# Patient Record
Sex: Female | Born: 1945
Health system: Southern US, Community
[De-identification: ages and names within clinical notes are randomized; demographics above are authoritative.]

## PROBLEM LIST (undated history)

## (undated) DIAGNOSIS — Z860100 Personal history of colon polyps, unspecified: Secondary | ICD-10-CM

## (undated) DIAGNOSIS — Z87448 Personal history of other diseases of urinary system: Secondary | ICD-10-CM

## (undated) DIAGNOSIS — F419 Anxiety disorder, unspecified: Secondary | ICD-10-CM

## (undated) DIAGNOSIS — I10 Essential (primary) hypertension: Secondary | ICD-10-CM

## (undated) DIAGNOSIS — E785 Hyperlipidemia, unspecified: Secondary | ICD-10-CM

## (undated) HISTORY — PX: DILATION AND CURETTAGE OF UTERUS: SHX78

## (undated) HISTORY — PX: EYE SURGERY: SHX253

## (undated) HISTORY — DX: Personal history of colon polyps, unspecified: Z86.0100

## (undated) HISTORY — DX: Anxiety disorder, unspecified: F41.9

## (undated) HISTORY — PX: TONSILLECTOMY: SHX5217

## (undated) HISTORY — DX: Personal history of colonic polyps: Z86.010

## (undated) HISTORY — DX: Personal history of other diseases of urinary system: Z87.448

## (undated) HISTORY — DX: Hyperlipidemia, unspecified: E78.5

---

## 1999-12-13 ENCOUNTER — Encounter: Payer: Self-pay | Admitting: Family Medicine

## 1999-12-13 ENCOUNTER — Encounter: Admission: RE | Admit: 1999-12-13 | Discharge: 1999-12-13 | Payer: Self-pay | Admitting: Family Medicine

## 2000-12-12 ENCOUNTER — Other Ambulatory Visit: Admission: RE | Admit: 2000-12-12 | Discharge: 2000-12-12 | Payer: Self-pay | Admitting: Family Medicine

## 2002-01-13 ENCOUNTER — Encounter: Admission: RE | Admit: 2002-01-13 | Discharge: 2002-01-13 | Payer: Self-pay | Admitting: Family Medicine

## 2002-01-13 ENCOUNTER — Encounter: Payer: Self-pay | Admitting: Family Medicine

## 2003-03-25 ENCOUNTER — Encounter: Payer: Self-pay | Admitting: Family Medicine

## 2003-03-25 ENCOUNTER — Encounter: Admission: RE | Admit: 2003-03-25 | Discharge: 2003-03-25 | Payer: Self-pay | Admitting: Family Medicine

## 2003-05-03 ENCOUNTER — Other Ambulatory Visit: Admission: RE | Admit: 2003-05-03 | Discharge: 2003-05-03 | Payer: Self-pay | Admitting: Family Medicine

## 2004-07-09 ENCOUNTER — Encounter: Admission: RE | Admit: 2004-07-09 | Discharge: 2004-07-09 | Payer: Self-pay | Admitting: Family Medicine

## 2004-07-18 LAB — HM PAP SMEAR: HM Pap smear: NORMAL

## 2004-07-28 ENCOUNTER — Encounter: Admission: RE | Admit: 2004-07-28 | Discharge: 2004-07-28 | Payer: Self-pay | Admitting: Family Medicine

## 2005-06-22 ENCOUNTER — Emergency Department (HOSPITAL_COMMUNITY): Admission: EM | Admit: 2005-06-22 | Discharge: 2005-06-22 | Payer: Self-pay | Admitting: Emergency Medicine

## 2006-04-02 HISTORY — PX: APPENDECTOMY: SHX54

## 2006-04-08 ENCOUNTER — Inpatient Hospital Stay (HOSPITAL_COMMUNITY): Admission: EM | Admit: 2006-04-08 | Discharge: 2006-04-09 | Payer: Self-pay | Admitting: Emergency Medicine

## 2011-12-11 ENCOUNTER — Encounter (HOSPITAL_COMMUNITY): Payer: Self-pay | Admitting: *Deleted

## 2011-12-11 ENCOUNTER — Emergency Department (HOSPITAL_COMMUNITY)
Admission: EM | Admit: 2011-12-11 | Discharge: 2011-12-11 | Disposition: A | Discharge status: Home / Self Care | Payer: Medicare Other | Attending: Emergency Medicine | Admitting: Emergency Medicine

## 2011-12-11 ENCOUNTER — Other Ambulatory Visit: Payer: Self-pay

## 2011-12-11 ENCOUNTER — Emergency Department (HOSPITAL_COMMUNITY): Payer: Medicare Other

## 2011-12-11 DIAGNOSIS — Z79899 Other long term (current) drug therapy: Secondary | ICD-10-CM | POA: Insufficient documentation

## 2011-12-11 DIAGNOSIS — R209 Unspecified disturbances of skin sensation: Secondary | ICD-10-CM | POA: Insufficient documentation

## 2011-12-11 DIAGNOSIS — I10 Essential (primary) hypertension: Secondary | ICD-10-CM | POA: Insufficient documentation

## 2011-12-11 DIAGNOSIS — J329 Chronic sinusitis, unspecified: Secondary | ICD-10-CM | POA: Insufficient documentation

## 2011-12-11 HISTORY — DX: Essential (primary) hypertension: I10

## 2011-12-11 LAB — URINALYSIS, ROUTINE W REFLEX MICROSCOPIC
Bilirubin Urine: NEGATIVE
Ketones, ur: NEGATIVE mg/dL
Nitrite: NEGATIVE
Specific Gravity, Urine: 1.006 (ref 1.005–1.030)
Urobilinogen, UA: 0.2 mg/dL (ref 0.0–1.0)

## 2011-12-11 LAB — DIFFERENTIAL
Basophils Relative: 1 % (ref 0–1)
Eosinophils Absolute: 0.1 10*3/uL (ref 0.0–0.7)
Eosinophils Relative: 1 % (ref 0–5)
Lymphs Abs: 1.8 10*3/uL (ref 0.7–4.0)
Monocytes Absolute: 0.4 10*3/uL (ref 0.1–1.0)
Monocytes Relative: 5 % (ref 3–12)
Neutrophils Relative %: 71 % (ref 43–77)

## 2011-12-11 LAB — CBC
HCT: 40.4 % (ref 36.0–46.0)
Hemoglobin: 13.2 g/dL (ref 12.0–15.0)
MCH: 28.5 pg (ref 26.0–34.0)
MCHC: 32.7 g/dL (ref 30.0–36.0)
MCV: 87.3 fL (ref 78.0–100.0)
RBC: 4.63 MIL/uL (ref 3.87–5.11)

## 2011-12-11 LAB — BASIC METABOLIC PANEL
BUN: 13 mg/dL (ref 6–23)
Calcium: 9.5 mg/dL (ref 8.4–10.5)
Creatinine, Ser: 0.85 mg/dL (ref 0.50–1.10)
GFR calc Af Amer: 81 mL/min — ABNORMAL LOW (ref >90)
GFR calc non Af Amer: 70 mL/min — ABNORMAL LOW (ref >90)
Glucose, Bld: 119 mg/dL — ABNORMAL HIGH (ref 70–99)

## 2011-12-11 LAB — CARDIAC PANEL(CRET KIN+CKTOT+MB+TROPI): CK, MB: 1.6 ng/mL (ref 0.3–4.0)

## 2011-12-11 LAB — PROTIME-INR
INR: 0.95 (ref 0.00–1.49)
Prothrombin Time: 12.9 seconds (ref 11.6–15.2)

## 2011-12-11 LAB — APTT: aPTT: 41 seconds — ABNORMAL HIGH (ref 24–37)

## 2011-12-11 LAB — URINE MICROSCOPIC-ADD ON

## 2011-12-11 MED ORDER — LABETALOL HCL 5 MG/ML IV SOLN
10.0000 mg | Freq: Once | INTRAVENOUS | Status: DC
  Filled 2011-12-11: qty 4

## 2011-12-11 MED ORDER — LABETALOL HCL 5 MG/ML IV SOLN
20.0000 mg | Freq: Once | INTRAVENOUS | Status: AC
  Administered 2011-12-11: 20 mg via INTRAVENOUS
  Filled 2011-12-11: qty 4

## 2011-12-11 MED ORDER — FLUTICASONE PROPIONATE 50 MCG/ACT NA SUSP: 2.0000 | Freq: Every day | NASAL | Status: DC

## 2011-12-11 MED ORDER — AZITHROMYCIN 250 MG PO TABS: 250.0000 mg | ORAL_TABLET | Freq: Every day | ORAL | Status: AC

## 2011-12-11 MED ORDER — SODIUM CHLORIDE 0.9 % IV SOLN
Freq: Once | INTRAVENOUS | Status: AC
  Administered 2011-12-11: 11:00:00 via INTRAVENOUS

## 2011-12-11 MED ORDER — GADOBENATE DIMEGLUMINE 529 MG/ML IV SOLN
15.0000 mL | Freq: Once | INTRAVENOUS | Status: AC | PRN
  Administered 2011-12-11: 15 mL via INTRAVENOUS

## 2011-12-11 MED ORDER — MOMETASONE FUROATE 50 MCG/ACT NA SUSP: 2.0000 | Freq: Every day | NASAL | Status: DC

## 2011-12-11 MED ORDER — LISINOPRIL 20 MG PO TABS: 10.0000 mg | ORAL_TABLET | Freq: Every day | ORAL | Status: DC

## 2011-12-11 NOTE — ED Notes (Signed)
Pt states woke up this am with "fogginess", states "I feel funny"; pt states went to work and they checked her b/p and it was 212/122, no facial drooping noted, pt ambulatory, no other complaints noted. Pt states has been off her b/p meds for 18yrs.

## 2011-12-11 NOTE — ED Notes (Signed)
Waiting 10 minutes before sending pt home to record response to medication. Pt agreeable to plan.

## 2011-12-11 NOTE — ED Provider Notes (Signed)
History     CSN: [id number]  Arrival date & time 12/11/11  8295   First MD Initiated Contact with Patient 12/11/11 [phone]      Chief Complaint  Patient presents with  . Hypertension    212/122    (Consider location/radiation/quality/duration/timing/severity/associated sxs/prior treatment) HPI Pt states that on her way to work she began to feel unwell. States she felt "foggy" or "spacey". She noticed that at 7 am she had paraesthesias in L hand and then in left foot. No focal weakness, dizziness, N/V, HA, fever, neck pain, CP, SOB. Paraesthesias have resolved in foot and only very mild sensory difference noted in hand. Pt states she stopped taking her BP meds for previously diagnosed HTN 6 years aog and does not remember the name of the medicine.  Past Medical History  Diagnosis Date  . Hypertension     Past Surgical History  Procedure Date  . Appendectomy     No family history on file.  History  Substance Use Topics  . Smoking status: Former Games developer  . Smokeless tobacco: Not on file  . Alcohol Use: Yes     socially    OB History    Grav Para Term Preterm Abortions TAB SAB Ect Mult Living                  Review of Systems  Constitutional: Negative for fever and chills.  HENT: Negative for neck pain.   Eyes: Negative for visual disturbance.  Respiratory: Negative for chest tightness and shortness of breath.   Cardiovascular: Negative for chest pain and palpitations.  Gastrointestinal: Negative for nausea, vomiting, abdominal pain and diarrhea.  Skin: Negative for rash and wound.  Neurological: Positive for numbness. Negative for dizziness, tremors, seizures, syncope, facial asymmetry, speech difficulty, weakness, light-headedness and headaches.    Allergies  Review of patient's allergies indicates no known allergies.  Home Medications   Current Outpatient Rx  Name Route Sig Dispense Refill  . AZITHROMYCIN 250 MG PO TABS Oral Take 1 tablet (250 mg total) by  mouth daily. Take first 2 tablets together, then 1 every day until finished. 6 tablet 0  . FLUTICASONE PROPIONATE 50 MCG/ACT NA SUSP Nasal Place 2 sprays into the nose daily. 16 g 2  . LISINOPRIL 20 MG PO TABS Oral Take 0.5 tablets (10 mg total) by mouth daily. 30 tablet 0  . MOMETASONE FUROATE 50 MCG/ACT NA SUSP Nasal Place 2 sprays into the nose daily. 17 g 12    BP 210/103  Pulse 65  Temp(Src) 98.2 F (36.8 C) (Oral)  Resp 19  SpO2 97%  Physical Exam  Nursing note and vitals reviewed. Constitutional: She is oriented to person, place, and time. She appears well-developed and well-nourished. No distress.  HENT:  Head: Normocephalic and atraumatic.  Mouth/Throat: Oropharynx is clear and moist.  Eyes: EOM are normal. Pupils are equal, round, and reactive to light.  Neck: Normal range of motion. Neck supple.  Cardiovascular: Normal rate and regular rhythm.   Pulmonary/Chest: Effort normal and breath sounds normal. No respiratory distress. She has no wheezes. She has no rales.  Abdominal: Soft. Bowel sounds are normal. There is no tenderness. There is no rebound and no guarding.  Musculoskeletal: Normal range of motion. She exhibits no edema and no tenderness.  Neurological: She is alert and oriented to person, place, and time.       5/5 motor in all ext. Mildly decreased sensation in L hand compared to R hand. No  other sensory deficits. Finger to nose intact. CN II-XII intact.   Skin: Skin is warm and dry. No rash noted. No erythema.  Psychiatric: She has a normal mood and affect. Her behavior is normal.    ED Course  Procedures (including critical care time)  Labs Reviewed  BASIC METABOLIC PANEL - Abnormal; Notable for the following:    Glucose, Bld 119 (*)    GFR calc non Af Amer 70 (*)    GFR calc Af Amer 81 (*)    All other components within normal limits  URINALYSIS, ROUTINE W REFLEX MICROSCOPIC - Abnormal; Notable for the following:    Hgb urine dipstick MODERATE (*)     Protein, ur 100 (*)    All other components within normal limits  APTT - Abnormal; Notable for the following:    aPTT 41 (*)    All other components within normal limits  URINE MICROSCOPIC-ADD ON - Abnormal; Notable for the following:    Squamous Epithelial / LPF FEW (*)    All other components within normal limits  CBC  DIFFERENTIAL  CARDIAC PANEL(CRET KIN+CKTOT+MB+TROPI)  PROTIME-INR   Ct Head Wo Contrast  12/11/2011  *RADIOLOGY REPORT*  Clinical Data: Hand numbness.  History of hypertension.  CT HEAD WITHOUT CONTRAST  Technique:  Contiguous axial images were obtained from the base of the skull through the vertex without contrast.  Comparison: No priors.  Findings: No acute intracranial abnormalities.  Specifically, no focal mass, mass effect, hydrocephalus or abnormal intra or extra- axial fluid collections.  No displaced skull fractures are identified.  Visualized paranasal sinuses and mastoids are well pneumatized, with the exception of a small amount of mucosal thickening in the ethmoid sinuses bilaterally, and tiny air fluid levels in the dependent portion of the right maxillary and left sphenoid sinuses.  IMPRESSION: 1.  No acute intracranial abnormalities. 2.  Small amount of mucosal thickening in the ethmoid sinuses and tiny air fluid levels in the right maxillary and left sphenoid sinus.  Clinical correlation for signs of symptoms of acute sinusitis may be warranted.  Original Report Authenticated By: Florencia Reasons, M.D.   Mr Laqueta Jean Wo Contrast  12/11/2011  *RADIOLOGY REPORT*  Clinical Data: 66 year old female with hypertension, left hand numbness.  MRI HEAD WITHOUT AND WITH CONTRAST  Technique:  Multiplanar, multiecho pulse sequences of the brain and surrounding structures were obtained according to standard protocol without and with intravenous contrast  Contrast: 15mL MULTIHANCE GADOBENATE DIMEGLUMINE 529 MG/ML IV SOLN  Comparison: Head CT without contrast 12/11/2011.  Findings:  No restricted diffusion to suggest acute infarction.  No midline shift, mass effect, evidence of mass lesion, ventriculomegaly, extra-axial collection or acute intracranial hemorrhage.  Cervicomedullary junction and pituitary are within normal limits.  Major intracranial vascular flow voids are preserved.  Numerous small scattered foci of T2 and FLAIR hyperintensity in the cerebral white matter, mostly subcortical. No abnormal enhancement identified.  Negative visualized cervical spine.  The deep gray matter nuclei, brainstem, and cerebellum are within normal limits.  Postoperative changes to the globes, otherwise negative orbit soft tissues.  The mastoids are clear.  Scattered paranasal sinus fluid levels and mucosal thickening.  Normal bone marrow signal. Negative scalp soft tissues.  IMPRESSION: 1. No acute intracranial abnormality. 2.  Age advanced but nonspecific cerebral white matter signal changes without enhancement.  Favor chronic small vessel disease. 3.  Paranasal sinus inflammatory changes with fluid levels suggesting acute sinusitis.  Original Report Authenticated By: Harley Hallmark, M.D.  1. Uncontrolled hypertension   2. Sinusitis      Date: 12/11/2011  Rate: 79  Rhythm: normal sinus rhythm  QRS Axis: normal  Intervals: normal  ST/T Wave abnormalities: normal  Conduction Disutrbances:none  Narrative Interpretation:   Old EKG Reviewed: unchanged    MDM   Discussed with Dr Lyman Speller. If MRI normal OK to D/C home and f/u with PMD     MRI brain with no acute findings. Sinusitis present. Pt feeling well. Laughing in room. Will start low dose lisinopril and have f/u with PMD to have blood pressure checked and adjust medications. Pt instructed to return immediately for worsening symptoms or any concerns   Loren Racer, MD 12/11/11 1542

## 2011-12-11 NOTE — ED Notes (Signed)
Patient transported to MRI 

## 2011-12-11 NOTE — ED Notes (Signed)
Unable to obtain labs with IV start. Phlebotomy at bedside.

## 2011-12-11 NOTE — Discharge Instructions (Signed)
Call and make an appointment with your Primary MD to follow up on your blood pressure and adjust medications as needed. Return immediately for headaches, weakness, uncontrolled vomiting or any concerns.   Hypertension Information As your heart beats, it forces blood through your arteries. This force is your blood pressure. If the pressure is too high, it is called hypertension (HTN) or high blood pressure. HTN is dangerous because you may have it and not know it. High blood pressure may mean that your heart has to work harder to pump blood. Your arteries may be narrow or stiff. The extra work puts you at risk for heart disease, stroke, and other problems.  Blood pressure consists of two numbers, a higher number over a lower, 110/72, for example. It is stated as "110 over 72." The ideal is below 120 for the top number (systolic) and under 80 for the bottom (diastolic).  You should pay close attention to your blood pressure if you have certain conditions such as:  Heart failure.   Prior heart attack.   Diabetes   Chronic kidney disease.   Prior stroke.   Multiple risk factors for heart disease.  To see if you have HTN, your blood pressure should be measured while you are seated with your arm held at the level of the heart. It should be measured at least twice. A one-time elevated blood pressure reading (especially in the Emergency Department) does not mean that you need treatment. There may be conditions in which the blood pressure is different between your right and left arms. It is important to see your caregiver soon for a recheck. Most people have essential hypertension which means that there is not a specific cause. This type of high blood pressure may be lowered by changing lifestyle factors such as:  Stress.   Smoking.   Lack of exercise.   Excessive weight.   Drug/tobacco/alcohol use.   Eating less salt.  Most people do not have symptoms from high blood pressure until it has  caused damage to the body. Effective treatment can often prevent, delay or reduce that damage. TREATMENT  Treatment for high blood pressure, when a cause has been identified, is directed at the cause. There are a large number of medications to treat HTN. These fall into several categories, and your caregiver will help you select the medicines that are best for you. Medications may have side effects. You should review side effects with your caregiver. If your blood pressure stays high after you have made lifestyle changes or started on medicines,   Your medication(s) may need to be changed.   Other problems may need to be addressed.   Be certain you understand your prescriptions, and know how and when to take your medicine.   Be sure to follow up with your caregiver within the time frame advised (usually within two weeks) to have your blood pressure rechecked and to review your medications.   If you are taking more than one medicine to lower your blood pressure, make sure you know how and at what times they should be taken. Taking two medicines at the same time can result in blood pressure that is too low.  Document Released: 10/22/2005 Document Revised: 05/01/2011 Document Reviewed: 10/29/2007 [facility] Patient Information 2012 [place], [place].  Sinusitis Sinuses are air pockets within the bones of your face. The growth of bacteria within a sinus leads to infection. The infection prevents the sinuses from draining. This infection is called sinusitis. SYMPTOMS  There will  be different areas of pain depending on which sinuses have become infected.  The maxillary sinuses often produce pain beneath the eyes.   Frontal sinusitis may cause pain in the middle of the forehead and above the eyes.  Other problems (symptoms) include:  Toothaches.   Colored, pus-like (purulent) drainage from the nose.   Swelling, warmth, and tenderness over the sinus areas may be signs of infection.  TREATMENT    Sinusitis is most often determined by an exam.X-rays may be taken. If x-rays have been taken, make sure you obtain your results or find out how you are to obtain them. Your caregiver may give you medications (antibiotics). These are medications that will help kill the bacteria causing the infection. You may also be given a medication (decongestant) that helps to reduce sinus swelling.  HOME CARE INSTRUCTIONS   Only take over-the-counter or prescription medicines for pain, discomfort, or fever as directed by your caregiver.   Drink extra fluids. Fluids help thin the mucus so your sinuses can drain more easily.   Applying either moist heat or ice packs to the sinus areas may help relieve discomfort.   Use saline nasal sprays to help moisten your sinuses. The sprays can be found at your local drugstore.  SEEK IMMEDIATE MEDICAL CARE IF:  You have a fever.   You have increasing pain, severe headaches, or toothache.   You have nausea, vomiting, or drowsiness.   You develop unusual swelling around the face or trouble seeing.  MAKE SURE YOU:   Understand these instructions.   Will watch your condition.   Will get help right away if you are not doing well or get worse.  Document Released: 08/19/2005 Document Revised: 08/08/2011 Document Reviewed: 03/18/2007 [facility] Patient Information 2012 [place], [place].

## 2012-01-02 ENCOUNTER — Other Ambulatory Visit: Payer: Self-pay | Admitting: *Deleted

## 2012-01-02 DIAGNOSIS — G459 Transient cerebral ischemic attack, unspecified: Secondary | ICD-10-CM

## 2012-01-03 ENCOUNTER — Encounter (INDEPENDENT_AMBULATORY_CARE_PROVIDER_SITE_OTHER): Payer: BC Managed Care – PPO

## 2012-01-03 DIAGNOSIS — R209 Unspecified disturbances of skin sensation: Secondary | ICD-10-CM

## 2012-01-03 DIAGNOSIS — G459 Transient cerebral ischemic attack, unspecified: Secondary | ICD-10-CM

## 2012-03-24 ENCOUNTER — Other Ambulatory Visit: Payer: Self-pay | Admitting: Gastroenterology

## 2012-11-19 ENCOUNTER — Other Ambulatory Visit (INDEPENDENT_AMBULATORY_CARE_PROVIDER_SITE_OTHER): Payer: Medicare Other

## 2012-11-19 ENCOUNTER — Ambulatory Visit: Payer: Self-pay | Admitting: Physician Assistant

## 2012-11-19 DIAGNOSIS — Z79899 Other long term (current) drug therapy: Secondary | ICD-10-CM

## 2012-11-19 DIAGNOSIS — I10 Essential (primary) hypertension: Secondary | ICD-10-CM

## 2012-11-19 DIAGNOSIS — E559 Vitamin D deficiency, unspecified: Secondary | ICD-10-CM

## 2012-11-19 DIAGNOSIS — E782 Mixed hyperlipidemia: Secondary | ICD-10-CM

## 2012-11-19 LAB — CBC WITH DIFFERENTIAL/PLATELET
Basophils Absolute: 0.1 10*3/uL (ref 0.0–0.1)
Basophils Relative: 1 % (ref 0–1)
Eosinophils Absolute: 0.3 10*3/uL (ref 0.0–0.7)
Eosinophils Relative: 4 % (ref 0–5)
MCH: 27.3 pg (ref 26.0–34.0)
MCHC: 33.5 g/dL (ref 30.0–36.0)
MCV: 81.5 fL (ref 78.0–100.0)
Neutrophils Relative %: 56 % (ref 43–77)
Platelets: 402 10*3/uL — ABNORMAL HIGH (ref 150–400)
RDW: 15.4 % (ref 11.5–15.5)

## 2012-11-19 LAB — COMPREHENSIVE METABOLIC PANEL
AST: 13 U/L (ref 0–37)
BUN: 23 mg/dL (ref 6–23)
CO2: 26 mEq/L (ref 19–32)
Calcium: 10.2 mg/dL (ref 8.4–10.5)
Chloride: 104 mEq/L (ref 96–112)
Creat: 1.14 mg/dL — ABNORMAL HIGH (ref 0.50–1.10)
Total Bilirubin: 0.4 mg/dL (ref 0.3–1.2)

## 2012-11-19 LAB — LIPID PANEL
Cholesterol: 172 mg/dL (ref 0–200)
HDL: 45 mg/dL (ref >39)
Total CHOL/HDL Ratio: 3.8 Ratio
VLDL: 58 mg/dL — ABNORMAL HIGH (ref 0–40)

## 2012-11-24 ENCOUNTER — Encounter: Payer: Self-pay | Admitting: Family Medicine

## 2012-11-24 DIAGNOSIS — G459 Transient cerebral ischemic attack, unspecified: Secondary | ICD-10-CM | POA: Insufficient documentation

## 2012-11-24 DIAGNOSIS — E559 Vitamin D deficiency, unspecified: Secondary | ICD-10-CM | POA: Insufficient documentation

## 2012-11-24 DIAGNOSIS — Z86010 Personal history of colonic polyps: Secondary | ICD-10-CM | POA: Insufficient documentation

## 2012-11-24 DIAGNOSIS — E785 Hyperlipidemia, unspecified: Secondary | ICD-10-CM | POA: Insufficient documentation

## 2012-11-24 DIAGNOSIS — Z87448 Personal history of other diseases of urinary system: Secondary | ICD-10-CM | POA: Insufficient documentation

## 2012-11-24 DIAGNOSIS — I10 Essential (primary) hypertension: Secondary | ICD-10-CM | POA: Insufficient documentation

## 2012-11-24 DIAGNOSIS — F410 Panic disorder [episodic paroxysmal anxiety] without agoraphobia: Secondary | ICD-10-CM | POA: Insufficient documentation

## 2012-11-25 ENCOUNTER — Ambulatory Visit (INDEPENDENT_AMBULATORY_CARE_PROVIDER_SITE_OTHER): Payer: 59 | Admitting: Physician Assistant

## 2012-11-25 ENCOUNTER — Encounter: Payer: Self-pay | Admitting: Physician Assistant

## 2012-11-25 VITALS — BP 142/76 | HR 80 | Temp 97.9°F | Resp 18 | Ht 63.5 in | Wt 172.0 lb

## 2012-11-25 DIAGNOSIS — A499 Bacterial infection, unspecified: Secondary | ICD-10-CM

## 2012-11-25 DIAGNOSIS — E785 Hyperlipidemia, unspecified: Secondary | ICD-10-CM

## 2012-11-25 DIAGNOSIS — R739 Hyperglycemia, unspecified: Secondary | ICD-10-CM

## 2012-11-25 DIAGNOSIS — E559 Vitamin D deficiency, unspecified: Secondary | ICD-10-CM

## 2012-11-25 DIAGNOSIS — J069 Acute upper respiratory infection, unspecified: Secondary | ICD-10-CM

## 2012-11-25 DIAGNOSIS — I10 Essential (primary) hypertension: Secondary | ICD-10-CM

## 2012-11-25 DIAGNOSIS — R7309 Other abnormal glucose: Secondary | ICD-10-CM

## 2012-11-25 DIAGNOSIS — B9689 Other specified bacterial agents as the cause of diseases classified elsewhere: Secondary | ICD-10-CM

## 2012-11-25 DIAGNOSIS — G459 Transient cerebral ischemic attack, unspecified: Secondary | ICD-10-CM

## 2012-11-25 MED ORDER — AMOXICILLIN-POT CLAVULANATE 875-125 MG PO TABS: 1.0000 | ORAL_TABLET | Freq: Two times a day (BID) | ORAL | Status: DC

## 2012-11-25 NOTE — Progress Notes (Signed)
Patient ID: Sherry Kent MRN: [id number], DOB: [birth year], 67 y.o. Date of Encounter: @DATE @  Chief Complaint:  Chief Complaint  Patient presents with  . f/u for med refills  had labs last week    HPI: 68 y.o. year old female  presents for routine F/U. Also, reports nasal congestion.  1. URI: For 2-3 weeks, when wakes in morning has nasal congeston. Showers... This loosens mucus. Blows out thick dark yellow/green mucus.  O/w during day does not get out much mucus. No chest cong, ST, F/C.  2. HTN: Taking all meds as directed. No LE Edema or adverse effects.  3. HLD: Taking Crestor as directed. No myalgias.  4. Vit. D Def: Is taking 2,000 IU QD.  Results for orders placed in visit on 11/24/12  HM MAMMOGRAPHY      Result Value Range   HM Mammogram normal    HM DEXA SCAN      Result Value Range   HM Dexa Scan osteopenia    HM PAP SMEAR      Result Value Range   HM Pap smear normal    HM COLONOSCOPY      Result Value Range   HM Colonoscopy Dr Sherin Quarry         Past Medical History  Diagnosis Date  . Hypertension   . Hyperlipidemia   . Anxiety   . History of colon polyps   . Vitamin D deficiency   . H/O hematuria   . Hx-TIA (transient ischemic attack)      Home Meds: Current Outpatient Prescriptions on File Prior to Visit  Medication Sig Dispense Refill  . amLODipine (NORVASC) 5 MG tablet Take 5 mg by mouth daily.      . Cholecalciferol (VITAMIN D3) 2000 UNITS TABS Take 2,000 Int'l Units by mouth daily.      . hydrochlorothiazide (HYDRODIURIL) 25 MG tablet Take 25 mg by mouth daily.      Marland Kitchen lisinopril (PRINIVIL,ZESTRIL) 20 MG tablet Take 0.5 tablets (10 mg total) by mouth daily.  30 tablet  0  . rosuvastatin (CRESTOR) 20 MG tablet Take 20 mg by mouth daily.      . fluticasone (FLONASE) 50 MCG/ACT nasal spray Place 2 sprays into the nose daily.  16 g  2  . mometasone (NASONEX) 50 MCG/ACT nasal spray Place 2 sprays into the nose daily.  17 g  12   No current  facility-administered medications on file prior to visit.    Allergies: No Known Allergies  History   Social History  . Marital Status: Married    Spouse Name: N/A    Number of Children: N/A  . Years of Education: N/A   Occupational History  . Not on file.   Social History Main Topics  . Smoking status: Former Games developer  . Smokeless tobacco: Never Used  . Alcohol Use: Yes     Comment: socially  . Drug Use: No  . Sexually Active: Not on file   Other Topics Concern  . Not on file   Social History Narrative  . No narrative on file    Family History  Problem Relation Age of Onset  . Heart disease Father   . Cancer Brother     colon     Review of Systems: Constitutional: negative for chills, fever, night sweats, weight changes, or fatigue  HEENT: negative for vision changes, hearing loss, rhinorrhea, ST, epistaxis, or sinus pressure Cardiovascular: negative for chest pain or palpitations. No new/increased shortness  of breath or dyspnea on exertion Respiratory: negative for hemoptysis, wheezing, shortness of breath, or cough Abdominal: negative for abdominal pain, nausea, vomiting, diarrhea, or constipation Dermatological: negative for rash or concerning skin lesions Neurologic: negative for headache, dizziness, or syncope All other systems reviewed and are otherwise negative with the exception to those above and in the HPI.   Physical Exam: Blood pressure 142/76, pulse 80, temperature 97.9 F (36.6 C), temperature source Oral, resp. rate 18, height 5' 3.5" (1.613 m), weight 172 lb (78.019 kg)., Body mass index is 29.99 kg/(m^2). General: Moderate obese WF. in no acute distress. Head: Normocephalic, atraumatic, eyes without discharge, sclera non-icteric, nares are without discharge. Bilateral auditory canals clear, TM's are without perforation, pearly grey and translucent with reflective cone of light bilaterally. Oral cavity moist, posterior pharynx without exudate,  erythema, peritonsillar abscess, or post nasal drip.Sinuses with no tenderness with percussion.  Neck: Supple. No thyromegaly. Full ROM. No lymphadenopathy. Lungs: Clear bilaterally to auscultation without wheezes, rales, or rhonchi. Breathing is unlabored. Heart: RRR with S1 S2. No murmurs, rubs, or gallops. Abdomen: Soft, non-tender, non-distended with normoactive bowel sounds. No hepatomegaly. No rebound/guarding. No obvious abdominal masses. Musculoskeletal:  Strength and tone normal for age. Extremities/Skin: Warm and dry. No clubbing or cyanosis. No edema. No rashes or suspicious lesions. Neuro: Alert and oriented X 3. Moves all extremities spontaneously. Gait is normal. CNII-XII grossly in tact. Psych:  Responds to questions appropriately with a normal affect.   Labs:   ASSESSMENT AND PLAN:  67 y.o. year old female with  1. Bacterial upper respiratory infection  - amoxicillin-clavulanate (AUGMENTIN) 875-125 MG per tablet; Take 1 tablet by mouth 2 (two) times daily.  Dispense: 20 tablet; Refill: 0 Cont Nasonex prn.  2. Hypertension BP slightly high today but has been at goal last OV. Cont current meds now and monitor. CMET last week nml.  3. Hyperlipidemia LDL and HDL at goal. Cont Crestor at current dose. Discussed high trigs.Marland Kitchendecrease carbs as below  4. Hyperglycemia Gave and reviewed Carb Counting Handout at length today. She says she eats a lot of the items on that list and can definitely make a lot of changes. At next lab will check A1C.  5. TIA (transient ischemic attack)   6. Vitamin D deficiency Level WNL on current dose at lab last week. Cont current dose of 200 IU QD.  7. DEXA/BMD:Last was 2005. Discussed f/u @ LOV. But today pt says she never had f/u. Encouraged her to f/u today.  8. Mammogram: Last was >10 years ago. Discussed need to f/u with pt. Defers referral at this time.  9. Colonoscopy: 03/24/12..Polyp..repeat 5 years  10. Zostavax: Told pt to check  cost with insurance a LOV. She says she called but insurance co never returned her call.   ROV 6 months.  Signed, [address] [place], [place], [facility] 11/25/2012 2:50 PM

## 2013-01-20 ENCOUNTER — Other Ambulatory Visit: Payer: Self-pay | Admitting: Physician Assistant

## 2013-01-20 NOTE — Telephone Encounter (Signed)
Medication refilled per protocol. 

## 2013-01-26 ENCOUNTER — Encounter: Payer: Self-pay | Admitting: Physician Assistant

## 2013-05-27 ENCOUNTER — Encounter: Payer: Self-pay | Admitting: Physician Assistant

## 2013-05-27 ENCOUNTER — Ambulatory Visit (INDEPENDENT_AMBULATORY_CARE_PROVIDER_SITE_OTHER): Payer: 59 | Admitting: Physician Assistant

## 2013-05-27 VITALS — BP 140/80 | HR 72 | Temp 98.3°F | Resp 18 | Ht 63.0 in | Wt 173.0 lb

## 2013-05-27 DIAGNOSIS — R7309 Other abnormal glucose: Secondary | ICD-10-CM

## 2013-05-27 DIAGNOSIS — F329 Major depressive disorder, single episode, unspecified: Secondary | ICD-10-CM

## 2013-05-27 DIAGNOSIS — R7302 Impaired glucose tolerance (oral): Secondary | ICD-10-CM | POA: Insufficient documentation

## 2013-05-27 DIAGNOSIS — Z86010 Personal history of colonic polyps: Secondary | ICD-10-CM

## 2013-05-27 DIAGNOSIS — E785 Hyperlipidemia, unspecified: Secondary | ICD-10-CM

## 2013-05-27 DIAGNOSIS — I10 Essential (primary) hypertension: Secondary | ICD-10-CM

## 2013-05-27 DIAGNOSIS — E559 Vitamin D deficiency, unspecified: Secondary | ICD-10-CM

## 2013-05-27 DIAGNOSIS — F32A Depression, unspecified: Secondary | ICD-10-CM | POA: Insufficient documentation

## 2013-05-27 DIAGNOSIS — G459 Transient cerebral ischemic attack, unspecified: Secondary | ICD-10-CM

## 2013-05-27 DIAGNOSIS — R739 Hyperglycemia, unspecified: Secondary | ICD-10-CM

## 2013-05-27 DIAGNOSIS — R7303 Prediabetes: Secondary | ICD-10-CM | POA: Insufficient documentation

## 2013-05-27 LAB — LIPID PANEL
Cholesterol: 182 mg/dL (ref 0–200)
LDL Cholesterol: 92 mg/dL (ref 0–99)
VLDL: 42 mg/dL — ABNORMAL HIGH (ref 0–40)

## 2013-05-27 LAB — COMPLETE METABOLIC PANEL WITH GFR
AST: 12 U/L (ref 0–37)
Albumin: 4.6 g/dL (ref 3.5–5.2)
BUN: 27 mg/dL — ABNORMAL HIGH (ref 6–23)
CO2: 29 mEq/L (ref 19–32)
Calcium: 10.3 mg/dL (ref 8.4–10.5)
Chloride: 105 mEq/L (ref 96–112)
Glucose, Bld: 113 mg/dL — ABNORMAL HIGH (ref 70–99)
Potassium: 4.8 mEq/L (ref 3.5–5.3)

## 2013-05-27 LAB — HEMOGLOBIN A1C: Mean Plasma Glucose: 126 mg/dL — ABNORMAL HIGH (ref <117)

## 2013-05-27 MED ORDER — SERTRALINE HCL 50 MG PO TABS: 50.0000 mg | ORAL_TABLET | Freq: Every day | ORAL | Status: DC

## 2013-05-27 NOTE — Progress Notes (Signed)
Patient ID: Sherry Kent MRN: [id number], DOB: 1945/10/29, 67 y.o. Date of Encounter: @DATE @  Chief Complaint:  Chief Complaint  Patient presents with  . 6 mth check up    is fasting, no meds yet today    HPI: 67 y.o. year old white female  presents for routine followup office visit. Her last office visit with me was 6 months ago on 11/25/12.  Today we actually did a depression screen which revealed that she has been having problems with depression but she just has not dealt with until today. When she was directly asked these questions she became tearful.  With further discussion she says that for several years she actually has been battling with this. She has no children. Her husband passed away 8 years ago. She retired 2 years ago. However she then returned back to her job part time. 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  She says that she just finds no motivation and no interest in things that she used to be involved with. Even regarding medical issues she has had the same problem. For example I discussed that at her last visit we had reviewed her carbohydrate handout regarding hyperglycemia. At that visit she told me she was eating a lot there is carbohydrate foods into different make a lot of change. However today she tells me that she has made no changes. Says that she comes home from work and feels hungry and eats whatever she wants. As well I reviewed her last office notes regarding scheduling bone density and mammogram test. These are both way overdue. She says that she did not schedule these. Says that this is just like everything else and she just did not have the motivation or interest to get this done. Says that this is not the way she used to be. She used to be very active and involved in things. Just over the last few years has not been interested and motivated. Says that she was raised to just deal with these things. Says that she probably has had some issues ever since her husband  passed away but just never dealt with them. Then when she tried to retire she really had difficulty. No suicidal or homicidal ideation.  She is taking her blood pressure medicines as directed. No adverse effects. No lightheadedness and no lower extremity edema.  She is taking her cholesterol medicine as directed. No myalgias and no other adverse effects.  Past Medical History  Diagnosis Date  . Hypertension   . Hyperlipidemia   . Anxiety   . History of colon polyps   . Vitamin D deficiency   . H/O hematuria   . Hx-TIA (transient ischemic attack)      Home Meds: See attached medication section for current medication list. Any medications entered into computer today will not appear on this note's list. The medications listed below were entered prior to today. Current Outpatient Prescriptions on File Prior to Visit  Medication Sig Dispense Refill  . amLODipine (NORVASC) 5 MG tablet TAKE ONE TABLET BY MOUTH DAILY  30 tablet  4  . aspirin EC 81 MG tablet Take 81 mg by mouth daily.      . Cholecalciferol (VITAMIN D3) 2000 UNITS TABS Take 2,000 Int'l Units by mouth daily.      . Coenzyme Q10 10 MG capsule Take 10 mg by mouth daily.      . CRESTOR 20 MG tablet TAKE 1 TABLET BY MOUTH EVERY DAY  30 tablet  4  . cyanocobalamin 1000 MCG tablet Take 100 mcg by mouth daily.      . hydrochlorothiazide (HYDRODIURIL) 25 MG tablet TAKE 1 TABLET BY MOUTH EVERY DAY  30 tablet  4  . lisinopril (PRINIVIL,ZESTRIL) 20 MG tablet TAKE 1 TABLET BY MOUTH EVERY DAY  30 tablet  4  . fluticasone (FLONASE) 50 MCG/ACT nasal spray Place 2 sprays into the nose daily.  16 g  2  . mometasone (NASONEX) 50 MCG/ACT nasal spray Place 2 sprays into the nose daily.  17 g  12   No current facility-administered medications on file prior to visit.    Allergies: No Known Allergies  History   Social History  . Marital Status: Married    Spouse Name: N/A    Number of Children: N/A  . Years of Education: N/A    Occupational History  . Not on file.   Social History Main Topics  . Smoking status: Former Games developer  . Smokeless tobacco: Never Used  . Alcohol Use: Yes     Comment: socially  . Drug Use: No  . Sexual Activity: Not on file   Other Topics Concern  . Not on file   Social History Narrative   Widowed. No children.   Retired.    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   This is where she worked prior to "retirement"     Family History  Problem Relation Age of Onset  . Heart disease Father   . Cancer Brother     colon     Review of Systems:  See HPI for pertinent ROS. All other ROS negative.    Physical Exam: Blood pressure 140/80, pulse 72, temperature 98.3 F (36.8 C), temperature source Oral, resp. rate 18, height 5\' 3"  (1.6 m), weight 173 lb (78.472 kg)., Body mass index is 30.65 kg/(m^2). General: Well-nourished well-developed white female .Appears in no acute distress. Neck: Supple. No thyromegaly. No lymphadenopathy. No carotid bruits. Lungs: Clear bilaterally to auscultation without wheezes, rales, or rhonchi. Breathing is unlabored. Heart: RRR with S1 S2. No murmurs, rubs, or gallops. Abdomen: Soft, non-tender, non-distended with normoactive bowel sounds. No hepatomegaly. No rebound/guarding. No obvious abdominal masses. Musculoskeletal:  Strength and tone normal for age. Extremities/Skin: Warm and dry. No clubbing or cyanosis. No edema. No rashes or suspicious lesions. Neuro: Alert and oriented X 3. Moves all extremities spontaneously. Gait is normal. CNII-XII grossly in tact. Psych:  Responds to questions appropriately with a normal affect.she is very pleasant. She became somewhat moderate tearful discussing her depression. She is apologetic.      ASSESSMENT AND PLAN:  67 y.o. year old female with  1. Depression--New Diagnosis Today-Detected on Depression Screen Discussed this with the patient at length. Discussed that sometimes this is caused  from a chemical imbalance in her brain. This is the case this is not something that she could control herself. Discussed that she does not need to fill ashamed of this.  Also discussed how these medications work. Discussed that she will not feel the effect of this for at least several weeks. However if she thinks she is experiencing adverse effects then called immediately. Otherwise just keep taking the pill every day until her followup office visit with me in 6 weeks. Also discussed with her that sometimes these medications can be frustrating to use. There is no way to know which of these medications work for her and watch what will cause adverse effects to her. Unfortunately it is just  a trial and error. - sertraline (ZOLOFT) 50 MG tablet; Take 1 tablet (50 mg total) by mouth daily.  Dispense: 30 tablet; Refill: 1  2. Hyperglycemia - COMPLETE METABOLIC PANEL WITH GFR - Hemoglobin A1c  3. Hypertension Control. Continue current medication. - COMPLETE METABOLIC PANEL WITH GFR  4. Hyperlipidemia - COMPLETE METABOLIC PANEL WITH GFR - Lipid panel  5. H/O TIA (transient ischemic attack)   6. Vitamin D deficiency Level was within normal limits on it on her current dose at her lab work in March 2014. Continue current dose of 200 IUs daily.  7. DEXA/BMD: Last was 2005. We discussed this at the last couple of office visits but she never followed up with this. She says that this is secondary to her depression and lack of motivation and interest. She was to get her depression controlled then we will discuss scheduling these.  8. Mammogram: Last was over 10 years ago. We have discussed her from having followup multiple times. See above regarding the DEXA scan. With this also she wants to get her depression controlled first then scheduled these tests.  9. History of colon polyps Colonoscopy 03/24/12 revealed polyp. Repeat 5 years.   10.  Zostavax: At the last office visit I told her to check her  insurance regarding cost. She did not followup with this. We will get her depression treated and then discuss these preventive care measures.  Followup office visit in 6 weeks. Follow up sooner she is having adverse effects to Zoloft.   Signed, [address] [place], [place], [facility] 05/27/2013 10:01 AM

## 2013-05-28 ENCOUNTER — Telehealth: Payer: Self-pay | Admitting: Family Medicine

## 2013-05-28 ENCOUNTER — Ambulatory Visit: Payer: 59 | Admitting: Physician Assistant

## 2013-05-28 MED ORDER — HYDROCHLOROTHIAZIDE 12.5 MG PO CAPS: 12.5000 mg | ORAL_CAPSULE | Freq: Every day | ORAL | Status: DC

## 2013-05-28 NOTE — Telephone Encounter (Signed)
Pt called with lab results and made aware of change to HCTZ.  Just had 25mg  dose refilled. Will cut in half.  No refill needed now.

## 2013-05-28 NOTE — Telephone Encounter (Signed)
Message copied by Donne Anon on Fri May 28, 2013  5:11 PM ------      Message from: Allayne Butcher      Created: Fri May 28, 2013  9:20 AM       A1C 6.0--cont diet changes( which we will re-address furhter once her depression is treated)      Cholesterol is at goal-cont current cholesterol med.       Some kidney numberes are up just a little bit--cut HCTZ in half--take 1/2 of the 25 mg pill. Send in new dose of 12.5mg  QD      Cont all other meds the same. ------

## 2013-07-08 ENCOUNTER — Telehealth: Payer: Self-pay | Admitting: *Deleted

## 2013-07-08 ENCOUNTER — Other Ambulatory Visit: Payer: Self-pay | Admitting: Physician Assistant

## 2013-07-08 ENCOUNTER — Ambulatory Visit (INDEPENDENT_AMBULATORY_CARE_PROVIDER_SITE_OTHER): Payer: 59 | Admitting: Physician Assistant

## 2013-07-08 ENCOUNTER — Encounter: Payer: Self-pay | Admitting: Physician Assistant

## 2013-07-08 VITALS — BP 134/80 | HR 68 | Temp 98.1°F | Resp 18 | Wt 174.0 lb

## 2013-07-08 DIAGNOSIS — R739 Hyperglycemia, unspecified: Secondary | ICD-10-CM

## 2013-07-08 DIAGNOSIS — F329 Major depressive disorder, single episode, unspecified: Secondary | ICD-10-CM

## 2013-07-08 DIAGNOSIS — R7309 Other abnormal glucose: Secondary | ICD-10-CM

## 2013-07-08 DIAGNOSIS — E559 Vitamin D deficiency, unspecified: Secondary | ICD-10-CM

## 2013-07-08 DIAGNOSIS — Z1239 Encounter for other screening for malignant neoplasm of breast: Secondary | ICD-10-CM

## 2013-07-08 DIAGNOSIS — Z86010 Personal history of colonic polyps: Secondary | ICD-10-CM

## 2013-07-08 DIAGNOSIS — G459 Transient cerebral ischemic attack, unspecified: Secondary | ICD-10-CM

## 2013-07-08 DIAGNOSIS — Z780 Asymptomatic menopausal state: Secondary | ICD-10-CM

## 2013-07-08 DIAGNOSIS — E785 Hyperlipidemia, unspecified: Secondary | ICD-10-CM

## 2013-07-08 DIAGNOSIS — E2839 Other primary ovarian failure: Secondary | ICD-10-CM

## 2013-07-08 DIAGNOSIS — I10 Essential (primary) hypertension: Secondary | ICD-10-CM

## 2013-07-08 MED ORDER — SERTRALINE HCL 50 MG PO TABS: 50.0000 mg | ORAL_TABLET | Freq: Every day | ORAL | Status: DC

## 2013-07-08 NOTE — Telephone Encounter (Signed)
[facility] on [address].  Rx written for provider to sign and faxed to pharmacy

## 2013-07-08 NOTE — Progress Notes (Signed)
Patient ID: KINLEY DOZIER MRN: [id number], DOB: 1945/09/11, 67 y.o. Date of Encounter: @DATE @  Chief Complaint:  Chief Complaint  Patient presents with  . 6 week follow up    after starting new meds    HPI: 67 y.o. year old white female  presents for followup office visit.  She was seen here on 05/27/13 for a routine office visit. At, we performed a depression screen. This revealed that she was having problems with depression but she just had not dealt with at this she was directly asked these questions she became tearful. She stated that for several years she has been battling with this. She has no children. Her husband passed away 8 years ago. She retired 2 years ago. However she then returned back to her job part time. 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 She stated that she just found no motivation no interest in things that she used to be involved with. He regarding medical issues she had the same problem. For example at prior office visit I discussed carbohydrate handouts with her. However she ended up going home and making no changes. We also discussed scheduling mammogram and bone density test because they were overdue. However she's reported that she had not gone and scheduled these. She said that this was just like everything else and she just did not have the motivation or interest to get this done. Says that this is not the way she used to be. Reportedly she used to be very active and involved in things. Just over the last 2 years she has not been interested or motivated. Says that she was raised to just deal with these things. Says that she probably been having some of these issues with depression ever since her husband passed away but has never dealt with them. Then when she tried to retire she really had difficulty.  At that visit I started Zoloft 50 mg. Today she says that she is feeling wonderful. Says that she feels just like she's back to her old self. Says that she has been  cleaning and scrubbing out of the house and has lots of energy . Says her coworkers have even commented that she seems much happier. She says that she is so thankful for this medication and that she has gotten is treated.!!   Past Medical History  Diagnosis Date  . Hypertension   . Hyperlipidemia   . Anxiety   . History of colon polyps   . Vitamin D deficiency   . H/O hematuria   . Hx-TIA (transient ischemic attack)      Home Meds: See attached medication section for current medication list. Any medications entered into computer today will not appear on this note's list. The medications listed below were entered prior to today. Current Outpatient Prescriptions on File Prior to Visit  Medication Sig Dispense Refill  . amLODipine (NORVASC) 5 MG tablet TAKE ONE TABLET BY MOUTH DAILY  30 tablet  4  . aspirin EC 81 MG tablet Take 81 mg by mouth daily.      . Cholecalciferol (VITAMIN D3) 2000 UNITS TABS Take 2,000 Int'l Units by mouth daily.      . Coenzyme Q10 10 MG capsule Take 10 mg by mouth daily.      . CRESTOR 20 MG tablet TAKE 1 TABLET BY MOUTH EVERY DAY  30 tablet  4  . cyanocobalamin 1000 MCG tablet Take 100 mcg by mouth daily.      . hydrochlorothiazide (MICROZIDE)  12.5 MG capsule Take 1 capsule (12.5 mg total) by mouth daily.      Marland Kitchen lisinopril (PRINIVIL,ZESTRIL) 20 MG tablet TAKE 1 TABLET BY MOUTH EVERY DAY  30 tablet  4   No current facility-administered medications on file prior to visit.    Allergies: No Known Allergies  History   Social History  . Marital Status: Married    Spouse Name: N/A    Number of Children: N/A  . Years of Education: N/A   Occupational History  . Not on file.   Social History Main Topics  . Smoking status: Former Games developer  . Smokeless tobacco: Never Used  . Alcohol Use: Yes     Comment: socially  . Drug Use: No  . Sexual Activity: Not on file   Other Topics Concern  . Not on file   Social History Narrative   Widowed. No children.    Retired.    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   This is where she worked prior to "retirement"     Family History  Problem Relation Age of Onset  . Heart disease Father   . Cancer Brother     colon     Review of Systems:  See HPI for pertinent ROS. All other ROS negative.    Physical Exam: Blood pressure 134/80, pulse 68, temperature 98.1 F (36.7 C), temperature source Oral, resp. rate 18, weight 174 lb (78.926 kg)., Body mass index is 30.83 kg/(m^2). General: WNWD WF. Appears in no acute distress. Neck: Supple. No thyromegaly. No lymphadenopathy.  Lungs: Clear bilaterally to auscultation without wheezes, rales, or rhonchi. Breathing is unlabored. Heart: RRR with S1 S2. No murmurs, rubs, or gallops. Musculoskeletal:  Strength and tone normal for age. Extremities/Skin: Warm and dry. No clubbing or cyanosis. No edema. No rashes or suspicious lesions. Neuro: Alert and oriented X 3. Moves all extremities spontaneously. Gait is normal. CNII-XII grossly in tact. Psych:  Responds to questions appropriately with a normal affect.     ASSESSMENT AND PLAN:  67 y.o. year old female with  1. Depression I discussed with her that she is on a relatively low dose of Zoloft. However she says that she feels great at this dose and does not need a higher dose. Says that she feels completely back to her normal self. - sertraline (ZOLOFT) 50 MG tablet; Take 1 tablet (50 mg total) by mouth daily.  Dispense: 30 tablet; Refill: 5  2. Hypertension: Controlled.       At lab 05/27/13 BUN/creatinine were slightly up. Noted to cut the HCTZ in half. She was to take half of the 25 mg pill. She says that she did decrease his dose and is currently taking a 12.5 mg dose.  3. Hyperlipidemia: Controlled. See last office visit note for details of this.  4. Hyperglycemia: See last office note regarding details of this. On 05/27/13 A1c was 6.0.  5. TIA (transient ischemic attack)  6. Vitamin  D deficiency  7. History of colon polyps: Colonoscopy 03/24/12 revealed polyp. Repeat 5 years.  8. Breast cancer screening: See office note 05/27/13. Now that she is not feeling depressed and is feeling motivated to take care of her health we will go ahead and schedule mammogram. - MM Digital Screening; Future  9. Postmenopausal estrogen deficiency Now that she is not feeling depressed and is feeling motivated to take care of her health we will schedule bone density test. - DG Bone Density; Future  10. Immunizations: Now  that she is not feeling depressed and is feeling motivated to take care of things have reminded her to call her insurance to round out coverage of Zostavax that she can decide whether to follow up with getting this.  [address] [place], [place], [facility] 07/08/2013 5:16 PM

## 2013-08-11 ENCOUNTER — Ambulatory Visit
Admission: RE | Admit: 2013-08-11 | Discharge: 2013-08-11 | Disposition: A | Discharge status: Home / Self Care | Payer: Medicare PPO | Source: Ambulatory Visit | Attending: Physician Assistant | Admitting: Physician Assistant

## 2013-08-11 DIAGNOSIS — Z1239 Encounter for other screening for malignant neoplasm of breast: Secondary | ICD-10-CM

## 2013-08-11 DIAGNOSIS — E2839 Other primary ovarian failure: Secondary | ICD-10-CM

## 2013-08-11 LAB — HM DEXA SCAN

## 2013-08-12 NOTE — Progress Notes (Signed)
It looks like this patient was scheduled for mammogram and bone density test yesterday but did not go for the test. Call patient to see if she wants [place] to reschedule these.

## 2013-08-12 NOTE — Progress Notes (Signed)
It looks like patient was scheduled for mammogram and bone density test for yesterday but did not go. Follow up with the patient and see if she needs these to be rescheduled or not.

## 2013-08-19 ENCOUNTER — Other Ambulatory Visit: Payer: Self-pay | Admitting: Physician Assistant

## 2013-08-19 NOTE — Telephone Encounter (Signed)
Medication refilled per protocol. 

## 2013-08-20 ENCOUNTER — Other Ambulatory Visit: Payer: Self-pay | Admitting: Physician Assistant

## 2013-08-20 NOTE — Telephone Encounter (Signed)
Medication refilled per protocol. 

## 2013-08-23 ENCOUNTER — Telehealth: Payer: Self-pay | Admitting: Family Medicine

## 2013-08-23 ENCOUNTER — Encounter: Payer: Self-pay | Admitting: Family Medicine

## 2013-08-23 NOTE — Telephone Encounter (Signed)
Message copied by Donne Anon on Mon Aug 23, 2013  3:55 PM ------      Message from: Allayne Butcher      Created: Mon Aug 16, 2013 10:06 AM       She has significant osteopenia (low bone mass) in hip, but not quite to point of osteoporosis.      Only risk factor is mother with h/o hip fracture.       Pt has no h/o fracture, chronic steroid use, she does not smoke.       Will not start Rx med now but will need to moniotr closely-needs f/u DEXA 2 years. She is on Vit D. Needs to also take Calcium--1200mg  daily.      Weight bearing exercise.      Please go in and add Osteopenia to her HISTORY AND Problem List . ------

## 2013-08-23 NOTE — Telephone Encounter (Signed)
Pt aware of Dexa scan report.  Osteopenia in hip.  Continue Vitamin D  And add Calcium at least 1200 mg daily in divided doses.

## 2013-09-24 ENCOUNTER — Encounter: Payer: Self-pay | Admitting: Family Medicine

## 2013-11-16 ENCOUNTER — Encounter: Payer: Self-pay | Admitting: Family Medicine

## 2013-11-16 ENCOUNTER — Ambulatory Visit (INDEPENDENT_AMBULATORY_CARE_PROVIDER_SITE_OTHER): Payer: 59 | Admitting: Family Medicine

## 2013-11-16 VITALS — BP 156/74 | HR 80 | Temp 97.3°F | Resp 18 | Ht 64.0 in | Wt 174.0 lb

## 2013-11-16 DIAGNOSIS — J111 Influenza due to unidentified influenza virus with other respiratory manifestations: Secondary | ICD-10-CM

## 2013-11-16 MED ORDER — PROMETHAZINE HCL 12.5 MG PO TABS: 12.5000 mg | ORAL_TABLET | Freq: Four times a day (QID) | ORAL | Status: DC | PRN

## 2013-11-16 MED ORDER — OSELTAMIVIR PHOSPHATE 75 MG PO CAPS: 75.0000 mg | ORAL_CAPSULE | Freq: Two times a day (BID) | ORAL | Status: DC

## 2013-11-16 NOTE — Progress Notes (Signed)
Subjective:    Patient ID: Sherry Kent, female    DOB: Jul 22, 1946, 68 y.o.   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  Beginning approximately 2 weeks ago she had a viral illness. She began to recover however 3 days ago she developed diffuse myalgias, severe fatigue, subjective fevers, mild cough.  She denies any chest pain. She denies any pleurisy or shortness of breath. She denies any hemoptysis. Cough is nonproductive. She is having nausea and occasional vomiting. She denies any diarrhea. She is still passing stool and flatus. She denies any dysuria. She denies any rashes. She denies any sinus pain or sore throat. She denies any otalgia. Past Medical History  Diagnosis Date  . Hypertension   . Hyperlipidemia   . Anxiety   . History of colon polyps   . Vitamin D deficiency   . H/O hematuria   . Hx-TIA (transient ischemic attack)    Current Outpatient Prescriptions on File Prior to Visit  Medication Sig Dispense Refill  . amLODipine (NORVASC) 5 MG tablet TAKE 1 TABLET BY MOUTH DAILY  30 tablet  4  . aspirin EC 81 MG tablet Take 81 mg by mouth daily.      . Calcium Carbonate-Vit D-Min (CALCIUM 1200 PO) Take by mouth. In divided doses      . Cholecalciferol (VITAMIN D3) 2000 UNITS TABS Take 2,000 Int'l Units by mouth daily.      . Coenzyme Q10 10 MG capsule Take 10 mg by mouth daily.      . CRESTOR 20 MG tablet TAKE 1 TABLET BY MOUTH DAILY  30 tablet  4  . cyanocobalamin 1000 MCG tablet Take 100 mcg by mouth daily.      . hydrochlorothiazide (HYDRODIURIL) 25 MG tablet TAKE 1 TABLET BY MOUTH EVERY DAY  30 tablet  4  . lisinopril (PRINIVIL,ZESTRIL) 20 MG tablet TAKE 1 TABLET BY MOUTH EVERY DAY  30 tablet  4  . sertraline (ZOLOFT) 50 MG tablet Take 1 tablet (50 mg total) by mouth daily.  30 tablet  5   No current facility-administered medications on file prior to  visit.   No Known Allergies History   Social History  . Marital Status: Married    Spouse Name: N/A    Number of Children: N/A  . Years of Education: N/A   Occupational History  . Not on file.   Social History Main Topics  . Smoking status: Former Research scientist (life sciences)  . Smokeless tobacco: Never Used  . Alcohol Use: Yes     Comment: socially  . Drug Use: No  . Sexual Activity: Not on file   Other Topics Concern  . Not on file   Social History Narrative   Widowed. No children.   Retired.    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   This is where she worked prior to "retirement"       Review of Systems  All other systems reviewed and are negative.       Objective:   Physical Exam  Vitals reviewed. Constitutional: She appears well-developed and well-nourished. No distress.  HENT:  Right Ear: Tympanic membrane, external ear and ear canal normal.  Left Ear: Tympanic membrane, external ear and ear canal normal.  Nose: Nose normal. No mucosal edema or rhinorrhea. Right sinus exhibits no maxillary sinus tenderness and no frontal sinus tenderness.  Left sinus exhibits no maxillary sinus tenderness and no frontal sinus tenderness.  Mouth/Throat: Uvula is midline, oropharynx is clear and moist and mucous membranes are normal. No oropharyngeal exudate, posterior oropharyngeal edema, posterior oropharyngeal erythema or tonsillar abscesses.  Neck: Neck supple. No thyromegaly present.  Cardiovascular: Normal rate, regular rhythm and normal heart sounds.  Exam reveals no gallop and no friction rub.   No murmur heard. Pulmonary/Chest: Effort normal and breath sounds normal. No respiratory distress. She has no wheezes. She has no rales. She exhibits no tenderness.  Abdominal: Soft. Bowel sounds are normal. She exhibits no distension and no mass. There is no tenderness. There is no rebound and no guarding.  Musculoskeletal: She exhibits no edema.  Lymphadenopathy:    She has no  cervical adenopathy.  Skin: No rash noted. She is not diaphoretic. No erythema. No pallor.          Assessment & Plan:  1. Influenza with other respiratory manifestations The patient has a flulike illness. I believe she had an initial viral infection and then acquired a  secondary infection. I will treat empirically with Tamiflu 75 mg by mouth twice a day for 5 days. Also check a CBC and a CMP. If the patient's symptoms change or if she worsens she is to return immediately for further evaluation. If the symptoms are not completely better in 7-10 days she is also to return. - oseltamivir (TAMIFLU) 75 MG capsule; Take 1 capsule (75 mg total) by mouth 2 (two) times daily.  Dispense: 10 capsule; Refill: 0 - CBC with Differential - COMPLETE METABOLIC PANEL WITH GFR

## 2013-11-17 LAB — COMPLETE METABOLIC PANEL WITH GFR
ALK PHOS: 86 U/L (ref 39–117)
ALT: 12 U/L (ref 0–35)
AST: 15 U/L (ref 0–37)
Albumin: 4.2 g/dL (ref 3.5–5.2)
BUN: 15 mg/dL (ref 6–23)
CALCIUM: 9.2 mg/dL (ref 8.4–10.5)
CO2: 29 mEq/L (ref 19–32)
Chloride: 103 mEq/L (ref 96–112)
Creat: 1 mg/dL (ref 0.50–1.10)
GFR, EST AFRICAN AMERICAN: 67 mL/min
GFR, Est Non African American: 58 mL/min — ABNORMAL LOW
Glucose, Bld: 105 mg/dL — ABNORMAL HIGH (ref 70–99)
Potassium: 4.2 mEq/L (ref 3.5–5.3)
Sodium: 137 mEq/L (ref 135–145)
Total Bilirubin: 0.3 mg/dL (ref 0.2–1.2)
Total Protein: 6.9 g/dL (ref 6.0–8.3)

## 2013-11-17 LAB — CBC WITH DIFFERENTIAL/PLATELET
BASOS PCT: 0 % (ref 0–1)
Basophils Absolute: 0 10*3/uL (ref 0.0–0.1)
Eosinophils Absolute: 0.4 10*3/uL (ref 0.0–0.7)
Eosinophils Relative: 5 % (ref 0–5)
HEMATOCRIT: 38 % (ref 36.0–46.0)
Hemoglobin: 12.6 g/dL (ref 12.0–15.0)
Lymphocytes Relative: 38 % (ref 12–46)
Lymphs Abs: 3 10*3/uL (ref 0.7–4.0)
MCH: 27.5 pg (ref 26.0–34.0)
MCHC: 33.2 g/dL (ref 30.0–36.0)
MCV: 83 fL (ref 78.0–100.0)
Monocytes Absolute: 0.7 10*3/uL (ref 0.1–1.0)
Monocytes Relative: 9 % (ref 3–12)
NEUTROS ABS: 3.8 10*3/uL (ref 1.7–7.7)
Neutrophils Relative %: 48 % (ref 43–77)
Platelets: 328 10*3/uL (ref 150–400)
RBC: 4.58 MIL/uL (ref 3.87–5.11)
RDW: 16.7 % — ABNORMAL HIGH (ref 11.5–15.5)
WBC: 8 10*3/uL (ref 4.0–10.5)

## 2013-12-27 ENCOUNTER — Ambulatory Visit (INDEPENDENT_AMBULATORY_CARE_PROVIDER_SITE_OTHER): Payer: 59 | Admitting: Physician Assistant

## 2013-12-27 ENCOUNTER — Encounter: Payer: Self-pay | Admitting: Physician Assistant

## 2013-12-27 VITALS — BP 164/86 | HR 80 | Temp 98.7°F | Resp 18 | Wt 167.0 lb

## 2013-12-27 DIAGNOSIS — K2990 Gastroduodenitis, unspecified, without bleeding: Secondary | ICD-10-CM

## 2013-12-27 DIAGNOSIS — K2970 Gastritis, unspecified, without bleeding: Secondary | ICD-10-CM

## 2013-12-27 MED ORDER — PROMETHAZINE HCL 25 MG PO TABS: 25.0000 mg | ORAL_TABLET | Freq: Three times a day (TID) | ORAL | Status: DC | PRN

## 2013-12-27 MED ORDER — PROMETHAZINE HCL 25 MG/ML IJ SOLN
25.0000 mg | Freq: Once | INTRAMUSCULAR | Status: AC
  Administered 2013-12-27: 25 mg via INTRAMUSCULAR

## 2013-12-27 NOTE — Progress Notes (Signed)
Patient ID: Sherry Kent MRN: [id number], DOB: 02/10/1946, 68 y.o. Date of Encounter: 12/27/2013, 4:01 PM    Chief Complaint:  Chief Complaint  Patient presents with  . vomiting since yesterday    can't keep anything down     HPI: 68 y.o. year old white female [place] that this started yesterday morning. She is continued to have episodes of vomiting since then. Has had no significant abdominal pain. Has had no diarrhea. Has had no fevers and no chills.     Home Meds: See attached medication section for any medications that were entered at today's visit. The computer does not put those onto this list.The following list is a list of meds entered prior to today's visit.   Current Outpatient Prescriptions on File Prior to Visit  Medication Sig Dispense Refill  . amLODipine (NORVASC) 5 MG tablet TAKE 1 TABLET BY MOUTH DAILY  30 tablet  4  . aspirin EC 81 MG tablet Take 81 mg by mouth daily.      . Calcium Carbonate-Vit D-Min (CALCIUM 1200 PO) Take by mouth. In divided doses      . Cholecalciferol (VITAMIN D3) 2000 UNITS TABS Take 2,000 Int'l Units by mouth daily.      . Coenzyme Q10 10 MG capsule Take 10 mg by mouth daily.      . CRESTOR 20 MG tablet TAKE 1 TABLET BY MOUTH DAILY  30 tablet  4  . cyanocobalamin 1000 MCG tablet Take 100 mcg by mouth daily.      . hydrochlorothiazide (HYDRODIURIL) 25 MG tablet TAKE 1 TABLET BY MOUTH EVERY DAY  30 tablet  4  . lisinopril (PRINIVIL,ZESTRIL) 20 MG tablet TAKE 1 TABLET BY MOUTH EVERY DAY  30 tablet  4  . oseltamivir (TAMIFLU) 75 MG capsule Take 1 capsule (75 mg total) by mouth 2 (two) times daily.  10 capsule  0  . sertraline (ZOLOFT) 50 MG tablet Take 1 tablet (50 mg total) by mouth daily.  30 tablet  5  . promethazine (PHENERGAN) 12.5 MG tablet Take 1 tablet (12.5 mg total) by mouth every 6 (six) hours as needed for nausea or vomiting.  30 tablet  0   No current facility-administered medications on file prior to visit.    Allergies: No  Known Allergies    Review of Systems: See HPI for pertinent ROS. All other ROS negative.    Physical Exam: Blood pressure 164/86, pulse 80, temperature 98.7 F (37.1 C), temperature source Oral, resp. rate 18, weight 167 lb (75.751 kg)., Body mass index is 28.65 kg/(m^2). General:  WNWD WF. Lying on exam table, holding emesis basin.  Lungs: Clear bilaterally to auscultation without wheezes, rales, or rhonchi. Breathing is unlabored. Heart: Regular rhythm. No murmurs, rubs, or gallops. Abdomen: Soft, non-tender, non-distended with normoactive bowel sounds. No hepatomegaly. No rebound/guarding. No obvious abdominal masses.No area of tenderness with palpation. Msk:  Strength and tone normal for age. Extremities/Skin: Warm and dry. Neuro: Alert and oriented X 3. Moves all extremities spontaneously. Gait is normal. CNII-XII grossly in tact. Psych:  Responds to questions appropriately with a normal affect.     ASSESSMENT AND PLAN:  68 y.o. year old female with  1. Viral gastritis Will give Phenergan 25 mg IM now to give her some relief. Also will send her in a prescription. Discussed with her whether to use oral versus suppository medicine. She will try the oral and hopefully this will be able to stay down. Once the vomiting  comes to halt, then she needs to try to drink small frequent sips of fluid to prevent dehydration. Stay with clear liquid diet. Advance to bland diet as tolerated. If she develops abdominal pain, or if vomiting does not stop in 24 hours, then  follow up. - promethazine (PHENERGAN) 25 MG tablet; Take 1 tablet (25 mg total) by mouth every 8 (eight) hours as needed for nausea or vomiting.  Dispense: 20 tablet; Refill: 0   Signed, [address] [place], [place], [facility] 12/27/2013 4:01 PM

## 2014-02-22 ENCOUNTER — Other Ambulatory Visit: Payer: Self-pay | Admitting: Physician Assistant

## 2014-02-22 NOTE — Telephone Encounter (Signed)
Refill appropriate and filled per protocol. 

## 2014-03-11 ENCOUNTER — Other Ambulatory Visit: Payer: Self-pay | Admitting: Physician Assistant

## 2014-03-12 NOTE — Telephone Encounter (Signed)
Refill appropriate and filled per protocol. 

## 2014-04-25 ENCOUNTER — Other Ambulatory Visit: Payer: Self-pay | Admitting: Physician Assistant

## 2014-05-13 ENCOUNTER — Other Ambulatory Visit: Payer: Self-pay | Admitting: Physician Assistant

## 2014-08-22 ENCOUNTER — Other Ambulatory Visit: Payer: Self-pay | Admitting: Family Medicine

## 2014-08-29 ENCOUNTER — Other Ambulatory Visit: Payer: Self-pay | Admitting: Family Medicine

## 2014-08-29 NOTE — Telephone Encounter (Signed)
Refill appropriate and filled per protocol. 

## 2014-10-03 ENCOUNTER — Other Ambulatory Visit: Payer: Self-pay | Admitting: Family Medicine

## 2014-10-04 ENCOUNTER — Encounter: Payer: Self-pay | Admitting: Family Medicine

## 2014-10-04 NOTE — Telephone Encounter (Signed)
Medication refill for one time only. (90 day)  Patient needs to be seen.  Letter sent for patient to call and schedule 

## 2014-10-24 ENCOUNTER — Encounter: Payer: Self-pay | Admitting: Physician Assistant

## 2014-10-24 ENCOUNTER — Ambulatory Visit (INDEPENDENT_AMBULATORY_CARE_PROVIDER_SITE_OTHER): Payer: 59 | Admitting: Physician Assistant

## 2014-10-24 ENCOUNTER — Telehealth: Payer: Self-pay | Admitting: *Deleted

## 2014-10-24 VITALS — BP 144/80 | HR 68 | Temp 98.1°F | Resp 18 | Wt 177.0 lb

## 2014-10-24 DIAGNOSIS — E785 Hyperlipidemia, unspecified: Secondary | ICD-10-CM

## 2014-10-24 DIAGNOSIS — I10 Essential (primary) hypertension: Secondary | ICD-10-CM

## 2014-10-24 DIAGNOSIS — R55 Syncope and collapse: Secondary | ICD-10-CM

## 2014-10-24 DIAGNOSIS — R739 Hyperglycemia, unspecified: Secondary | ICD-10-CM

## 2014-10-24 LAB — CBC WITH DIFFERENTIAL/PLATELET
Basophils Absolute: 0 10*3/uL (ref 0.0–0.1)
Basophils Relative: 0 % (ref 0–1)
EOS ABS: 0.2 10*3/uL (ref 0.0–0.7)
EOS PCT: 2 % (ref 0–5)
HCT: 36.2 % (ref 36.0–46.0)
Hemoglobin: 12.2 g/dL (ref 12.0–15.0)
LYMPHS PCT: 24 % (ref 12–46)
Lymphs Abs: 2.7 10*3/uL (ref 0.7–4.0)
MCH: 28.4 pg (ref 26.0–34.0)
MCHC: 33.7 g/dL (ref 30.0–36.0)
MCV: 84.2 fL (ref 78.0–100.0)
MPV: 9.3 fL (ref 8.6–12.4)
Monocytes Absolute: 0.8 10*3/uL (ref 0.1–1.0)
Monocytes Relative: 7 % (ref 3–12)
NEUTROS ABS: 7.6 10*3/uL (ref 1.7–7.7)
NEUTROS PCT: 67 % (ref 43–77)
PLATELETS: 324 10*3/uL (ref 150–400)
RBC: 4.3 MIL/uL (ref 3.87–5.11)
RDW: 15.5 % (ref 11.5–15.5)
WBC: 11.4 10*3/uL — ABNORMAL HIGH (ref 4.0–10.5)

## 2014-10-24 LAB — COMPLETE METABOLIC PANEL WITH GFR
ALBUMIN: 4.2 g/dL (ref 3.5–5.2)
ALT: 12 U/L (ref 0–35)
AST: 11 U/L (ref 0–37)
Alkaline Phosphatase: 100 U/L (ref 39–117)
BILIRUBIN TOTAL: 0.3 mg/dL (ref 0.2–1.2)
BUN: 36 mg/dL — ABNORMAL HIGH (ref 6–23)
CO2: 26 meq/L (ref 19–32)
Calcium: 9.9 mg/dL (ref 8.4–10.5)
Chloride: 104 mEq/L (ref 96–112)
Creat: 1.57 mg/dL — ABNORMAL HIGH (ref 0.50–1.10)
GFR, EST AFRICAN AMERICAN: 39 mL/min — AB
GFR, Est Non African American: 33 mL/min — ABNORMAL LOW
Glucose, Bld: 91 mg/dL (ref 70–99)
Potassium: 4.4 mEq/L (ref 3.5–5.3)
SODIUM: 142 meq/L (ref 135–145)
Total Protein: 7 g/dL (ref 6.0–8.3)

## 2014-10-24 LAB — TSH: TSH: 0.92 u[IU]/mL (ref 0.350–4.500)

## 2014-10-24 LAB — HEMOGLOBIN A1C
Hgb A1c MFr Bld: 6 % — ABNORMAL HIGH (ref <5.7)
Mean Plasma Glucose: 126 mg/dL — ABNORMAL HIGH (ref <117)

## 2014-10-24 NOTE — Progress Notes (Signed)
Patient ID: Sherry Kent MRN: [id number], DOB: [birth year], 69 y.o. Date of Encounter: @DATE @  Chief Complaint:  Chief Complaint  Patient presents with  . fainted in kitchen 3 days ago    felt herself "getting hot"  next thing waking up on floor.    HPI: 69 y.o. year old white female  presents with above symptoms.  She states that this episodes occurred on Friday 10/21/14 at about 8:00 PM. She says that during that day she had been completely normal. Says that about 8:00 PM she was just sitting watching TV.  She suddenly developed what felt like a warm flushed feeling-- this started at her feet and moved up her body. She says that she just felt weird and the first thing that came to her mind was to worry about a heart attack so she got up to go to get an aspirin from the kitchen. Says the next thing she knew she was on the kitchen floor. She lives alone. Therefore episode was not witnessed and she does not know how long she was passed out. Says that when she came to-- she vomited once. Says then she was feeling okay other than just feeling a little groggy. Says that she stayed awake long enough to make sure she seemed like she was okay -- then she went to bed.  She has felt normal since then other than just her head " feeling a little bit cloudy headed."  States that prior to the episode, and after the episode--- at both times--- She had no pressure heaviness tightness or squeezing in her chest. No shortness of breath No diaphoresis No discomfort in her neck or either arm. She felt no palpitations and did not feel her heart racing or skipping.  Also I was wondering if the fact that she vomited--that maybe she was having some GI upset that caused vasovagal syncope. However she says that she had eaten dinner around 5:30 PM. She says that when she was sitting watching TV she was not feeling any abdominal discomfort. Was not feeling nauseous. Was having no belching or acid reflux  symptoms. Was having no flatulence.  However after the episode she just vomited that one time but then had no further vomiting during the night or since and never had any episodes of diarrhea. Never had any abdominal pain.  I asked her what was the most exertion she had done recently prior to the episode. She states that on Thursday 10/20/14 she walked for 10-15 minutes with her friends outside. She worked for 2-3 hours at [employer] work She went to [employer] and went to [employer]. She says that even with walking across these parking lots and walking up and down the aisles of the stores,  she felt perfectly fine. Same was true when she went for her 10-15 minute walk with her friends. She had no discomfort in her chest and no increased amount of dyspnea. Felt no palpitations. Felt no presyncope.   Past Medical History  Diagnosis Date  . Hypertension   . Hyperlipidemia   . Anxiety   . History of colon polyps   . Vitamin D deficiency   . H/O hematuria   . Hx-TIA (transient ischemic attack)      Home Meds: Outpatient Prescriptions Prior to Visit  Medication Sig Dispense Refill  . amLODipine (NORVASC) 5 MG tablet TAKE 1 TABLET BY MOUTH EVERY DAY 30 tablet 3  . aspirin EC 81 MG tablet Take  81 mg by mouth daily.    . Calcium Carbonate-Vit D-Min (CALCIUM 1200 PO) Take by mouth. In divided doses    . Cholecalciferol (VITAMIN D3) 2000 UNITS TABS Take 2,000 Int'l Units by mouth daily.    . Coenzyme Q10 10 MG capsule Take 10 mg by mouth daily.    . CRESTOR 20 MG tablet TAKE 1 TABLET BY MOUTH EVERY DAY 30 tablet 5  . cyanocobalamin 1000 MCG tablet Take 100 mcg by mouth daily.    . hydrochlorothiazide (HYDRODIURIL) 25 MG tablet TAKE 1 TABLET BY MOUTH EVERY DAY 90 tablet 0  . lisinopril (PRINIVIL,ZESTRIL) 20 MG tablet TAKE 1 TABLET BY MOUTH EVERY DAY 90 tablet 0  . sertraline (ZOLOFT) 50 MG tablet TAKE 1 TABLET BY MOUTH EVERY DAY 30 tablet 3  . oseltamivir (TAMIFLU) 75 MG  capsule Take 1 capsule (75 mg total) by mouth 2 (two) times daily. 10 capsule 0  . promethazine (PHENERGAN) 12.5 MG tablet Take 1 tablet (12.5 mg total) by mouth every 6 (six) hours as needed for nausea or vomiting. 30 tablet 0  . promethazine (PHENERGAN) 25 MG tablet Take 1 tablet (25 mg total) by mouth every 8 (eight) hours as needed for nausea or vomiting. 20 tablet 0   No facility-administered medications prior to visit.    Allergies: No Known Allergies  History   Social History  . Marital Status: Married    Spouse Name: N/A  . Number of Children: N/A  . Years of Education: N/A   Occupational History  . Not on file.   Social History Main Topics  . Smoking status: Former Research scientist (life sciences)  . Smokeless tobacco: Never Used  . Alcohol Use: Yes     Comment: socially  . Drug Use: No  . Sexual Activity: Not on file   Other Topics Concern  . Not on file   Social History Narrative   Widowed. No children.   Retired.    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   This is where she worked prior to "retirement"     Family History  Problem Relation Age of Onset  . Heart disease Father   . Cancer Brother     colon     Review of Systems:  See HPI for pertinent ROS. All other ROS negative.    Physical Exam: Blood pressure 144/80, pulse 68, temperature 98.1 F (36.7 C), temperature source Oral, resp. rate 18, weight 177 lb (80.287 kg)., Body mass index is 30.37 kg/(m^2). General: WNWD WF. Appears in no acute distress. Neck: Supple. No thyromegaly. No lymphadenopathy. No carotid bruits. Lungs: Clear bilaterally to auscultation without wheezes, rales, or rhonchi. Breathing is unlabored. Heart: RRR with S1 S2. No murmurs, rubs, or gallops. Abdomen: Soft, non-tender, non-distended with normoactive bowel sounds. No hepatomegaly. No rebound/guarding. No obvious abdominal masses. Musculoskeletal:  Strength and tone normal for age. Extremities/Skin: Warm and dry.  Neuro: Alert  and oriented X 3. Moves all extremities spontaneously. Gait is normal. CNII-XII grossly in tact. Strength 5/5 in all 4 extremities. Romberg normal  Psych:  Responds to questions appropriately with a normal affect.   12 Lead EKG----NSR--Nonspecific ST-T changes.   ASSESSMENT AND PLAN:  69 y.o. year old female with  1. Syncope, unspecified syncope type  - EKG 12-Lead - COMPLETE METABOLIC PANEL WITH GFR - CBC with Differential/Platelet - Hemoglobin A1c - TSH - Ambulatory referral to Cardiology  2. Hyperlipidemia - COMPLETE METABOLIC PANEL WITH GFR - Ambulatory referral to Cardiology  3.  Essential hypertension - COMPLETE METABOLIC PANEL WITH GFR - Ambulatory referral to Cardiology  4. Hyperglycemia - COMPLETE METABOLIC PANEL WITH GFR - Hemoglobin A1c - Ambulatory referral to Cardiology  EKG obtained. Shows normal sinus rhythm with nonspecific ST-T changes. Will obtain labs. Will obtain referral to cardiology. She states that she has never seen a cardiologist and has never had any cardiology evaluation. She is not fasting so cannot check lipid panel today. Her last lipid panel here was 05/2013 so this is overdue. Told her to schedule a regular follow-up visit with [place] in one month and come fasting so we can recheck FLP.   [address] [place], [place], [facility] 10/24/2014 10:37 AM

## 2014-10-24 NOTE — Telephone Encounter (Signed)
Pt has appointment with [facility] cardiology on Feb 24 at 3:15pm with Dr. Angelena Form, left message with pt to return my call for appt date time and location

## 2014-10-25 NOTE — Telephone Encounter (Signed)
Contacted pt and she is aware of appt to Cardiologist

## 2014-10-26 ENCOUNTER — Ambulatory Visit (INDEPENDENT_AMBULATORY_CARE_PROVIDER_SITE_OTHER): Payer: Medicare PPO | Admitting: Cardiovascular Disease

## 2014-10-26 ENCOUNTER — Encounter: Payer: Self-pay | Admitting: Cardiovascular Disease

## 2014-10-26 VITALS — BP 158/82 | HR 79 | Ht 64.0 in | Wt 174.8 lb

## 2014-10-26 DIAGNOSIS — R55 Syncope and collapse: Secondary | ICD-10-CM

## 2014-10-26 DIAGNOSIS — R011 Cardiac murmur, unspecified: Secondary | ICD-10-CM

## 2014-10-26 DIAGNOSIS — I10 Essential (primary) hypertension: Secondary | ICD-10-CM

## 2014-10-26 NOTE — Patient Instructions (Signed)
Your physician recommends that you schedule a follow-up appointment in:  6-8 weeks.   Your physician has requested that you have an echocardiogram. Echocardiography is a painless test that uses sound waves to create images of your heart. It provides your doctor with information about the size and shape of your heart and how well your heart's chambers and valves are working. This procedure takes approximately one hour. There are no restrictions for this procedure.   Your physician has requested that you have a carotid duplex. This test is an ultrasound of the carotid arteries in your neck. It looks at blood flow through these arteries that supply the brain with blood. Allow one hour for this exam. There are no restrictions or special instructions.

## 2014-10-26 NOTE — Progress Notes (Signed)
History of Present Illness: 69 yo female with history of HTN, TIA, anxiety referred today for evaluation of syncope. She notes episode of feeling warm from her feet up to her head on 10/21/14. She got up and stood to walk to the kitchen and felt dizzy the passed out. She remembers vomiting after she woke up and then felt normal. She had been feeling well that day. She is very active. She is now a widow and lives alones. She smoked 1ppd for 30 years but stopped in 2005. No chest pain or SOB. No prior cardiac issues.   Primary Care Provider: Dena Billet  Past Medical History  Diagnosis Date  . Hypertension   . Anxiety   . History of colon polyps   . H/O hematuria   . Hyperlipidemia     Past Surgical History  Procedure Laterality Date  . Appendectomy  8/07  . Dilation and curettage of uterus    . Tonsillectomy    . Eye surgery      bilat cataracts    Current Outpatient Prescriptions  Medication Sig Dispense Refill  . amLODipine (NORVASC) 5 MG tablet TAKE 1 TABLET BY MOUTH EVERY DAY 30 tablet 3  . aspirin EC 81 MG tablet Take 81 mg by mouth daily.    . Calcium Carbonate-Vit D-Min (CALCIUM 1200 PO) Take by mouth. In divided doses    . Cholecalciferol (VITAMIN D3) 2000 UNITS TABS Take 2,000 Int'l Units by mouth daily.    . Coenzyme Q10 10 MG capsule Take 10 mg by mouth daily.    . CRESTOR 20 MG tablet TAKE 1 TABLET BY MOUTH EVERY DAY 30 tablet 5  . cyanocobalamin 1000 MCG tablet Take 100 mcg by mouth daily.    . hydrochlorothiazide (HYDRODIURIL) 25 MG tablet TAKE 1 TABLET BY MOUTH EVERY DAY 90 tablet 0  . lisinopril (PRINIVIL,ZESTRIL) 20 MG tablet TAKE 1 TABLET BY MOUTH EVERY DAY 90 tablet 0  . sertraline (ZOLOFT) 50 MG tablet TAKE 1 TABLET BY MOUTH EVERY DAY 30 tablet 3   No current facility-administered medications for this visit.    No Known Allergies  History   Social History  . Marital Status: Married    Spouse Name: N/A  . Number of Children: 0  . Years of  Education: N/A   Occupational History  . 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 Smoking status: Former Smoker -- 1.00 packs/day for 30 years    Types: Cigarettes    Quit date: 10/27/2003  . Smokeless tobacco: Never Used  . Alcohol Use: 0.0 oz/week    0 Standard drinks or equivalent per week     Comment: socially  . Drug Use: No  . Sexual Activity: Not on file   Other Topics Concern  . Not on file   Social History Narrative   Widowed. No children.   Retired.    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   This is where she worked prior to "retirement"     Family History  Problem Relation Age of Onset  . Colon cancer Sister    Review of Systems:  As stated in the HPI and otherwise negative.   BP 158/82 mmHg  Pulse 79  Ht 5\' 4"  (1.626 m)  Wt 174 lb 12.8 oz (79.289 kg)  BMI 29.99 kg/m2  SpO2 97%  Physical Examination: General: Well developed, well nourished, NAD HEENT: OP clear, mucus membranes moist SKIN: warm, dry. No  rashes. Neuro: No focal deficits Musculoskeletal: Muscle strength 5/5 all ext Psychiatric: Mood and affect normal Neck: No JVD, no carotid bruits, no thyromegaly, no lymphadenopathy. Lungs:Clear bilaterally, no wheezes, rhonci, crackles Cardiovascular: Regular rate and rhythm. No murmurs, gallops or rubs. Abdomen:Soft. Bowel sounds present. Non-tender.  Extremities: No lower extremity edema. Pulses are 2 + in the bilateral DP/PT.  EKG: 10/24/14: sinus, rate 66 bpm  Assessment and Plan:   1. Syncope: She had an isolated syncopal event with no palpitations, chest pain or SOB. This seems to be vasovagal. She has no other symptoms concerning for cardiac disease. Will arrange echo to assess LV systolic function. Will arrange carotid artery dopplers to exclude carotid artery disease. I do not think she needs an ischemic evaluation at this time.   2. Cardiac murmur: Echo to assess this systolic murmur  3. HTN: BP has been  well controlled at home.

## 2014-10-27 ENCOUNTER — Telehealth: Payer: Self-pay | Admitting: Family Medicine

## 2014-10-27 NOTE — Telephone Encounter (Signed)
Pt aware of lab results and provider recommendations.  HCTZ removed from med list.

## 2014-10-27 NOTE — Telephone Encounter (Signed)
-----   Message from Orlena Sheldon, PA-C sent at 10/25/2014  2:51 PM EST ----- Labs indicate that she is a little dehydrated and that kidney function a little decreased. Stop the HCTZ.  Remainder of labs ok.

## 2014-11-03 ENCOUNTER — Ambulatory Visit (HOSPITAL_COMMUNITY): Payer: Medicare PPO | Attending: Physician Assistant | Admitting: Cardiology

## 2014-11-03 ENCOUNTER — Ambulatory Visit (HOSPITAL_BASED_OUTPATIENT_CLINIC_OR_DEPARTMENT_OTHER): Payer: Medicare PPO | Admitting: Radiology

## 2014-11-03 DIAGNOSIS — R55 Syncope and collapse: Secondary | ICD-10-CM

## 2014-11-03 DIAGNOSIS — I6523 Occlusion and stenosis of bilateral carotid arteries: Secondary | ICD-10-CM | POA: Insufficient documentation

## 2014-11-03 DIAGNOSIS — R011 Cardiac murmur, unspecified: Secondary | ICD-10-CM | POA: Insufficient documentation

## 2014-11-03 NOTE — Progress Notes (Signed)
Echocardiogram performed.  

## 2014-11-03 NOTE — Progress Notes (Signed)
Carotid duplex performed 

## 2014-11-23 ENCOUNTER — Ambulatory Visit: Payer: 59 | Admitting: Physician Assistant

## 2014-12-02 ENCOUNTER — Other Ambulatory Visit: Payer: Self-pay | Admitting: Family Medicine

## 2014-12-21 ENCOUNTER — Encounter: Payer: Medicare PPO | Admitting: Cardiovascular Disease

## 2014-12-21 ENCOUNTER — Encounter: Payer: Self-pay | Admitting: *Deleted

## 2014-12-21 NOTE — Progress Notes (Signed)
Cancel.

## 2015-01-06 ENCOUNTER — Other Ambulatory Visit: Payer: Self-pay | Admitting: Family Medicine

## 2015-01-06 ENCOUNTER — Other Ambulatory Visit: Payer: Self-pay | Admitting: Physician Assistant

## 2015-01-09 ENCOUNTER — Encounter: Payer: Self-pay | Admitting: Family Medicine

## 2015-01-09 NOTE — Telephone Encounter (Signed)
Medication refill for one time only.  Patient needs to be seen.  Letter sent for patient to call and schedule 

## 2015-02-12 ENCOUNTER — Other Ambulatory Visit: Payer: Self-pay | Admitting: Physician Assistant

## 2015-02-14 ENCOUNTER — Other Ambulatory Visit: Payer: Self-pay | Admitting: Physician Assistant

## 2015-02-15 NOTE — Telephone Encounter (Signed)
Med refilled.

## 2015-03-05 ENCOUNTER — Other Ambulatory Visit: Payer: Self-pay | Admitting: Physician Assistant

## 2015-03-07 NOTE — Telephone Encounter (Signed)
Medication refilled per protocol. 

## 2015-03-08 ENCOUNTER — Encounter: Payer: Self-pay | Admitting: Physician Assistant

## 2015-03-08 ENCOUNTER — Encounter: Payer: Self-pay | Admitting: Family Medicine

## 2015-03-08 ENCOUNTER — Other Ambulatory Visit: Payer: Self-pay | Admitting: Physician Assistant

## 2015-03-08 ENCOUNTER — Ambulatory Visit (INDEPENDENT_AMBULATORY_CARE_PROVIDER_SITE_OTHER): Payer: Medicare PPO | Admitting: Physician Assistant

## 2015-03-08 VITALS — BP 150/82 | HR 84 | Temp 98.4°F | Resp 18 | Wt 176.0 lb

## 2015-03-08 DIAGNOSIS — B9689 Other specified bacterial agents as the cause of diseases classified elsewhere: Principal | ICD-10-CM

## 2015-03-08 DIAGNOSIS — J988 Other specified respiratory disorders: Secondary | ICD-10-CM

## 2015-03-08 MED ORDER — AZITHROMYCIN 250 MG PO TABS: ORAL_TABLET | ORAL | Status: DC

## 2015-03-08 NOTE — Telephone Encounter (Signed)
Medication refill for one time only.  Patient needs to be seen.  Letter sent for patient to call and schedule 

## 2015-03-08 NOTE — Progress Notes (Signed)
    Patient ID: FATIMA FEDIE MRN: [id number], DOB: 1946/03/13, 69 y.o. Date of Encounter: 03/08/2015, 12:48 PM    Chief Complaint:  Chief Complaint  Patient presents with  . Head and chest congestion and cough    Has had for 3 weeks - as soon as she stops the OTC meds it comes back - Alka seltzer sinus & cold med      HPI: 69 y.o. year old white female presents with above symptoms. Says that this is been going on for 3 weeks. Kept thinking it was going to clear up but then it didn't. At this point wishes she had come in 3 weeks ago! Says that she is having a lot of congestion in her head and nose as well as her chest. No fevers or chills. No significant sore throat.     Home Meds:   Outpatient Prescriptions Prior to Visit  Medication Sig Dispense Refill  . amLODipine (NORVASC) 5 MG tablet TAKE 1 TABLET BY MOUTH EVERY DAY. 30 tablet 0  . aspirin EC 81 MG tablet Take 81 mg by mouth daily.    . Calcium Carbonate-Vit D-Min (CALCIUM 1200 PO) Take by mouth. In divided doses    . Cholecalciferol (VITAMIN D3) 2000 UNITS TABS Take 2,000 Int'l Units by mouth daily.    . Coenzyme Q10 10 MG capsule Take 10 mg by mouth daily.    . cyanocobalamin 1000 MCG tablet Take 100 mcg by mouth daily.    Marland Kitchen lisinopril (PRINIVIL,ZESTRIL) 20 MG tablet TAKE 1 TABLET BY MOUTH EVERY DAY 30 tablet 0  . rosuvastatin (CRESTOR) 20 MG tablet TAKE 1 TABLET BY MOUTH EVERY DAY 30 tablet 1  . sertraline (ZOLOFT) 50 MG tablet TAKE 1 TABLET BY MOUTH EVERY DAY. 30 tablet 0   No facility-administered medications prior to visit.    Allergies: No Known Allergies    Review of Systems: See HPI for pertinent ROS. All other ROS negative.    Physical Exam: Blood pressure 150/82, pulse 84, temperature 98.4 F (36.9 C), temperature source Oral, resp. rate 18, weight 176 lb (79.833 kg)., Body mass index is 30.2 kg/(m^2). General:  Very pleasant well-nourished well-developed white female . Appears in no acute distress. HEENT:  Normocephalic, atraumatic, eyes without discharge, sclera non-icteric, nares are without discharge. Bilateral auditory canals clear, TM's are without perforation, pearly grey and translucent with reflective cone of light bilaterally. Oral cavity moist, posterior pharynx without exudate, erythema, peritonsillar abscess. No tenderness with percussion of frontal or maxillary sinuses bilaterally.  Neck: Supple. No thyromegaly. No lymphadenopathy. Lungs: Clear bilaterally to auscultation without wheezes, rales, or rhonchi. Breathing is unlabored. Heart: Regular rhythm. No murmurs, rubs, or gallops. Msk:  Strength and tone normal for age. Extremities/Skin: Warm and dry. Neuro: Alert and oriented X 3. Moves all extremities spontaneously. Gait is normal. CNII-XII grossly in tact. Psych:  Responds to questions appropriately with a normal affect.     ASSESSMENT AND PLAN:  69 y.o. year old female with  1. Bacterial respiratory infection - azithromycin (ZITHROMAX) 250 MG tablet; Day 1: take 2 daily. Days 2-5: Take 1 daily.  Dispense: 6 tablet; Refill: 0 Take antibiotic as directed and complete all of it. Also can continue over-the-counter medication for symptom relief if needed. Follow-up if symptoms do not resolve within 1 week after completion of antibiotic.  [address] [place], [place], [facility] 03/08/2015 12:48 PM

## 2015-03-16 ENCOUNTER — Telehealth: Payer: Self-pay | Admitting: Physician Assistant

## 2015-03-16 MED ORDER — LEVOFLOXACIN 750 MG PO TABS: 750.0000 mg | ORAL_TABLET | Freq: Every day | ORAL | Status: DC

## 2015-03-16 NOTE — Telephone Encounter (Signed)
Levaquin 750mg 1 po QD x 7 days. # 7 + 0. 

## 2015-03-16 NOTE — Telephone Encounter (Signed)
Still with severe congestion in head and chest.  Worse at night.  Has to sit up to sleep.  Secretions from head are greenish/yellow  Very barky cough.

## 2015-03-16 NOTE — Telephone Encounter (Signed)
Pt contacted and made aware of RX.  Rx to pharmacy.  If still not better after this will need to be seen again

## 2015-03-16 NOTE — Telephone Encounter (Signed)
Patient saw Sherry Kent  On the 6th and is still sick would like a call back from you regarding this  [phone]

## 2015-03-22 ENCOUNTER — Encounter: Payer: Self-pay | Admitting: Physician Assistant

## 2015-03-22 ENCOUNTER — Ambulatory Visit (INDEPENDENT_AMBULATORY_CARE_PROVIDER_SITE_OTHER): Payer: Medicare PPO | Admitting: Physician Assistant

## 2015-03-22 VITALS — BP 130/90 | HR 95 | Temp 97.6°F | Resp 18 | Wt 167.0 lb

## 2015-03-22 DIAGNOSIS — J988 Other specified respiratory disorders: Secondary | ICD-10-CM

## 2015-03-22 MED ORDER — PREDNISONE 20 MG PO TABS: ORAL_TABLET | ORAL | Status: DC

## 2015-03-22 MED ORDER — METHYLPREDNISOLONE ACETATE 40 MG/ML IJ SUSP
60.0000 mg | Freq: Once | INTRAMUSCULAR | Status: AC
  Administered 2015-03-22: 60 mg via INTRAMUSCULAR

## 2015-03-22 NOTE — Progress Notes (Signed)
Patient ID: Sherry Kent MRN: [id number], DOB: September 12, 1945, 69 y.o. Date of Encounter: 03/22/2015, 2:21 PM    Chief Complaint:  Chief Complaint  Patient presents with  . OTHER    still sick, has same symptoms as when she was here on 07/06, SOB since last night and chest congestion     HPI: 69 y.o. year old white female presents with above symptoms.  I reviewed her office visit note with me 03/08/15. At that time she reported that she had had head and nasal congestion as well as chest congestion for 3 weeks. She was prescribed azithromycin Z-Pak. She called back 03/16/15 reporting that symptoms had not resolved. Prescribed Levaquin 750 mg daily 7 days. Patient states that she is due to take the last dose of Levaquin today. States that she is having an increased amount of congestion in her head as well as her chest-- both head and chest. Says that at night the cough gets really bad. Says doesn't have much cough during the day. Has had no fevers or chills. No sore throat.     Home Meds:   Outpatient Prescriptions Prior to Visit  Medication Sig Dispense Refill  . amLODipine (NORVASC) 5 MG tablet TAKE 1 TABLET BY MOUTH EVERY DAY 30 tablet 0  . aspirin EC 81 MG tablet Take 81 mg by mouth daily.    . Calcium Carbonate-Vit D-Min (CALCIUM 1200 PO) Take by mouth. In divided doses    . Cholecalciferol (VITAMIN D3) 2000 UNITS TABS Take 2,000 Int'l Units by mouth daily.    . Coenzyme Q10 10 MG capsule Take 10 mg by mouth daily.    . cyanocobalamin 1000 MCG tablet Take 100 mcg by mouth daily.    Marland Kitchen levofloxacin (LEVAQUIN) 750 MG tablet Take 1 tablet (750 mg total) by mouth daily. 7 tablet 0  . lisinopril (PRINIVIL,ZESTRIL) 20 MG tablet TAKE 1 TABLET BY MOUTH EVERY DAY 30 tablet 0  . rosuvastatin (CRESTOR) 20 MG tablet TAKE 1 TABLET BY MOUTH EVERY DAY 30 tablet 1  . sertraline (ZOLOFT) 50 MG tablet TAKE 1 TABLET BY MOUTH EVERY DAY 30 tablet 0   No facility-administered medications prior to  visit.    Allergies: No Known Allergies    Review of Systems: See HPI for pertinent ROS. All other ROS negative.    Physical Exam: Blood pressure 130/90, pulse 95, temperature 97.6 F (36.4 C), temperature source Oral, resp. rate 18, weight 167 lb (75.751 kg), SpO2 94 %., Body mass index is 28.65 kg/(m^2). General: WNWD WF.  Appears in no acute distress. HEENT: Normocephalic, atraumatic, eyes without discharge, sclera non-icteric, nares are without discharge. Bilateral auditory canals clear, TM's are without perforation, pearly grey and translucent with reflective cone of light bilaterally. Oral cavity moist, posterior pharynx without exudate, erythema, peritonsillar abscess. No tenderness with percussion of frontal or maxillary sinuses. However, when she talks, can hear a lot of congestion in her head and nose.  Neck: Supple. No thyromegaly. No lymphadenopathy. Lungs: I hear no wheezes. No rhonchi. Lungs are clear. Oxygen saturation 94% on room air by nurse. Heart: Regular rhythm. No murmurs, rubs, or gallops. Abdomen: Soft, non-tender, non-distended with normoactive bowel sounds. No hepatomegaly. No rebound/guarding. No obvious abdominal masses. Msk:  Strength and tone normal for age. Extremities/Skin: Warm and dry. No clubbing or cyanosis. No edema. No rashes or suspicious lesions. Neuro: Alert and oriented X 3. Moves all extremities spontaneously. Gait is normal. CNII-XII grossly in tact. Psych:  Responds to  questions appropriately with a normal affect.     ASSESSMENT AND PLAN:  69 y.o. year old female with  1. Respiratory tract infection She is to complete the Levaquin. Given that she has been treated with Levaquin and is still having the symptoms and findings that she does, feel that there is an inflammatory component to this. When she talks, she sounds very congested in her head and nose. Will give her Depo-Medrol 60 mg IM now. She will start the oral prednisone taper  tomorrow. Take this as directed. If breathing worsens at all, or is not improved in 24 hours, then follow-up. If breathing does not return to normal baseline in one week, then follow-up as well. - predniSONE (DELTASONE) 20 MG tablet; Take 3 daily for 2 days, then 2 daily for 2 days, then 1 daily for 2 days.  Dispense: 12 tablet; Refill: 0 - methylPREDNISolone acetate (DEPO-MEDROL) injection 60 mg; Inject 1.5 mLs (60 mg total) into the muscle once.   [address] [place], [place], [facility] 03/22/2015 2:21 PM

## 2015-04-09 ENCOUNTER — Other Ambulatory Visit: Payer: Self-pay | Admitting: Physician Assistant

## 2015-04-10 ENCOUNTER — Encounter: Payer: Self-pay | Admitting: Family Medicine

## 2015-04-10 NOTE — Telephone Encounter (Signed)
Medication refill for one time only.  Patient needs to be seen.  Letter sent for patient to call and schedule 

## 2015-05-15 ENCOUNTER — Ambulatory Visit: Payer: Medicare PPO | Admitting: Physician Assistant

## 2015-05-15 ENCOUNTER — Other Ambulatory Visit: Payer: Self-pay | Admitting: Physician Assistant

## 2015-05-16 ENCOUNTER — Encounter: Payer: Self-pay | Admitting: Family Medicine

## 2015-05-16 NOTE — Telephone Encounter (Signed)
Medication refill for one time only.  Patient needs to be seen.  Letter sent for patient to call and schedule 

## 2015-06-05 ENCOUNTER — Ambulatory Visit (INDEPENDENT_AMBULATORY_CARE_PROVIDER_SITE_OTHER): Payer: Medicare PPO | Admitting: Physician Assistant

## 2015-06-05 ENCOUNTER — Encounter: Payer: Self-pay | Admitting: Physician Assistant

## 2015-06-05 VITALS — BP 196/100 | HR 64 | Temp 98.7°F | Resp 18 | Wt 176.0 lb

## 2015-06-05 DIAGNOSIS — F329 Major depressive disorder, single episode, unspecified: Secondary | ICD-10-CM

## 2015-06-05 DIAGNOSIS — Z1239 Encounter for other screening for malignant neoplasm of breast: Secondary | ICD-10-CM | POA: Insufficient documentation

## 2015-06-05 DIAGNOSIS — Z86010 Personal history of colonic polyps: Secondary | ICD-10-CM

## 2015-06-05 DIAGNOSIS — E2839 Other primary ovarian failure: Secondary | ICD-10-CM | POA: Insufficient documentation

## 2015-06-05 DIAGNOSIS — R55 Syncope and collapse: Secondary | ICD-10-CM

## 2015-06-05 DIAGNOSIS — G459 Transient cerebral ischemic attack, unspecified: Secondary | ICD-10-CM | POA: Diagnosis not present

## 2015-06-05 DIAGNOSIS — F410 Panic disorder [episodic paroxysmal anxiety] without agoraphobia: Secondary | ICD-10-CM | POA: Diagnosis not present

## 2015-06-05 DIAGNOSIS — Z780 Asymptomatic menopausal state: Secondary | ICD-10-CM | POA: Insufficient documentation

## 2015-06-05 DIAGNOSIS — E559 Vitamin D deficiency, unspecified: Secondary | ICD-10-CM

## 2015-06-05 DIAGNOSIS — I10 Essential (primary) hypertension: Secondary | ICD-10-CM | POA: Diagnosis not present

## 2015-06-05 DIAGNOSIS — Z23 Encounter for immunization: Secondary | ICD-10-CM

## 2015-06-05 DIAGNOSIS — R739 Hyperglycemia, unspecified: Secondary | ICD-10-CM | POA: Diagnosis not present

## 2015-06-05 DIAGNOSIS — M8580 Other specified disorders of bone density and structure, unspecified site: Secondary | ICD-10-CM | POA: Insufficient documentation

## 2015-06-05 DIAGNOSIS — E785 Hyperlipidemia, unspecified: Secondary | ICD-10-CM

## 2015-06-05 DIAGNOSIS — R7309 Other abnormal glucose: Secondary | ICD-10-CM | POA: Diagnosis not present

## 2015-06-05 DIAGNOSIS — F32A Depression, unspecified: Secondary | ICD-10-CM

## 2015-06-05 HISTORY — DX: Encounter for other screening for malignant neoplasm of breast: Z12.39

## 2015-06-05 LAB — COMPLETE METABOLIC PANEL WITH GFR
ALK PHOS: 111 U/L (ref 33–130)
ALT: 16 U/L (ref 6–29)
AST: 16 U/L (ref 10–35)
Albumin: 4.5 g/dL (ref 3.6–5.1)
BILIRUBIN TOTAL: 0.3 mg/dL (ref 0.2–1.2)
BUN: 20 mg/dL (ref 7–25)
CO2: 27 mmol/L (ref 20–31)
Calcium: 9.7 mg/dL (ref 8.6–10.4)
Chloride: 104 mmol/L (ref 98–110)
Creat: 1.04 mg/dL — ABNORMAL HIGH (ref 0.60–0.93)
GFR, Est African American: 63 mL/min (ref >=60)
GFR, Est Non African American: 55 mL/min — ABNORMAL LOW (ref >=60)
GLUCOSE: 96 mg/dL (ref 70–99)
Potassium: 4.2 mmol/L (ref 3.5–5.3)
SODIUM: 142 mmol/L (ref 135–146)
TOTAL PROTEIN: 7.3 g/dL (ref 6.1–8.1)

## 2015-06-05 LAB — LIPID PANEL
CHOL/HDL RATIO: 4.2 ratio (ref <=5.0)
Cholesterol: 199 mg/dL (ref 125–200)
HDL: 47 mg/dL (ref >=46)
LDL CALC: 105 mg/dL (ref <130)
Triglycerides: 235 mg/dL — ABNORMAL HIGH (ref <150)
VLDL: 47 mg/dL — AB (ref <30)

## 2015-06-05 LAB — HEMOGLOBIN A1C
HEMOGLOBIN A1C: 6.2 % — AB (ref <5.7)
MEAN PLASMA GLUCOSE: 131 mg/dL — AB (ref <117)

## 2015-06-05 MED ORDER — AMLODIPINE BESYLATE 10 MG PO TABS: 10.0000 mg | ORAL_TABLET | Freq: Every day | ORAL | Status: DC

## 2015-06-05 MED ORDER — LISINOPRIL 40 MG PO TABS: 40.0000 mg | ORAL_TABLET | Freq: Every day | ORAL | Status: DC

## 2015-06-05 NOTE — Progress Notes (Signed)
Patient ID: Sherry Kent MRN: [id number], DOB: [birth year], 69 y.o. Date of Encounter: '@DATE' @  Chief Complaint:  Chief Complaint  Patient presents with  . routine check up    is fasting  . Medication Refill    HPI: 69 y.o. year old white female  presents for followup office visit.   Depression: She was seen here on 05/27/13 for a routine office visit. At that [place], we performed a depression screen. This revealed that she was having problems with depression but she just had not dealt with at this she was directly asked these questions she became tearful. She stated that for several years she has been battling with this. She has no children. Her husband passed away 8 years ago. She retired 2 years ago. However she then returned back to her job part time. 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 She stated that she just found no motivation no interest in things that she used to be involved with. He regarding medical issues she had the same problem. For example at prior office visit I discussed carbohydrate handouts with her. However she ended up going home and making no changes. We also discussed scheduling mammogram and bone density test because they were overdue. However she's reported that she had not gone and scheduled these. She said that this was just like everything else and she just did not have the motivation or interest to get this done. Says that this is not the way she used to be. Reportedly she used to be very active and involved in things. Just over the last 2 years she has not been interested or motivated. Says that she was raised to just deal with these things. Says that she probably been having some of these issues with depression ever since her husband passed away but has never dealt with them. Then when she tried to retire she really had difficulty.  At that visit I started Zoloft 50 mg. At f/u OV, she said that she was feeling wonderful. Said that she felt just like she was back to  her old self. Said that she had been cleaning and scrubbing out of the house and has lots of energy . Said her coworkers have even commented that she seems much happier. She said that she is so thankful for this medication and that she has gotten this treated.!!  Today 06/05/15 she states that she is still doing very well with the Zoloft. Again says "I'm a different person with that medicine". Current dose is working well and causing no adverse effects.  H/O Vasovagal Syncope; I reviewed her chart and see that she had office visit with me 10/24/14 regarding syncope. At that that visit I did an EKG as well as lab work. Labs showed that BUN/creatinine were slightly elevated and she was told to stop HCTZ. CBC and TSH were normal and A1c stable at 6.0. She subsequently saw cardiology for evaluation and I have reviewed their note from 10/26/14. At that time they felt that her syncope was most likely vasovagal.  They did obtain 2-D echocardiogram and carotid Dopplers. The echo and carotid Dopplers were both performed 11/03/14 Carotid Dopplers showed mild stenosis repeat one year. Echocardiogram was normal except slight abnormality with relaxation.  Hypertension: She states that she is taking both of her blood pressure medications as directed with no skips doses.  Hyperlipidemia: She is taking the Crestor as directed. No myalgias or out their adverse effects.  She has no complaints or concerns today.  Past Medical History  Diagnosis Date  . Hypertension   . Anxiety   . History of colon polyps   . H/O hematuria   . Hyperlipidemia      Home Meds:  Outpatient Prescriptions Prior to Visit  Medication Sig Dispense Refill  . aspirin EC 81 MG tablet Take 81 mg by mouth daily.    . Calcium Carbonate-Vit D-Min (CALCIUM 1200 PO) Take by mouth. In divided doses    . Cholecalciferol (VITAMIN D3) 2000 UNITS TABS Take 2,000 Int'l Units by mouth daily.    . Coenzyme Q10 10 MG capsule Take 10 mg by mouth  daily.    . cyanocobalamin 1000 MCG tablet Take 100 mcg by mouth daily.    . rosuvastatin (CRESTOR) 20 MG tablet TAKE 1 TABLET BY MOUTH EVERY DAY 30 tablet 1  . sertraline (ZOLOFT) 50 MG tablet TAKE 1 TABLET BY MOUTH DAILY 30 tablet 0  . amLODipine (NORVASC) 5 MG tablet TAKE 1 TABLET BY MOUTH DAILY 30 tablet 0  . lisinopril (PRINIVIL,ZESTRIL) 20 MG tablet TAKE 1 TABLET BY MOUTH EVERY DAY 30 tablet 0  . azithromycin (ZITHROMAX) 250 MG tablet   0  . levofloxacin (LEVAQUIN) 750 MG tablet Take 1 tablet (750 mg total) by mouth daily. 7 tablet 0  . predniSONE (DELTASONE) 20 MG tablet Take 3 daily for 2 days, then 2 daily for 2 days, then 1 daily for 2 days. 12 tablet 0   No facility-administered medications prior to visit.     Allergies: No Known Allergies  Social History   Social History  . Marital Status: Married    Spouse Name: N/A  . Number of Children: 0  . Years of Education: N/A   Occupational History  . 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 Smoking status: Former Smoker -- 1.00 packs/day for 30 years    Types: Cigarettes    Quit date: 10/27/2003  . Smokeless tobacco: Never Used  . Alcohol Use: 0.0 oz/week    0 Standard drinks or equivalent per week     Comment: socially  . Drug Use: No  . Sexual Activity: Not on file   Other Topics Concern  . Not on file   Social History Narrative   Widowed. No children.   Retired.    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   This is where she worked prior to "retirement"     Family History  Problem Relation Age of Onset  . Colon cancer Sister      Review of Systems:  See HPI for pertinent ROS. All other ROS negative.    Physical Exam: Blood pressure 196/100, pulse 64, temperature 98.7 F (37.1 C), temperature source Oral, resp. rate 18, weight 176 lb (79.833 kg)., Body mass index is 30.2 kg/(m^2). General: WNWD WF. Appears in no acute distress. Neck: Supple. No thyromegaly. No  lymphadenopathy.  Lungs: Clear bilaterally to auscultation without wheezes, rales, or rhonchi. Breathing is unlabored. Heart: RRR with S1 S2. No murmurs, rubs, or gallops. Musculoskeletal:  Strength and tone normal for age. Extremities/Skin: Warm and dry.  No edema.  Neuro: Alert and oriented X 3. Moves all extremities spontaneously. Gait is normal. CNII-XII grossly in tact. Psych:  Responds to questions appropriately with a normal affect.     ASSESSMENT AND PLAN:  69 y.o. year old female with   1. Essential hypertension I repeated blood pressure myself and also got high readings.   Left 170/80. Right  180/90. Increase amlodipine from 5 mg to 10 mg. Increase lisinopril from 20 mg to 40 mg. She will have follow-up office visit in 2 weeks to recheck blood pressure and be met on increase meds.  10/24/14--labs showed elevated BUN/creatinine and HCTZ was stopped. Avoid HCTZ in future.  - COMPLETE METABOLIC PANEL WITH GFR - amLODipine (NORVASC) 10 MG tablet; Take 1 tablet (10 mg total) by mouth daily.  Dispense: 30 tablet; Refill: 0 - lisinopril (PRINIVIL,ZESTRIL) 40 MG tablet; Take 1 tablet (40 mg total) by mouth daily.  Dispense: 30 tablet; Refill: 0  2. Hyperlipidemia She is fasting. We'll recheck labs on Crestor. - COMPLETE METABOLIC PANEL WITH GFR - Lipid panel  Hyperglycemia This has been controlled with diet changes and low carbohydrate diet. We'll recheck labs to monitor now. - COMPLETE METABOLIC PANEL WITH GFR - Hemoglobin A1c  H/O Transient cerebral ischemia, unspecified transient cerebral ischemia type This is stable. She has had no recurrent TIA symptoms.  H/O Vasovagal syncope 10/2014 --Status post cardiology evaluation/office visit 10/26/14. --Status post carotid artery Dopplers 11/03/14. Mild disease. Repeat one year. --Status post echocardiogram 11/03/14. Normal systolic function. Normal valves. Minimal relaxation abnormality.   Panic attacks This is controlled with the  Zoloft. Continue Zoloft at current dose.  Depression This is well controlled with Zoloft 50 mg. Continue current treatment.  Vitamin D deficiency She is on vitamin D supplement. We'll recheck vitamin D level today to monitor. - Vit D  25 hydroxy (rtn osteoporosis monitoring)  Osteopenia / Postmenopausal / Estrogen deficiency - DG Bone Density; Future Last bone density scan was performed 08/11/13. Worst T score was -2.1. I went ahead and ordered bone density scan today but made a note for to be after December 10.  Breast cancer screening - MM Digital Screening; Future I went ahead and ordered this today but made a note for to be performed after December 10 at the same date as the bone density scan.  History of colon polyps:  Colonoscopy 03/24/12 revealed polyp. Repeat 5 years.  Immunizations: Influenza vaccine------------------- given here 06/05/2015 T dap-------------------------------------- given here 12/27/11 Pneumonia vaccine--------------- she received Pneumovax 23 12/27/11 ------------------------------------------------- give Prevnar 13 today----06/05/15 Zostavax----------------------------- she states that she did recently find out that her insurance does cover this and she does want to receive this.      Today (06/05/2015) we have informed her of amount of time she needs to wait between each vaccine. Then she will go to the pharmacy and get the Zostavax.  Follow-up office visit 2 weeks to recheck blood pressure and bmet.   Signed, [address] [place], [place], [facility] 06/05/2015 10:20 AM

## 2015-06-06 LAB — VITAMIN D 25 HYDROXY (VIT D DEFICIENCY, FRACTURES): Vit D, 25-Hydroxy: 32 ng/mL (ref 30–100)

## 2015-06-12 ENCOUNTER — Other Ambulatory Visit: Payer: Self-pay | Admitting: Physician Assistant

## 2015-06-13 NOTE — Telephone Encounter (Signed)
Medication refilled per protocol. 

## 2015-06-17 ENCOUNTER — Other Ambulatory Visit: Payer: Self-pay | Admitting: Physician Assistant

## 2015-06-19 ENCOUNTER — Ambulatory Visit: Payer: Medicare PPO | Admitting: Physician Assistant

## 2015-06-19 NOTE — Telephone Encounter (Signed)
Medication refilled per protocol. 

## 2015-06-29 ENCOUNTER — Encounter: Payer: Self-pay | Admitting: Physician Assistant

## 2015-06-29 ENCOUNTER — Ambulatory Visit (INDEPENDENT_AMBULATORY_CARE_PROVIDER_SITE_OTHER): Payer: Medicare PPO | Admitting: Physician Assistant

## 2015-06-29 VITALS — BP 160/80 | HR 66 | Temp 98.8°F | Resp 18 | Wt 178.0 lb

## 2015-06-29 DIAGNOSIS — I10 Essential (primary) hypertension: Secondary | ICD-10-CM

## 2015-06-29 LAB — BASIC METABOLIC PANEL WITH GFR
BUN: 17 mg/dL (ref 7–25)
CHLORIDE: 105 mmol/L (ref 98–110)
CO2: 26 mmol/L (ref 20–31)
CREATININE: 1.11 mg/dL — AB (ref 0.60–0.93)
Calcium: 9.4 mg/dL (ref 8.6–10.4)
GFR, Est African American: 58 mL/min — ABNORMAL LOW (ref >=60)
GFR, Est Non African American: 50 mL/min — ABNORMAL LOW (ref >=60)
GLUCOSE: 113 mg/dL — AB (ref 70–99)
Potassium: 4.1 mmol/L (ref 3.5–5.3)
Sodium: 142 mmol/L (ref 135–146)

## 2015-06-29 MED ORDER — LISINOPRIL 40 MG PO TABS: 40.0000 mg | ORAL_TABLET | Freq: Every day | ORAL | Status: DC

## 2015-06-29 MED ORDER — AMLODIPINE BESYLATE 10 MG PO TABS: 10.0000 mg | ORAL_TABLET | Freq: Every day | ORAL | Status: DC

## 2015-06-29 MED ORDER — CHOLECALCIFEROL 100 MCG (4000 UT) PO CAPS
1.0000 | ORAL_CAPSULE | Freq: Every day | ORAL | Status: DC
Start: 2015-06-29 — End: 2019-09-29

## 2015-06-29 NOTE — Progress Notes (Signed)
Patient ID: PETE MERTEN MRN: [id number], DOB: 1946-01-04, 69 y.o. Date of Encounter: @DATE @  Chief Complaint:  Chief Complaint  Patient presents with  . Hypertension    HPI: 69 y.o. year old white female  presents for followup office visit. She had her last 3 months office visit 06/05/15. Today she is here for a two-week follow-up visit to follow-up hypertension which was uncontrolled at that last visit.   Depression: She was seen here on 05/27/13 for a routine office visit. At that [place], we performed a depression screen. This revealed that she was having problems with depression but she just had not dealt with at this she was directly asked these questions she became tearful. She stated that for several years she has been battling with this. She has no children. Her husband passed away 8 years ago. She retired 2 years ago. However she then returned back to her job part time. 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 She stated that she just found no motivation no interest in things that she used to be involved with. He regarding medical issues she had the same problem. For example at prior office visit I discussed carbohydrate handouts with her. However she ended up going home and making no changes. We also discussed scheduling mammogram and bone density test because they were overdue. However she's reported that she had not gone and scheduled these. She said that this was just like everything else and she just did not have the motivation or interest to get this done. Says that this is not the way she used to be. Reportedly she used to be very active and involved in things. Just over the last 2 years she has not been interested or motivated. Says that she was raised to just deal with these things. Says that she probably been having some of these issues with depression ever since her husband passed away but has never dealt with them. Then when she tried to retire she really had difficulty.  At that  visit I started Zoloft 50 mg. At f/u OV, she said that she was feeling wonderful. Said that she felt just like she was back to her old self. Said that she had been cleaning and scrubbing out of the house and has lots of energy . Said her coworkers have even commented that she seems much happier. She said that she is so thankful for this medication and that she has gotten this treated.!!  Today 06/05/15 she states that she is still doing very well with the Zoloft. Again says "I'm a different person with that medicine". Current dose is working well and causing no adverse effects.  H/O Vasovagal Syncope; I reviewed her chart and see that she had office visit with me 10/24/14 regarding syncope. At that that visit I did an EKG as well as lab work. Labs showed that BUN/creatinine were slightly elevated and she was told to stop HCTZ. CBC and TSH were normal and A1c stable at 6.0. She subsequently saw cardiology for evaluation and I have reviewed their note from 10/26/14. At that time they felt that her syncope was most likely vasovagal.  They did obtain 2-D echocardiogram and carotid Dopplers. The echo and carotid Dopplers were both performed 11/03/14 Carotid Dopplers showed mild stenosis repeat one year. Echocardiogram was normal except slight abnormality with relaxation.  Hyperlipidemia: She is taking the Crestor as directed. No myalgias or out their adverse effects.  Hypertension: At her office visit with me 06/05/15 she reported  that she was taking both of her blood pressure medications daily---  Norvasc 5 mg and Lisinopril 20mg .  However at that visit we got her blood pressure elevated.  I rechecked blood pressure myself at that visit I got 170/80 on the left and 180/90 on the right. At that visit we increased Norvasc to 10 mg and increase lisinopril to 40 mg daily. Today she reports that she is taking both of these medications as directed and is having no adverse effects. No lower extremity  edema.    Past Medical History  Diagnosis Date  . Hypertension   . Anxiety   . History of colon polyps   . H/O hematuria   . Hyperlipidemia      Home Meds:  Outpatient Prescriptions Prior to Visit  Medication Sig Dispense Refill  . amLODipine (NORVASC) 10 MG tablet Take 1 tablet (10 mg total) by mouth daily. 30 tablet 0  . aspirin EC 81 MG tablet Take 81 mg by mouth daily.    . Calcium Carbonate-Vit D-Min (CALCIUM 1200 PO) Take by mouth. In divided doses    . Cholecalciferol (VITAMIN D3) 2000 UNITS TABS Take 2,000 Int'l Units by mouth daily.    . Coenzyme Q10 10 MG capsule Take 10 mg by mouth daily.    . cyanocobalamin 1000 MCG tablet Take 100 mcg by mouth daily.    Marland Kitchen lisinopril (PRINIVIL,ZESTRIL) 40 MG tablet Take 1 tablet (40 mg total) by mouth daily. 30 tablet 0  . rosuvastatin (CRESTOR) 20 MG tablet TAKE 1 TABLET BY MOUTH EVERY DAY 30 tablet 0  . sertraline (ZOLOFT) 50 MG tablet TAKE 1 TABLET BY MOUTH EVERY DAY 90 tablet 1   No facility-administered medications prior to visit.     Allergies: No Known Allergies  Social History   Social History  . Marital Status: Married    Spouse Name: N/A  . Number of Children: 0  . Years of Education: N/A   Occupational History  . 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 Smoking status: Former Smoker -- 1.00 packs/day for 30 years    Types: Cigarettes    Quit date: 10/27/2003  . Smokeless tobacco: Never Used  . Alcohol Use: 0.0 oz/week    0 Standard drinks or equivalent per week     Comment: socially  . Drug Use: No  . Sexual Activity: Not on file   Other Topics Concern  . Not on file   Social History Narrative   Widowed. No children.   Retired.    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   This is where she worked prior to "retirement"     Family History  Problem Relation Age of Onset  . Colon cancer Sister      Review of Systems:  See HPI for pertinent ROS. All other ROS  negative.    Physical Exam: Blood pressure 160/80, pulse 66, temperature 98.8 F (37.1 C), resp. rate 18, weight 178 lb (80.74 kg)., Body mass index is 30.54 kg/(m^2). General: WNWD WF. Appears in no acute distress. Neck: Supple. No thyromegaly. No lymphadenopathy.  Lungs: Clear bilaterally to auscultation without wheezes, rales, or rhonchi. Breathing is unlabored. Heart: RRR with S1 S2. No murmurs, rubs, or gallops. Musculoskeletal:  Strength and tone normal for age. Extremities/Skin: Warm and dry.  No edema.  Neuro: Alert and oriented X 3. Moves all extremities spontaneously. Gait is normal. CNII-XII grossly in tact. Psych:  Responds to questions appropriately with  a normal affect.     ASSESSMENT AND PLAN:  69 y.o. year old female with   1. Essential hypertension  At this time will continue current medications of Norvasc 10 mg and lisinopril 40 mg. She is having absolutely no lower extremity edema even on 10 mg Norvasc. Will check lab to monitor.  10/24/14--labs showed elevated BUN/creatinine and HCTZ was stopped. Avoid HCTZ in future.   2. Hyperlipidemia On Crestor. Last FLP LFT 06/05/15  Hyperglycemia This has been controlled with diet changes and low carbohydrate diet. Last A1C 06/05/2015  H/O Transient cerebral ischemia, unspecified transient cerebral ischemia type This is stable. She has had no recurrent TIA symptoms.  H/O Vasovagal syncope 10/2014 --Status post cardiology evaluation/office visit 10/26/14. --Status post carotid artery Dopplers 11/03/14. Mild disease. Repeat one year. --Status post echocardiogram 11/03/14. Normal systolic function. Normal valves. Minimal relaxation abnormality.   Panic attacks This is controlled with the Zoloft. Continue Zoloft at current dose.  Depression This is well controlled with Zoloft 50 mg. Continue current treatment.  Vitamin D deficiency She is on vitamin D supplement. Recheck vitamin D level 06/05/15. At that time told her  to increase supplement from 2000 units daily to 4000 units daily. At follow-up visit 06/29/15 she reports that she has increased this and is taking the 4,000 units daily.  Osteopenia / Postmenopausal / Estrogen deficiency - DG Bone Density; Future Last bone density scan was performed 08/11/13. Worst T score was -2.1. I went ahead and ordered bone density scan today but made a note for to be after December 10.  Breast cancer screening - MM Digital Screening; Future I went ahead and ordered this today but made a note for to be performed after December 10 at the same date as the bone density scan.  History of colon polyps:  Colonoscopy 03/24/12 revealed polyp. Repeat 5 years.  Immunizations: Influenza vaccine------------------- given here 06/05/2015 T dap-------------------------------------- given here 12/27/11 Pneumonia vaccine--------------- she received Pneumovax 23 12/27/11 ------------------------------------------------- give Prevnar 13 today----06/05/15 Zostavax----------------------------- she states that she did recently find out that her insurance does cover this and she does want to receive this.      Today (06/05/2015) we have informed her of amount of time she needs to wait between each vaccine. Then she will go to the pharmacy and get the Zostavax.  Follow-up office visit 3 months.   Signed, [address] [place], [place], [facility] 06/29/2015 9:32 AM

## 2015-07-06 ENCOUNTER — Encounter: Payer: Self-pay | Admitting: Physician Assistant

## 2015-07-06 ENCOUNTER — Ambulatory Visit (INDEPENDENT_AMBULATORY_CARE_PROVIDER_SITE_OTHER): Payer: Medicare PPO | Admitting: Physician Assistant

## 2015-07-06 VITALS — BP 126/70 | HR 76 | Temp 98.7°F | Resp 18 | Wt 173.0 lb

## 2015-07-06 DIAGNOSIS — R112 Nausea with vomiting, unspecified: Secondary | ICD-10-CM

## 2015-07-06 DIAGNOSIS — R059 Cough, unspecified: Secondary | ICD-10-CM

## 2015-07-06 DIAGNOSIS — R05 Cough: Secondary | ICD-10-CM | POA: Diagnosis not present

## 2015-07-06 MED ORDER — OMEPRAZOLE 20 MG PO CPDR
20.0000 mg | DELAYED_RELEASE_CAPSULE | Freq: Every day | ORAL | Status: DC
Start: 2015-07-06 — End: 2015-11-13

## 2015-07-06 MED ORDER — CETIRIZINE HCL 10 MG PO TABS: 10.0000 mg | ORAL_TABLET | Freq: Every day | ORAL | Status: DC

## 2015-07-07 LAB — CBC WITH DIFFERENTIAL/PLATELET
BASOS PCT: 1 % (ref 0–1)
Basophils Absolute: 0.1 10*3/uL (ref 0.0–0.1)
EOS PCT: 4 % (ref 0–5)
Eosinophils Absolute: 0.4 10*3/uL (ref 0.0–0.7)
HEMATOCRIT: 37 % (ref 36.0–46.0)
HEMOGLOBIN: 12 g/dL (ref 12.0–15.0)
Lymphocytes Relative: 31 % (ref 12–46)
Lymphs Abs: 3.2 10*3/uL (ref 0.7–4.0)
MCH: 27.8 pg (ref 26.0–34.0)
MCHC: 32.4 g/dL (ref 30.0–36.0)
MCV: 85.6 fL (ref 78.0–100.0)
MONO ABS: 0.7 10*3/uL (ref 0.1–1.0)
MONOS PCT: 7 % (ref 3–12)
MPV: 9 fL (ref 8.6–12.4)
NEUTROS ABS: 5.9 10*3/uL (ref 1.7–7.7)
Neutrophils Relative %: 57 % (ref 43–77)
Platelets: 314 10*3/uL (ref 150–400)
RBC: 4.32 MIL/uL (ref 3.87–5.11)
RDW: 15.6 % — AB (ref 11.5–15.5)
WBC: 10.4 10*3/uL (ref 4.0–10.5)

## 2015-07-07 LAB — COMPLETE METABOLIC PANEL WITH GFR
ALBUMIN: 4 g/dL (ref 3.6–5.1)
ALK PHOS: 92 U/L (ref 33–130)
ALT: 10 U/L (ref 6–29)
AST: 11 U/L (ref 10–35)
BUN: 29 mg/dL — ABNORMAL HIGH (ref 7–25)
CALCIUM: 9.6 mg/dL (ref 8.6–10.4)
CO2: 23 mmol/L (ref 20–31)
Chloride: 106 mmol/L (ref 98–110)
Creat: 1.16 mg/dL — ABNORMAL HIGH (ref 0.60–0.93)
GFR, EST NON AFRICAN AMERICAN: 48 mL/min — AB (ref >=60)
GFR, Est African American: 55 mL/min — ABNORMAL LOW (ref >=60)
Glucose, Bld: 91 mg/dL (ref 70–99)
POTASSIUM: 4.1 mmol/L (ref 3.5–5.3)
SODIUM: 139 mmol/L (ref 135–146)
Total Bilirubin: 0.4 mg/dL (ref 0.2–1.2)
Total Protein: 6.6 g/dL (ref 6.1–8.1)

## 2015-07-07 LAB — H. PYLORI BREATH TEST: H. pylori Breath Test: NOT DETECTED

## 2015-07-07 LAB — TSH: TSH: 0.802 u[IU]/mL (ref 0.350–4.500)

## 2015-07-07 NOTE — Progress Notes (Signed)
Patient ID: GABRIANA WILMOTT MRN: [id number], DOB: 08-03-1946, 69 y.o. Date of Encounter: @DATE @  Chief Complaint:  Chief Complaint  Patient presents with  . bad cough and congestion    morning bouts of nausea and dry heaves    HPI: 69 y.o. year old white female  presents with above symptoms.   She states that this morning he got up took her shower but then she felt nauseous to the point that she needed to vomit but there was she had not eaten anything yet to that was nothing for her to throw up. Later in the day she would sit down etc. but any time she would try any food it would come back up. Says that this is been going on for a few weeks now. Says that it used to be occasional. Now every few days she is having these type symptoms over the past couple weeks it has been every few days. Says that for example for this week on to use day she felt sick all day. Wednesday she felt normal and fine and had no nausea or any other symptoms. Says that this morning she had the symptoms above and inset again just a few minutes ago while in the waiting room she felt nausea for a few minutes and then it past and resolved. Says that through all of this time she has had no abdominal pain at all. She has not taken any type of medications to try to treat this. (no PPIs etc.) Stools have been normal. No diarrhea. No constipation. As had no fevers or chills. Appetite has been normal she has been eating like normal.  She also states that she has had a cough. Says that she had an office visit here around June at which time she was having congestion. Says that she has continued to have these symptoms intermittently since then. Says that about once a week she will wake up with congestion. On those days she will have chest congestion and cough all day throughout the day. However she will then have no symptoms the following day. Says that on average is about every 2 or 3 days that she will have this congestion.  Says that for example today she has had no congestion at all no cough no symptoms. Has used no over-the-counter medications to try to treat this. Says that on the days that she does have the congestion and cough that sounds very deep and does not seem to be a dry tacky cough as would be expected with lisinopril--scuffed with her that she is on lisinopril and sometimes these medicines can cause dry tacky cough but she really does not think that that's the type of symptoms she is having.    Past Medical History  Diagnosis Date  . Hypertension   . Anxiety   . History of colon polyps   . H/O hematuria   . Hyperlipidemia      Home Meds: Outpatient Prescriptions Prior to Visit  Medication Sig Dispense Refill  . amLODipine (NORVASC) 10 MG tablet Take 1 tablet (10 mg total) by mouth daily. 90 tablet 1  . aspirin EC 81 MG tablet Take 81 mg by mouth daily.    . Calcium Carbonate-Vit D-Min (CALCIUM 1200 PO) Take by mouth. In divided doses    . Cholecalciferol (SM VITAMIN D3) 4000 UNITS CAPS Take 1 capsule (4,000 Units total) by mouth daily. 30 capsule 11  . Coenzyme Q10 10 MG capsule Take 10 mg by mouth  daily.    . cyanocobalamin 1000 MCG tablet Take 100 mcg by mouth daily.    Marland Kitchen lisinopril (PRINIVIL,ZESTRIL) 40 MG tablet Take 1 tablet (40 mg total) by mouth daily. 90 tablet 1  . rosuvastatin (CRESTOR) 20 MG tablet TAKE 1 TABLET BY MOUTH EVERY DAY 30 tablet 0  . sertraline (ZOLOFT) 50 MG tablet TAKE 1 TABLET BY MOUTH EVERY DAY 90 tablet 1   No facility-administered medications prior to visit.    Allergies: No Known Allergies  Social History   Social History  . Marital Status: Married    Spouse Name: N/A  . Number of Children: 0  . Years of Education: N/A   Occupational History  . 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 Smoking status: Former Smoker -- 1.00 packs/day for 30 years    Types: Cigarettes    Quit date: 10/27/2003  . Smokeless tobacco: Never Used  .  Alcohol Use: 0.0 oz/week    0 Standard drinks or equivalent per week     Comment: socially  . Drug Use: No  . Sexual Activity: Not on file   Other Topics Concern  . Not on file   Social History Narrative   Widowed. No children.   Retired.    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   This is where she worked prior to "retirement"     Family History  Problem Relation Age of Onset  . Colon cancer Sister      Review of Systems:  See HPI for pertinent ROS. All other ROS negative.    Physical Exam: Blood pressure 126/70, pulse 76, temperature 98.7 F (37.1 C), temperature source Oral, resp. rate 18, weight 173 lb (78.472 kg)., Body mass index is 29.68 kg/(m^2). General: WNWD WF. Appears in no acute distress. Head: Normocephalic, atraumatic, eyes without discharge, sclera non-icteric, nares are without discharge. Bilateral auditory canals clear, TM's are without perforation, pearly grey and translucent with reflective cone of light bilaterally. Oral cavity moist, posterior pharynx without exudate, erythema, peritonsillar abscess. No tenderness with percussion of frontal or maxillary sinuses bilaterally.  Neck: Supple. No thyromegaly. No lymphadenopathy. Lungs: Clear bilaterally to auscultation without wheezes, rales, or rhonchi. Breathing is unlabored. Heart: RRR with S1 S2. No murmurs, rubs, or gallops. Abdomen: Soft, non-tender, non-distended with normoactive bowel sounds. No hepatomegaly. No rebound/guarding. No obvious abdominal masses. No areas of tenderness at all. Musculoskeletal:  Strength and tone normal for age. Extremities/Skin: Warm and dry.  Neuro: Alert and oriented X 3. Moves all extremities spontaneously. Gait is normal. CNII-XII grossly in tact. Psych:  Responds to questions appropriately with a normal affect.     ASSESSMENT AND PLAN:  69 y.o. year old female with  1. Nausea and vomiting, intractability of vomiting not specified, unspecified  vomiting type I suspect that she may have H pylori. We'll check a low labs and H. pylori test and then follow-up with patient with further recommendations. Go ahead and start empiric PPI. If labs including H. pylori are normal and her symptoms persist after adding PPI then will refer to GI. - CBC with Differential/Platelet - COMPLETE METABOLIC PANEL WITH GFR - TSH - H. pylori breath test - omeprazole (PRILOSEC) 20 MG capsule; Take 1 capsule (20 mg total) by mouth daily.  Dispense: 30 capsule; Refill: 3  2. Cough She will add Ceretec daily. If cough and congestion persist then will change ACE inhibitor to ARB, get chest x-ray and further evaluate. - cetirizine (  ZYRTEC) 10 MG tablet; Take 1 tablet (10 mg total) by mouth daily.  Dispense: 30 tablet; Refill: [address] [place], [place], [facility] 07/07/2015 11:54 AM

## 2015-07-12 ENCOUNTER — Other Ambulatory Visit: Payer: Self-pay | Admitting: Family Medicine

## 2015-07-12 DIAGNOSIS — R112 Nausea with vomiting, unspecified: Secondary | ICD-10-CM

## 2015-07-18 ENCOUNTER — Ambulatory Visit: Payer: Medicare PPO | Admitting: Internal Medicine

## 2015-07-19 ENCOUNTER — Ambulatory Visit (INDEPENDENT_AMBULATORY_CARE_PROVIDER_SITE_OTHER): Payer: Medicare PPO | Admitting: Physician Assistant

## 2015-07-19 ENCOUNTER — Encounter: Payer: Self-pay | Admitting: Physician Assistant

## 2015-07-19 VITALS — BP 132/76 | HR 76 | Temp 98.0°F | Resp 18 | Wt 178.0 lb

## 2015-07-19 DIAGNOSIS — R600 Localized edema: Secondary | ICD-10-CM

## 2015-07-19 DIAGNOSIS — I10 Essential (primary) hypertension: Secondary | ICD-10-CM | POA: Diagnosis not present

## 2015-07-19 MED ORDER — NEBIVOLOL HCL 5 MG PO TABS: 5.0000 mg | ORAL_TABLET | Freq: Every day | ORAL | Status: DC

## 2015-07-19 MED ORDER — AMLODIPINE BESYLATE 5 MG PO TABS: 5.0000 mg | ORAL_TABLET | Freq: Every day | ORAL | Status: DC

## 2015-07-19 NOTE — Progress Notes (Addendum)
Patient ID: HELDANA RODRIGUES MRN: [id number], DOB: [birth year], 69 y.o. Date of Encounter: @DATE @  Chief Complaint:  Chief Complaint  Patient presents with  . swollen feet x 1 week    getting worse    HPI: 69 y.o. year old white female  presents for followup office visit. She had her last 3 months office visit 06/05/15. She then had a two-week follow-up visit to follow-up hypertension which was uncontrolled at that last visit.  Today she returns that she has recently been having swelling in her feet. Says that it was to where she would have some swelling but then it would go down. Says that over the past week the swelling has not been going down. No increased shortness of breath. No increased dyspnea on exertion. No orthopnea. No cough or chest congestion.   Depression: She was seen here on 05/27/13 for a routine office visit. At that [place], we performed a depression screen. This revealed that she was having problems with depression but she just had not dealt with at this she was directly asked these questions she became tearful. She stated that for several years she has been battling with this. She has no children. Her husband passed away 8 years ago. She retired 2 years ago. However she then returned back to her job part time. 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 She stated that she just found no motivation no interest in things that she used to be involved with. He regarding medical issues she had the same problem. For example at prior office visit I discussed carbohydrate handouts with her. However she ended up going home and making no changes. We also discussed scheduling mammogram and bone density test because they were overdue. However she's reported that she had not gone and scheduled these. She said that this was just like everything else and she just did not have the motivation or interest to get this done. Says that this is not the way she used to be. Reportedly she used to be very active and  involved in things. Just over the last 2 years she has not been interested or motivated. Says that she was raised to just deal with these things. Says that she probably been having some of these issues with depression ever since her husband passed away but has never dealt with them. Then when she tried to retire she really had difficulty.  At that visit I started Zoloft 50 mg. At f/u OV, she said that she was feeling wonderful. Said that she felt just like she was back to her old self. Said that she had been cleaning and scrubbing out of the house and has lots of energy . Said her coworkers have even commented that she seems much happier. She said that she is so thankful for this medication and that she has gotten this treated.!!  Today 06/05/15 she states that she is still doing very well with the Zoloft. Again says "I'm a different person with that medicine". Current dose is working well and causing no adverse effects.  H/O Vasovagal Syncope; I reviewed her chart and see that she had office visit with me 10/24/14 regarding syncope. At that that visit I did an EKG as well as lab work. Labs showed that BUN/creatinine were slightly elevated and she was told to stop HCTZ. CBC and TSH were normal and A1c stable at 6.0. She subsequently saw cardiology for evaluation and I have reviewed their note from 10/26/14. At that time they felt  that her syncope was most likely vasovagal.  They did obtain 2-D echocardiogram and carotid Dopplers. The echo and carotid Dopplers were both performed 11/03/14 Carotid Dopplers showed mild stenosis repeat one year. Echocardiogram was normal except slight abnormality with relaxation.  Hyperlipidemia: She is taking the Crestor as directed. No myalgias or out their adverse effects.  Hypertension: At her office visit with me 06/05/15 she reported that she was taking both of her blood pressure medications daily---  Norvasc 5 mg and Lisinopril 20mg .  However at that visit we got  her blood pressure elevated.  I rechecked blood pressure myself at that visit I got 170/80 on the left and 180/90 on the right. At that visit we increased Norvasc to 10 mg and increase lisinopril to 40 mg daily. See HPI from [facility] 07/19/2015 regarding swelling in the feet. At that visit 07/19/2015 Norvasc was decreased to 5 mg and Bystolic 5 mg daily was added.    Past Medical History  Diagnosis Date  . Hypertension   . Anxiety   . History of colon polyps   . H/O hematuria   . Hyperlipidemia      Home Meds:  Outpatient Prescriptions Prior to Visit  Medication Sig Dispense Refill  . aspirin EC 81 MG tablet Take 81 mg by mouth daily.    . Calcium Carbonate-Vit D-Min (CALCIUM 1200 PO) Take by mouth. In divided doses    . cetirizine (ZYRTEC) 10 MG tablet Take 1 tablet (10 mg total) by mouth daily. 30 tablet 11  . Cholecalciferol (SM VITAMIN D3) 4000 UNITS CAPS Take 1 capsule (4,000 Units total) by mouth daily. 30 capsule 11  . Coenzyme Q10 10 MG capsule Take 10 mg by mouth daily.    . cyanocobalamin 1000 MCG tablet Take 100 mcg by mouth daily.    Marland Kitchen lisinopril (PRINIVIL,ZESTRIL) 40 MG tablet Take 1 tablet (40 mg total) by mouth daily. 90 tablet 1  . omeprazole (PRILOSEC) 20 MG capsule Take 1 capsule (20 mg total) by mouth daily. 30 capsule 3  . rosuvastatin (CRESTOR) 20 MG tablet TAKE 1 TABLET BY MOUTH EVERY DAY 30 tablet 0  . sertraline (ZOLOFT) 50 MG tablet TAKE 1 TABLET BY MOUTH EVERY DAY 90 tablet 1  . amLODipine (NORVASC) 10 MG tablet Take 1 tablet (10 mg total) by mouth daily. 90 tablet 1   No facility-administered medications prior to visit.     Allergies: No Known Allergies  Social History   Social History  . Marital Status: Married    Spouse Name: N/A  . Number of Children: 0  . Years of Education: N/A   Occupational History  . 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 Smoking status: Former Smoker -- 1.00 packs/day for 30 years    Types: Cigarettes     Quit date: 10/27/2003  . Smokeless tobacco: Never Used  . Alcohol Use: 0.0 oz/week    0 Standard drinks or equivalent per week     Comment: socially  . Drug Use: No  . Sexual Activity: Not on file   Other Topics Concern  . Not on file   Social History Narrative   Widowed. No children.   Retired.    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   This is where she worked prior to "retirement"     Family History  Problem Relation Age of Onset  . Colon cancer Sister      Review of Systems:  See HPI  for pertinent ROS. All other ROS negative.    Physical Exam: Blood pressure 132/76, pulse 76, temperature 98 F (36.7 C), temperature source Oral, resp. rate 18, weight 178 lb (80.74 kg)., Body mass index is 30.54 kg/(m^2). General: WNWD WF. Appears in no acute distress. Neck: Supple. No thyromegaly. No lymphadenopathy.  Lungs: Clear bilaterally to auscultation without wheezes, rales, or rhonchi. Breathing is unlabored. Heart: RRR with S1 S2. No murmurs, rubs, or gallops. Musculoskeletal:  Strength and tone normal for age. Extremities/Skin: Warm and dry.  Puffy swelling of feet bilaterally. Swelling does not extend up beyond ankle level.  Neuro: Alert and oriented X 3. Moves all extremities spontaneously. Gait is normal. CNII-XII grossly in tact. Psych:  Responds to questions appropriately with a normal affect.     ASSESSMENT AND PLAN:  69 y.o. year old female with   1. Essential hypertension  07/19/2015--- bilateral lower extremity edema--- Norvasc decreased from 10 mg down to 5 mg. Bystolic 5 mg daily added to keep blood pressure controlled.  10/24/14--labs showed elevated BUN/creatinine and HCTZ was stopped. Avoid HCTZ in future.   2. Hyperlipidemia On Crestor. Last FLP LFT 06/05/15  Hyperglycemia This has been controlled with diet changes and low carbohydrate diet. Last A1C 06/05/2015  H/O Transient cerebral ischemia, unspecified transient cerebral  ischemia type This is stable. She has had no recurrent TIA symptoms.  H/O Vasovagal syncope 10/2014 --Status post cardiology evaluation/office visit 10/26/14. --Status post carotid artery Dopplers 11/03/14. Mild disease. Repeat one year. --Status post echocardiogram 11/03/14. Normal systolic function. Normal valves. Minimal relaxation abnormality.   Panic attacks This is controlled with the Zoloft. Continue Zoloft at current dose.  Depression This is well controlled with Zoloft 50 mg. Continue current treatment.  Vitamin D deficiency She is on vitamin D supplement. Recheck vitamin D level 06/05/15. At that time told her to increase supplement from 2000 units daily to 4000 units daily. At follow-up visit 06/29/15 she reports that she has increased this and is taking the 4,000 units daily.  Osteopenia / Postmenopausal / Estrogen deficiency - DG Bone Density; Future Last bone density scan was performed 08/11/13. Worst T score was -2.1. I went ahead and ordered bone density scan today but made a note for to be after December 10. ADDENDUM--ADDED 08/17/2015; Worst T score -2.0. Reviewed that on her prior DEXA 08/11/13 T-scores were -2.1 and -0.9. Therefore current findings are stable. She is already taking calcium 1200 mg per day and vitamin D 4000 units daily. To continue these. Reviewed her other risk factors for developing osteoporosis. She quit smoking in 2005. I do not see any documentation of her having any type of fractures but I will review this with her at her next visit. Also I will discuss with her any family history of osteoporosis/bone fractures. For now, she will continue the calcium and vitamin D.  Breast cancer screening - MM Digital Screening; Future I went ahead and ordered this today but made a note for to be performed after December 10 at the same date as the bone density scan.  History of colon polyps:  Colonoscopy 03/24/12 revealed polyp. Repeat 5  years.  Immunizations: Influenza vaccine------------------- given here 06/05/2015 T dap-------------------------------------- given here 12/27/11 Pneumonia vaccine--------------- she received Pneumovax 23 12/27/11 ------------------------------------------------- give Prevnar 13 today----06/05/15 Zostavax----------------------------- she states that she did recently find out that her insurance does cover this and she does want to receive this.      Today (06/05/2015) we have informed her of amount of time  she needs to wait between each vaccine. Then she will go to the pharmacy and get the Zostavax.  Follow-up office visit 3 months.   Marin Olp [place], [place], [facility] 07/19/2015 2:19 PM

## 2015-08-15 ENCOUNTER — Ambulatory Visit
Admission: RE | Admit: 2015-08-15 | Discharge: 2015-08-15 | Disposition: A | Discharge status: Home / Self Care | Payer: Medicare PPO | Source: Ambulatory Visit | Attending: Physician Assistant | Admitting: Physician Assistant

## 2015-08-15 DIAGNOSIS — Z780 Asymptomatic menopausal state: Secondary | ICD-10-CM

## 2015-08-15 DIAGNOSIS — Z1239 Encounter for other screening for malignant neoplasm of breast: Secondary | ICD-10-CM

## 2015-08-15 DIAGNOSIS — E2839 Other primary ovarian failure: Secondary | ICD-10-CM

## 2015-08-15 DIAGNOSIS — M85852 Other specified disorders of bone density and structure, left thigh: Secondary | ICD-10-CM | POA: Diagnosis not present

## 2015-08-15 DIAGNOSIS — Z1231 Encounter for screening mammogram for malignant neoplasm of breast: Secondary | ICD-10-CM | POA: Diagnosis not present

## 2015-08-15 DIAGNOSIS — M8580 Other specified disorders of bone density and structure, unspecified site: Secondary | ICD-10-CM

## 2015-09-07 ENCOUNTER — Encounter: Payer: Self-pay | Admitting: Physician Assistant

## 2015-09-07 ENCOUNTER — Ambulatory Visit (INDEPENDENT_AMBULATORY_CARE_PROVIDER_SITE_OTHER): Payer: Medicare Other | Admitting: Physician Assistant

## 2015-09-07 VITALS — BP 160/90 | HR 60 | Temp 98.1°F | Resp 18 | Wt 170.0 lb

## 2015-09-07 DIAGNOSIS — J988 Other specified respiratory disorders: Secondary | ICD-10-CM | POA: Diagnosis not present

## 2015-09-07 DIAGNOSIS — B9689 Other specified bacterial agents as the cause of diseases classified elsewhere: Principal | ICD-10-CM

## 2015-09-07 MED ORDER — AZITHROMYCIN 250 MG PO TABS: ORAL_TABLET | ORAL | Status: DC

## 2015-09-07 NOTE — Progress Notes (Signed)
Patient ID: Sherry Kent MRN: [id number], DOB: 1946-05-29, 70 y.o. Date of Encounter: 09/07/2015, 10:47 AM    Chief Complaint:  Chief Complaint  Patient presents with  . sick x 4 days    sinus/chest congestion, green secretions     HPI: 70 y.o. year old white female symptoms started Sunday afternoon which was 1/1. At that time throat felt scratchy throat never did get sore. Then it started going to head and nasal congestion. He has had some slight cough and can tell her chest rattles. Feels that she has a lot of head and nasal congestion and a lot of dark thick mucus from the nose. No known fevers or chills.     Home Meds:   Outpatient Prescriptions Prior to Visit  Medication Sig Dispense Refill  . amLODipine (NORVASC) 5 MG tablet Take 1 tablet (5 mg total) by mouth daily. 30 tablet 3  . aspirin EC 81 MG tablet Take 81 mg by mouth daily.    . Calcium Carbonate-Vit D-Min (CALCIUM 1200 PO) Take by mouth. In divided doses    . cetirizine (ZYRTEC) 10 MG tablet Take 1 tablet (10 mg total) by mouth daily. 30 tablet 11  . Cholecalciferol (SM VITAMIN D3) 4000 UNITS CAPS Take 1 capsule (4,000 Units total) by mouth daily. 30 capsule 11  . Coenzyme Q10 10 MG capsule Take 10 mg by mouth daily.    . cyanocobalamin 1000 MCG tablet Take 100 mcg by mouth daily.    Marland Kitchen lisinopril (PRINIVIL,ZESTRIL) 40 MG tablet Take 1 tablet (40 mg total) by mouth daily. 90 tablet 1  . nebivolol (BYSTOLIC) 5 MG tablet Take 1 tablet (5 mg total) by mouth daily. 30 tablet 3  . omeprazole (PRILOSEC) 20 MG capsule Take 1 capsule (20 mg total) by mouth daily. 30 capsule 3  . rosuvastatin (CRESTOR) 20 MG tablet TAKE 1 TABLET BY MOUTH EVERY DAY 30 tablet 0  . sertraline (ZOLOFT) 50 MG tablet TAKE 1 TABLET BY MOUTH EVERY DAY 90 tablet 1   No facility-administered medications prior to visit.    Allergies:  Allergies  Allergen Reactions  . Norvasc [Amlodipine Besylate] Swelling    10mg  dose causes swelling  (07/19/2015).  Tolerates 5mg  with no swelling.      Review of Systems: See HPI for pertinent ROS. All other ROS negative.    Physical Exam: Blood pressure 160/90, pulse 60, temperature 98.1 F (36.7 C), temperature source Oral, resp. rate 18, weight 170 lb (77.111 kg)., Body mass index is 29.17 kg/(m^2). General:  WNWD WF. Appears in no acute distress. HEENT: Normocephalic, atraumatic, eyes without discharge, sclera non-icteric, nares are without discharge. Bilateral auditory canals clear, TM's are without perforation, pearly grey and translucent with reflective cone of light bilaterally. Oral cavity moist, posterior pharynx without exudate, erythema, peritonsillar abscess. No tenderness with percussion to frontal or maxillary sinuses bilaterally.  Neck: Supple. No thyromegaly. No lymphadenopathy. Lungs: Clear bilaterally to auscultation without wheezes, rales, or rhonchi. Breathing is unlabored. Heart: Regular rhythm. No murmurs, rubs, or gallops. Msk:  Strength and tone normal for age. Extremities/Skin: Warm and dry. Neuro: Alert and oriented X 3. Moves all extremities spontaneously. Gait is normal. CNII-XII grossly in tact. Psych:  Responds to questions appropriately with a normal affect.     ASSESSMENT AND PLAN:  70 y.o. year old female with  1. Bacterial respiratory infection She is to take antibiotic as directed. Can use over-the-counter medicines as needed for cough relief and decongestant. Follow-up if  symptoms do not resolve within 1 week after completion of antibiotic. - azithromycin (ZITHROMAX) 250 MG tablet; Day 1: Take 2 daily. Days 2-5: Take 1 daily.  Dispense: 6 tablet; Refill: 0   Signed, [address] [place], [place], [facility] 09/07/2015 10:47 AM

## 2015-10-27 ENCOUNTER — Other Ambulatory Visit: Payer: Self-pay | Admitting: Cardiovascular Disease

## 2015-10-27 DIAGNOSIS — I6523 Occlusion and stenosis of bilateral carotid arteries: Secondary | ICD-10-CM

## 2015-11-06 ENCOUNTER — Inpatient Hospital Stay (HOSPITAL_COMMUNITY)
Admission: RE | Admit: 2015-11-06 | Discharge: 2015-11-06 | Disposition: A | Discharge status: Home / Self Care | Payer: Medicare PPO | Source: Ambulatory Visit

## 2015-11-06 DIAGNOSIS — I6523 Occlusion and stenosis of bilateral carotid arteries: Secondary | ICD-10-CM

## 2015-11-13 ENCOUNTER — Other Ambulatory Visit: Payer: Self-pay | Admitting: Physician Assistant

## 2015-11-13 ENCOUNTER — Encounter: Payer: Self-pay | Admitting: Family Medicine

## 2015-11-13 NOTE — Telephone Encounter (Signed)
Medication refill for one time only.  Patient needs to be seen.  Letter sent for patient to call and schedule 

## 2015-11-16 ENCOUNTER — Encounter (HOSPITAL_COMMUNITY): Payer: Self-pay | Admitting: Cardiovascular Disease

## 2015-11-20 ENCOUNTER — Encounter (HOSPITAL_COMMUNITY): Payer: Self-pay | Admitting: Physician Assistant

## 2015-12-20 ENCOUNTER — Other Ambulatory Visit: Payer: Self-pay | Admitting: Physician Assistant

## 2015-12-20 ENCOUNTER — Other Ambulatory Visit: Payer: Self-pay | Admitting: Family Medicine

## 2015-12-20 ENCOUNTER — Encounter: Payer: Self-pay | Admitting: Family Medicine

## 2015-12-20 MED ORDER — AMLODIPINE BESYLATE 5 MG PO TABS: ORAL_TABLET | ORAL | Status: DC

## 2015-12-20 NOTE — Telephone Encounter (Signed)
Medication refill for one time only.  Patient needs to be seen.  Letter sent for patient to call and schedule 

## 2015-12-29 ENCOUNTER — Other Ambulatory Visit: Payer: Self-pay | Admitting: Physician Assistant

## 2015-12-29 NOTE — Telephone Encounter (Signed)
Refill appropriate and filled per protocol. 

## 2016-01-08 ENCOUNTER — Other Ambulatory Visit: Payer: Self-pay | Admitting: Physician Assistant

## 2016-01-09 ENCOUNTER — Encounter: Payer: Self-pay | Admitting: Family Medicine

## 2016-01-09 NOTE — Telephone Encounter (Signed)
Medication refill for one time only.  Patient needs to be seen.  Letter sent for patient to call and schedule 

## 2016-01-11 ENCOUNTER — Ambulatory Visit: Payer: Medicare Other | Admitting: Physician Assistant

## 2016-01-13 ENCOUNTER — Encounter: Payer: Self-pay | Admitting: Physician Assistant

## 2016-02-14 ENCOUNTER — Ambulatory Visit (INDEPENDENT_AMBULATORY_CARE_PROVIDER_SITE_OTHER): Payer: Medicare Other | Admitting: Physician Assistant

## 2016-02-14 ENCOUNTER — Encounter: Payer: Self-pay | Admitting: Physician Assistant

## 2016-02-14 VITALS — BP 142/82 | HR 80 | Temp 98.3°F | Resp 18 | Wt 170.0 lb

## 2016-02-14 DIAGNOSIS — Z780 Asymptomatic menopausal state: Secondary | ICD-10-CM

## 2016-02-14 DIAGNOSIS — E2839 Other primary ovarian failure: Secondary | ICD-10-CM

## 2016-02-14 DIAGNOSIS — R739 Hyperglycemia, unspecified: Secondary | ICD-10-CM

## 2016-02-14 DIAGNOSIS — I10 Essential (primary) hypertension: Secondary | ICD-10-CM | POA: Diagnosis not present

## 2016-02-14 DIAGNOSIS — F410 Panic disorder [episodic paroxysmal anxiety] without agoraphobia: Secondary | ICD-10-CM | POA: Diagnosis not present

## 2016-02-14 DIAGNOSIS — E559 Vitamin D deficiency, unspecified: Secondary | ICD-10-CM | POA: Diagnosis not present

## 2016-02-14 DIAGNOSIS — F329 Major depressive disorder, single episode, unspecified: Secondary | ICD-10-CM | POA: Diagnosis not present

## 2016-02-14 DIAGNOSIS — N183 Chronic kidney disease, stage 3 unspecified: Secondary | ICD-10-CM

## 2016-02-14 DIAGNOSIS — Z860100 Personal history of colon polyps, unspecified: Secondary | ICD-10-CM

## 2016-02-14 DIAGNOSIS — Z1239 Encounter for other screening for malignant neoplasm of breast: Secondary | ICD-10-CM

## 2016-02-14 DIAGNOSIS — F32A Depression, unspecified: Secondary | ICD-10-CM

## 2016-02-14 DIAGNOSIS — I6523 Occlusion and stenosis of bilateral carotid arteries: Secondary | ICD-10-CM | POA: Diagnosis not present

## 2016-02-14 DIAGNOSIS — G459 Transient cerebral ischemic attack, unspecified: Secondary | ICD-10-CM

## 2016-02-14 DIAGNOSIS — E785 Hyperlipidemia, unspecified: Secondary | ICD-10-CM | POA: Diagnosis not present

## 2016-02-14 DIAGNOSIS — M8580 Other specified disorders of bone density and structure, unspecified site: Secondary | ICD-10-CM | POA: Diagnosis not present

## 2016-02-14 DIAGNOSIS — I6529 Occlusion and stenosis of unspecified carotid artery: Secondary | ICD-10-CM | POA: Insufficient documentation

## 2016-02-14 NOTE — Progress Notes (Signed)
Patient ID: IESHIA KASEY MRN: [id number], DOB: Jun 16, 1946, 70 y.o. Date of Encounter: @DATE @  Chief Complaint:  Chief Complaint  Patient presents with  . check up for med refills    HPI: 70 y.o. year old white female  presents for followup office visit. She had her last 3 months office visit 06/05/15. She then had a two-week follow-up visit to follow-up hypertension which was uncontrolled at that last visit.  Today she returns that she has recently been having swelling in her feet. Says that it was to where she would have some swelling but then it would go down. Says that over the past week the swelling has not been going down. No increased shortness of breath. No increased dyspnea on exertion. No orthopnea. No cough or chest congestion.   Depression: She was seen here on 05/27/13 for a routine office visit. At that [place], we performed a depression screen. This revealed that she was having problems with depression but she just had not dealt with at this she was directly asked these questions she became tearful. She stated that for several years she has been battling with this. She has no children. Her husband passed away 8 years ago. She retired 2 years ago. However she then returned back to her job part time. 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 She stated that she just found no motivation no interest in things that she used to be involved with. He regarding medical issues she had the same problem. For example at prior office visit I discussed carbohydrate handouts with her. However she ended up going home and making no changes. We also discussed scheduling mammogram and bone density test because they were overdue. However she's reported that she had not gone and scheduled these. She said that this was just like everything else and she just did not have the motivation or interest to get this done. Says that this is not the way she used to be. Reportedly she used to be very active and involved in  things. Just over the last 2 years she has not been interested or motivated. Says that she was raised to just deal with these things. Says that she probably been having some of these issues with depression ever since her husband passed away but has never dealt with them. Then when she tried to retire she really had difficulty.  At that visit I started Zoloft 50 mg. At f/u OV, she said that she was feeling wonderful. Said that she felt just like she was back to her old self. Said that she had been cleaning and scrubbing out of the house and has lots of energy . Said her coworkers have even commented that she seems much happier. She said that she is so thankful for this medication and that she has gotten this treated.!!  Today she states that she is still doing very well with the Zoloft. Again says "I'm a different person with that medicine". Current dose is working well and causing no adverse effects.  H/O Vasovagal Syncope; I reviewed her chart and see that she had office visit with me 10/24/14 regarding syncope. At that that visit I did an EKG as well as lab work. Labs showed that BUN/creatinine were slightly elevated and she was told to stop HCTZ. CBC and TSH were normal and A1c stable at 6.0. She subsequently saw cardiology for evaluation and I have reviewed their note from 10/26/14. At that time they felt that her syncope was most likely  vasovagal.  They did obtain 2-D echocardiogram and carotid Dopplers. The echo and carotid Dopplers were both performed 11/03/14 Carotid Dopplers showed mild stenosis repeat one year. Echocardiogram was normal except slight abnormality with relaxation.  Hyperlipidemia: She is taking the Crestor as directed. No myalgias or out their adverse effects.  Hypertension: At her office visit with me 06/05/15 she reported that she was taking both of her blood pressure medications daily---  Norvasc 5 mg and Lisinopril 20mg .  However at that visit we got her blood pressure  elevated.  I rechecked blood pressure myself at that visit I got 170/80 on the left and 180/90 on the right. At that visit we increased Norvasc to 10 mg and increase lisinopril to 40 mg daily. At OV 06/29/15--meds were continued same with no changes.. She was having no LE edema at that time.  See HPI from [place] 07/19/2015 regarding swelling in the feet. At that visit 07/19/2015 Norvasc was decreased to 5 mg and Bystolic 5 mg daily was added. At [facility] 02/14/2016---she is taking meds as directed. Does have to pay [id number] for Bystolic-----6 bottles (6 weeks worth) of samples given at OV   Past Medical History  Diagnosis Date  . Hypertension   . Anxiety   . History of colon polyps   . H/O hematuria   . Hyperlipidemia      Home Meds:  Outpatient Prescriptions Prior to Visit  Medication Sig Dispense Refill  . amLODipine (NORVASC) 5 MG tablet TAKE 1 TABLET(5 MG) BY MOUTH DAILY 30 tablet 0  . aspirin EC 81 MG tablet Take 81 mg by mouth daily.    Marland Kitchen BYSTOLIC 5 MG tablet TAKE 1 TABLET(5 MG) BY MOUTH DAILY 30 tablet 0  . Calcium Carbonate-Vit D-Min (CALCIUM 1200 PO) Take by mouth. In divided doses    . cetirizine (ZYRTEC) 10 MG tablet Take 1 tablet (10 mg total) by mouth daily. 30 tablet 11  . Cholecalciferol (SM VITAMIN D3) 4000 UNITS CAPS Take 1 capsule (4,000 Units total) by mouth daily. 30 capsule 11  . Coenzyme Q10 10 MG capsule Take 10 mg by mouth daily.    . cyanocobalamin 1000 MCG tablet Take 100 mcg by mouth daily.    Marland Kitchen lisinopril (PRINIVIL,ZESTRIL) 40 MG tablet Take 1 tablet (40 mg total) by mouth daily. 90 tablet 1  . omeprazole (PRILOSEC) 20 MG capsule TAKE 1 CAPSULE BY MOUTH EVERY DAY 30 capsule 0  . rosuvastatin (CRESTOR) 20 MG tablet TAKE 1 TABLET BY MOUTH EVERY DAY 30 tablet 0  . sertraline (ZOLOFT) 50 MG tablet TAKE 1 TABLET BY MOUTH EVERY DAY 90 tablet 0  . azithromycin (ZITHROMAX) 250 MG tablet Day 1: Take 2 daily. Days 2-5: Take 1 daily. 6 tablet 0   No facility-administered  medications prior to visit.     Allergies:  Allergies  Allergen Reactions  . Norvasc [Amlodipine Besylate] Swelling    10mg  dose causes swelling (07/19/2015).  Tolerates 5mg  with no swelling.    Social History   Social History  . Marital Status: Married    Spouse Name: N/A  . Number of Children: 0  . Years of Education: N/A   Occupational History  . 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 Smoking status: Former Smoker -- 1.00 packs/day for 30 years    Types: Cigarettes    Quit date: 10/27/2003  . Smokeless tobacco: Never Used  . Alcohol Use: 0.0 oz/week    0 Standard drinks or equivalent  per week     Comment: socially  . Drug Use: No  . Sexual Activity: Not on file   Other Topics Concern  . Not on file   Social History Narrative   Widowed. No children.   Retired.    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   This is where she worked prior to "retirement"     Family History  Problem Relation Age of Onset  . Colon cancer Sister      Review of Systems:  See HPI for pertinent ROS. All other ROS negative.    Physical Exam: Blood pressure 142/82, pulse 80, temperature 98.3 F (36.8 C), temperature source Oral, resp. rate 18, weight 170 lb (77.111 kg)., Body mass index is 29.17 kg/(m^2). General: WNWD WF. Appears in no acute distress. Neck: Supple. No thyromegaly. No lymphadenopathy. No carotid bruits on exam today. Lungs: Clear bilaterally to auscultation without wheezes, rales, or rhonchi. Breathing is unlabored. Heart: RRR with S1 S2. No murmurs, rubs, or gallops. Musculoskeletal:  Strength and tone normal for age. Extremities/Skin: Warm and dry. No LE edema today.  Neuro: Alert and oriented X 3. Moves all extremities spontaneously. Gait is normal. CNII-XII grossly in tact. Psych:  Responds to questions appropriately with a normal affect.     ASSESSMENT AND PLAN:  70 y.o. year old female with   1. Essential  hypertension  07/19/2015--- bilateral lower extremity edema--- Norvasc decreased from 10 mg down to 5 mg. Bystolic 5 mg daily added to keep blood pressure controlled.  10/24/14--labs showed elevated BUN/creatinine and HCTZ was stopped. Avoid HCTZ in future.  BP stable/controlled. Cont current meds. Check labs to monitor.  6 weeks worth of Bystolic samples given---She pays $58 per Rx   2. Hyperlipidemia On Crestor. Last FLP LFT 06/05/15. Recheck LFT now  Hyperglycemia This has been controlled with diet changes and low carbohydrate diet. Last A1C 06/05/2015 Recheck A1C to monitor  Chronic Kidney Disease --GFR ~ 48  H/O Transient cerebral ischemia, unspecified transient cerebral ischemia type This is stable. She has had no recurrent TIA symptoms.  H/O Vasovagal syncope 10/2014 --Status post cardiology evaluation/office visit 10/26/14. --Status post carotid artery Dopplers 11/03/14. Mild disease. Repeat one year. --Status post echocardiogram 11/03/14. Normal systolic function. Normal valves. Minimal relaxation abnormality.  Carotid Artery Stenosis: She had Carotid Dopplers performed as part of syncope eval 2016 Carotid Dopplers 11/2014 showed  ---Stable  1 -39% RICA ---Progression of LICA to 123456 (low range) Dr. Camillia Herter note said to repeat 1 year.  At [facility] 02/2016--discussed with pt--she has not had f/u carotid dopplers. Can waith one more year to repeat.  WILL REPEAT 2018   Panic attacks This is controlled with the Zoloft. Continue Zoloft at current dose.  Depression This is well controlled with Zoloft 50 mg. Continue current treatment.  Vitamin D deficiency She is on vitamin D supplement. Recheck vitamin D level 06/05/15. At that time told her to increase supplement from 2000 units daily to 4000 units daily. At follow-up visit 06/29/15 she reports that she has increased this and is taking the 4,000 units daily.  Osteopenia / Postmenopausal / Estrogen deficiency - DG Bone  Density; Future Last bone density scan was performed 08/11/13. Worst T score was -2.1. I went ahead and ordered bone density scan today but made a note for to be after December 10. ADDENDUM--ADDED 08/17/2015; Worst T score -2.0. Reviewed that on her prior DEXA 08/11/13 T-scores were -2.1 and -0.9. Therefore current findings are stable. She  is already taking calcium 1200 mg per day and vitamin D 4000 units daily. To continue these. Reviewed her other risk factors for developing osteoporosis. She quit smoking in 2005. She has no personal h/o fractures.  No family h/o fractures or osteoporosis.  For now, she will continue the calcium and vitamin D.  Breast cancer screening - MM Digital Screening; Future I went ahead and ordered this today but made a note for to be performed after December 10 at the same date as the bone density scan.  History of colon polyps:  Colonoscopy 03/24/12 revealed polyp. Repeat 5 years. Discussed at [facility] 02/2016----last colonoscpoy was performed at [facility]--she is to call there to verify when next colonoscopy due.  Immunizations: Influenza vaccine------------------- given here 06/05/2015 T dap-------------------------------------- given here 12/27/11 Pneumonia vaccine--------------- she received Pneumovax 23 12/27/11 ------------------------------------------------- give Prevnar 13 today----06/05/15 Zostavax----------------------------- she states that she did recently find out that her insurance does cover this and she does want to receive this.      Today (06/05/2015) we have informed her of amount of time she needs to wait between each vaccine. Then she will go to the pharmacy and get the Zostavax. 02/2016 Ov--she says she forgot about this. Wrote it on her AVS today as a reminder.   Follow-up office visit 6 months, sooner if needed.   Marin Olp [place], [place], [facility] 02/14/2016 4:12 PM

## 2016-02-15 DIAGNOSIS — N189 Chronic kidney disease, unspecified: Secondary | ICD-10-CM | POA: Insufficient documentation

## 2016-02-15 DIAGNOSIS — N183 Chronic kidney disease, stage 3 unspecified: Secondary | ICD-10-CM | POA: Insufficient documentation

## 2016-02-15 LAB — COMPLETE METABOLIC PANEL WITH GFR
ALBUMIN: 4.3 g/dL (ref 3.6–5.1)
ALK PHOS: 94 U/L (ref 33–130)
ALT: 10 U/L (ref 6–29)
AST: 14 U/L (ref 10–35)
BUN: 22 mg/dL (ref 7–25)
CALCIUM: 9.7 mg/dL (ref 8.6–10.4)
CHLORIDE: 102 mmol/L (ref 98–110)
CO2: 23 mmol/L (ref 20–31)
Creat: 1.09 mg/dL — ABNORMAL HIGH (ref 0.60–0.93)
GFR, EST NON AFRICAN AMERICAN: 51 mL/min — AB (ref >=60)
GFR, Est African American: 59 mL/min — ABNORMAL LOW (ref >=60)
GLUCOSE: 83 mg/dL (ref 70–99)
POTASSIUM: 4.6 mmol/L (ref 3.5–5.3)
SODIUM: 142 mmol/L (ref 135–146)
Total Bilirubin: 0.3 mg/dL (ref 0.2–1.2)
Total Protein: 7.2 g/dL (ref 6.1–8.1)

## 2016-02-15 LAB — HEMOGLOBIN A1C
HEMOGLOBIN A1C: 6 % — AB (ref <5.7)
Mean Plasma Glucose: 126 mg/dL

## 2016-02-20 ENCOUNTER — Encounter: Payer: Self-pay | Admitting: Family Medicine

## 2016-03-27 ENCOUNTER — Other Ambulatory Visit: Payer: Self-pay | Admitting: Physician Assistant

## 2016-03-27 DIAGNOSIS — R600 Localized edema: Secondary | ICD-10-CM

## 2016-03-27 DIAGNOSIS — I10 Essential (primary) hypertension: Secondary | ICD-10-CM

## 2016-03-27 NOTE — Telephone Encounter (Signed)
Refill appropriate and filled per protocol. 

## 2016-04-13 ENCOUNTER — Other Ambulatory Visit: Payer: Self-pay | Admitting: Physician Assistant

## 2016-04-15 NOTE — Telephone Encounter (Signed)
Medication refilled per protocol. 

## 2016-04-22 ENCOUNTER — Other Ambulatory Visit: Payer: Self-pay | Admitting: Physician Assistant

## 2016-05-28 ENCOUNTER — Other Ambulatory Visit: Payer: Self-pay | Admitting: Physician Assistant

## 2016-07-22 ENCOUNTER — Other Ambulatory Visit: Payer: Self-pay | Admitting: Physician Assistant

## 2016-07-22 DIAGNOSIS — I10 Essential (primary) hypertension: Secondary | ICD-10-CM

## 2016-07-22 DIAGNOSIS — R059 Cough, unspecified: Secondary | ICD-10-CM

## 2016-07-22 DIAGNOSIS — R05 Cough: Secondary | ICD-10-CM

## 2016-07-22 NOTE — Telephone Encounter (Signed)
Rx filled per protocol  

## 2016-07-29 ENCOUNTER — Ambulatory Visit (INDEPENDENT_AMBULATORY_CARE_PROVIDER_SITE_OTHER): Payer: Medicare Other | Admitting: Physician Assistant

## 2016-07-29 ENCOUNTER — Encounter: Payer: Self-pay | Admitting: Physician Assistant

## 2016-07-29 VITALS — BP 160/98 | HR 85 | Temp 98.7°F | Resp 20 | Wt 161.0 lb

## 2016-07-29 DIAGNOSIS — G43A1 Cyclical vomiting, intractable: Secondary | ICD-10-CM

## 2016-07-29 DIAGNOSIS — R1115 Cyclical vomiting syndrome unrelated to migraine: Secondary | ICD-10-CM

## 2016-07-29 LAB — COMPLETE METABOLIC PANEL WITHOUT GFR
ALT: 10 U/L (ref 6–29)
AST: 12 U/L (ref 10–35)
Albumin: 4.4 g/dL (ref 3.6–5.1)
Alkaline Phosphatase: 94 U/L (ref 33–130)
BUN: 19 mg/dL (ref 7–25)
CO2: 23 mmol/L (ref 20–31)
Calcium: 9.6 mg/dL (ref 8.6–10.4)
Chloride: 104 mmol/L (ref 98–110)
Creat: 1.04 mg/dL — ABNORMAL HIGH (ref 0.60–0.93)
GFR, Est African American: 62 mL/min
GFR, Est Non African American: 54 mL/min — ABNORMAL LOW
Glucose, Bld: 137 mg/dL — ABNORMAL HIGH (ref 70–99)
Potassium: 3.7 mmol/L (ref 3.5–5.3)
Sodium: 141 mmol/L (ref 135–146)
Total Bilirubin: 0.4 mg/dL (ref 0.2–1.2)
Total Protein: 7.4 g/dL (ref 6.1–8.1)

## 2016-07-29 LAB — URINALYSIS, ROUTINE W REFLEX MICROSCOPIC
BILIRUBIN URINE: NEGATIVE
GLUCOSE, UA: NEGATIVE
Leukocytes, UA: NEGATIVE
Nitrite: NEGATIVE
PH: 6 (ref 5.0–8.0)

## 2016-07-29 LAB — CBC
HCT: 41.9 % (ref 35.0–45.0)
Hemoglobin: 13.9 g/dL (ref 12.0–15.0)
MCH: 29.1 pg (ref 27.0–33.0)
MCHC: 33.2 g/dL (ref 32.0–36.0)
MCV: 87.8 fL (ref 80.0–100.0)
Platelets: 417 10*3/uL — ABNORMAL HIGH (ref 140–400)
RBC: 4.77 MIL/uL (ref 3.80–5.10)
RDW: 16.5 % — ABNORMAL HIGH (ref 11.0–15.0)
WBC: 17.1 10*3/uL — ABNORMAL HIGH (ref 3.8–10.8)

## 2016-07-29 LAB — URINALYSIS, MICROSCOPIC ONLY
Crystals: NONE SEEN [HPF]
WBC, UA: NONE SEEN WBC/HPF (ref <=5)
YEAST: NONE SEEN [HPF]

## 2016-07-29 MED ORDER — PROMETHAZINE HCL 25 MG/ML IJ SOLN
25.0000 mg | Freq: Once | INTRAMUSCULAR | Status: AC
  Administered 2016-07-29: 25 mg via INTRAVENOUS

## 2016-07-29 MED ORDER — PROMETHAZINE HCL 25 MG RE SUPP: 25.0000 mg | Freq: Four times a day (QID) | RECTAL | 0 refills | Status: DC | PRN

## 2016-07-29 NOTE — Progress Notes (Signed)
Patient ID: Sherry Kent MRN: [id number], DOB: [birth year], 70 y.o. Date of Encounter: @DATE @  Chief Complaint:  Chief Complaint  Patient presents with  . Nausea    x 3 days   . not able to hold fluid down    HPI: 70 y.o. year old female  presents with above.   Says that this started early Saturday morning which was 07/27/16. Says that morning she woke at about 4 or 5 AM. She felt thirsty so she got a sip of water but immediately vomited. Says it has continued to be that way since then. Every time she tries a sip of anything she vomits. Says that she tried to just get down a sip of Coke thinking that would settle her stomach but just threw it right back up. Says even just an ice cube did the same thing. She has had no diarrhea--- well then she adds "except this morning just a little bit ". Having significant nausea. She lives alone. No known sick contacts with similar symptoms. Has had no focal/localized area of abdominal pain. The whole abdomen "rolls and gurgles" has had no known fever or chills. Temperature here 98.7. Says that she hasn't been able to take any of her usual medicines because she vomits everything. A friend drove her here for her appointment. She says that she hasn't actually felt like she was going to pass out but says that when she was in the bathroom she was having to hold to the tub etc. because she feels so weak. Has had no other symptoms other than this nausea and vomiting and weakness. No other concerns or symptoms or signs.   Past Medical History:  Diagnosis Date  . Anxiety   . H/O hematuria   . History of colon polyps   . Hyperlipidemia   . Hypertension      Home Meds: Outpatient Medications Prior to Visit  Medication Sig Dispense Refill  . amLODipine (NORVASC) 5 MG tablet TAKE 1 TABLET(5 MG) BY MOUTH DAILY 30 tablet 0  . aspirin EC 81 MG tablet Take 81 mg by mouth daily.    Marland Kitchen BYSTOLIC 5 MG tablet TAKE 1 TABLET BY MOUTH EVERY DAY 30 tablet 0  . Calcium  Carbonate-Vit D-Min (CALCIUM 1200 PO) Take by mouth. In divided doses    . cetirizine (ZYRTEC) 10 MG tablet TAKE 1 TABLET BY MOUTH EVERY DAY 30 tablet 0  . Cholecalciferol (SM VITAMIN D3) 4000 UNITS CAPS Take 1 capsule (4,000 Units total) by mouth daily. 30 capsule 11  . Coenzyme Q10 10 MG capsule Take 10 mg by mouth daily.    . cyanocobalamin 1000 MCG tablet Take 100 mcg by mouth daily.    Marland Kitchen lisinopril (PRINIVIL,ZESTRIL) 40 MG tablet TAKE 1 TABLET(40 MG) BY MOUTH DAILY 30 tablet 0  . omeprazole (PRILOSEC) 20 MG capsule TAKE 1 CAPSULE BY MOUTH EVERY DAY 30 capsule 5  . rosuvastatin (CRESTOR) 20 MG tablet TAKE 1 TABLET BY MOUTH EVERY DAY 90 tablet 1  . sertraline (ZOLOFT) 50 MG tablet TAKE 1 TABLET BY MOUTH EVERY DAY 90 tablet 0   No facility-administered medications prior to visit.     Allergies:  Allergies  Allergen Reactions  . Norvasc [Amlodipine Besylate] Swelling    10mg  dose causes swelling (07/19/2015).  Tolerates 5mg  with no swelling.    Social History   Social History  . Marital status: Married    Spouse name: N/A  . Number of children: 0  . Years  of education: N/A   Occupational History  . 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 Smoking status: Former Smoker    Packs/day: 1.00    Years: 30.00    Types: Cigarettes    Quit date: 10/27/2003  . Smokeless tobacco: Never Used  . Alcohol use 0.0 oz/week     Comment: socially  . Drug use: No  . Sexual activity: Not on file   Other Topics Concern  . Not on file   Social History Narrative   Widowed. No children.   Retired.    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   This is where she worked prior to "retirement"     Family History  Problem Relation Age of Onset  . Colon cancer Sister      Review of Systems:  See HPI for pertinent ROS. All other ROS negative.    Physical Exam: Blood pressure (!) 160/98, pulse 85, temperature 98.7 F (37.1 C), temperature source Oral, resp.  rate 20, weight 161 lb (73 kg), SpO2 96 %., Body mass index is 27.64 kg/m. General: WNWD WF--Appears in no acute distress--but does look ill---does not look like she usually does.  Neck: Supple. No thyromegaly. No lymphadenopathy. Lungs: Clear bilaterally to auscultation without wheezes, rales, or rhonchi. Breathing is unlabored. Heart: RRR with S1 S2. No murmurs, rubs, or gallops. Abdomen: Soft, non-tender, non-distended with normoactive bowel sounds. No hepatomegaly. No rebound/guarding. No obvious abdominal masses. No area of focal/localized tenderness. Diffuse "gurgling" sensaton throughout entire abdomen. Musculoskeletal:  Strength and tone normal for age. Extremities/Skin: Warm and dry. Neuro: Alert and oriented X 3. Moves all extremities spontaneously. Gait is normal. CNII-XII grossly in tact. Psych:  Responds to questions appropriately with a normal affect.      ASSESSMENT AND PLAN:  70 y.o. year old female with  1. Intractable cyclical vomiting with nausea Check CBC here and get this result immediately. Will start IV fluids and give her Phenergan 25 mg IV. Will then prescribe some Phenergan for her to use at home as well. Her friend is here to drive her and can transport her home. Most likely this is a viral gastritis. This should hopefully be resolving on its own in the very near future over the next 24-48 hours.  - COMPLETE METABOLIC PANEL WITH GFR - CBC  Results for orders placed or performed in visit on 07/29/16  CBC  Result Value Ref Range   WBC 17.1 (H) 3.8 - 10.8 K/uL   RBC 4.77 3.80 - 5.10 MIL/uL   Hemoglobin 13.9 12.0 - 15.0 g/dL   HCT 41.9 35.0 - 45.0 %   MCV 87.8 80.0 - 100.0 fL   MCH 29.1 27.0 - 33.0 pg   MCHC 33.2 32.0 - 36.0 g/dL   RDW 16.5 (H) 11.0 - 15.0 %   Platelets 417 (H) 140 - 400 K/uL   ADDENDUM--ADDED LATER ON 07/29/2016 at 1:50pm:  Reviewed that this lab result is back and white count elevated, I reviewed results with Dr. Buelah Manis. I reviewed  patient's case with her. She later talked with the patient again and reexamined her.Agreed that she has normal bowel sounds and normal abdominal exam. Patient is currently receiving second bag of IV fluids and is looking better and says that she is feeling a lot better.Also has received Phenergan 25mg  IV.  Will obtain a urinalysis. Will prescribe some Phenergan suppositories for her to use. Will have her follow-up here in 48 hours. I  am off that day but Dr. Buelah Manis is here so will schedule her to follow-up with Dr. Buelah Manis on that date. Discussed indications for her to go to the emergency room in the interim including any new type of pain or worsening of symptoms. Patient voices understanding and agrees.   [address] [place], [place], [facility] 07/29/2016 12:53 PM

## 2016-07-31 ENCOUNTER — Ambulatory Visit (INDEPENDENT_AMBULATORY_CARE_PROVIDER_SITE_OTHER): Payer: Medicare Other | Admitting: Family Medicine

## 2016-07-31 ENCOUNTER — Encounter: Payer: Self-pay | Admitting: Family Medicine

## 2016-07-31 VITALS — BP 150/80 | HR 80 | Temp 98.2°F | Resp 16 | Wt 166.0 lb

## 2016-07-31 DIAGNOSIS — J069 Acute upper respiratory infection, unspecified: Secondary | ICD-10-CM | POA: Diagnosis not present

## 2016-07-31 DIAGNOSIS — I10 Essential (primary) hypertension: Secondary | ICD-10-CM | POA: Diagnosis not present

## 2016-07-31 DIAGNOSIS — D72829 Elevated white blood cell count, unspecified: Secondary | ICD-10-CM | POA: Diagnosis not present

## 2016-07-31 LAB — CBC
HEMATOCRIT: 38.2 % (ref 35.0–45.0)
HEMOGLOBIN: 12.5 g/dL (ref 12.0–15.0)
MCH: 28.9 pg (ref 27.0–33.0)
MCHC: 32.7 g/dL (ref 32.0–36.0)
MCV: 88.2 fL (ref 80.0–100.0)
Platelets: 330 10*3/uL (ref 140–400)
RBC: 4.33 MIL/uL (ref 3.80–5.10)
RDW: 16.5 % — ABNORMAL HIGH (ref 11.0–15.0)
WBC: 8 10*3/uL (ref 3.8–10.8)

## 2016-07-31 LAB — URINE CULTURE

## 2016-07-31 MED ORDER — LISINOPRIL 40 MG PO TABS: ORAL_TABLET | ORAL | 6 refills | Status: DC

## 2016-07-31 MED ORDER — AZITHROMYCIN 250 MG PO TABS: ORAL_TABLET | ORAL | 0 refills | Status: DC

## 2016-07-31 MED ORDER — NEBIVOLOL HCL 5 MG PO TABS: 5.0000 mg | ORAL_TABLET | Freq: Every day | ORAL | 6 refills | Status: DC

## 2016-07-31 NOTE — Assessment & Plan Note (Signed)
Blood pressure is quite elevated to on re check it came down to 150/ 86. Have her monitor her blood pressure at home we will follow back in the office in a few weeks once her illnesses settle down. She also now has concurrent upper respiratory infection this may have started some of the nausea vomiting and the white blood cell count as well. Her repeat white blood cell count today in the office is down to 8 which is great. I'm been a treat her with Z-Pak she can use over-the-counter cough medicine

## 2016-07-31 NOTE — Progress Notes (Signed)
   Subjective:    Patient ID: Sherry Kent, female    DOB: Apr 28, 1946, 70 y.o.   MRN: [id number]  Patient presents for Follow-up  Pt here to f/u visit from 2 days ago, seen with abdominal pain, N/V had white count of 17., But no pain,no fever, no respiratory symptoms. Her exam was fairly benign except dehydration, I did examine hermyself on 11/27, NABS,soft,NT, lungs CTAB She received 2 L of fluid in the office  Urine culture was negative , CMET unremarkable, Cr was at baseline 1.04, BUN 19 Today she states that she feels much better. She was able to E a small amount yesterday she has not had any further nausea vomiting or diarrhea. She has however had cough with congestion she had some mild cough when she came in but did not think it was of importance. Now it is very congested in her chest. She has not had any difficulty breathing she has had for a little wheezing  Hypertension- she did not take her blood pressure medicines for the past 3-4 days she did take them this morning  Review Of Systems:  GEN- denies fatigue, fever, weight loss,weakness, recent illness HEENT- denies eye drainage, change in vision, nasal discharge, CVS- denies chest pain, palpitations RESP- denies SOB,+ cough,+ wheeze ABD- denies N/V, change in stools, abd pain GU- denies dysuria, hematuria, dribbling, incontinence MSK- denies joint pain, muscle aches, injury Neuro- denies headache, dizziness, syncope, seizure activity       Objective:    BP (!) 150/80   Pulse 80   Temp 98.2 F (36.8 C) (Oral)   Resp 16   Wt 166 lb (75.3 kg)   BMI 28.49 kg/m  GEN- NAD, alert and oriented x3 HEENT- PERRL, EOMI, non injected sclera, pink conjunctiva, MMM, oropharynx clear Neck- Supple, no LAD  CVS- RRR, no murmur RESP-rhonchi bilat, scattered wheeze , normal WOB, no retractions  ABD-NABS,soft,NT,ND EXT- No edema Pulses- Radial 2+        Assessment & Plan:      Problem List Items Addressed This Visit    Hypertension    Blood pressure is quite elevated to on re check it came down to 150/ 86. Have her monitor her blood pressure at home we will follow back in the office in a few weeks once her illnesses settle down. She also now has concurrent upper respiratory infection this may have started some of the nausea vomiting and the white blood cell count as well. Her repeat white blood cell count today in the office is down to 8 which is great. I'm been a treat her with Z-Pak she can use over-the-counter cough medicine      Relevant Medications   nebivolol (BYSTOLIC) 5 MG tablet   lisinopril (PRINIVIL,ZESTRIL) 40 MG tablet    Other Visit Diagnoses    Leukocytosis, unspecified type    -  Primary   Relevant Orders   CBC (Completed)   Bilateral edema of lower extremity       Acute URI       Relevant Medications   azithromycin (ZITHROMAX) 250 MG tablet      Note: This dictation was prepared with Dragon dictation along with smaller phrase technology. Any transcriptional errors that result from this process are unintentional.

## 2016-07-31 NOTE — Patient Instructions (Addendum)
F/U 4 WEEKS for blood pressure

## 2016-08-02 ENCOUNTER — Other Ambulatory Visit: Payer: Self-pay

## 2016-08-13 ENCOUNTER — Ambulatory Visit: Payer: Self-pay | Admitting: Family Medicine

## 2016-08-28 ENCOUNTER — Ambulatory Visit: Payer: Medicare Other | Admitting: Family Medicine

## 2016-09-02 ENCOUNTER — Other Ambulatory Visit: Payer: Self-pay | Admitting: Physician Assistant

## 2016-09-02 DIAGNOSIS — R05 Cough: Secondary | ICD-10-CM

## 2016-09-02 DIAGNOSIS — R059 Cough, unspecified: Secondary | ICD-10-CM

## 2016-09-03 ENCOUNTER — Other Ambulatory Visit: Payer: Self-pay | Admitting: Physician Assistant

## 2016-09-03 DIAGNOSIS — I10 Essential (primary) hypertension: Secondary | ICD-10-CM

## 2016-10-22 ENCOUNTER — Other Ambulatory Visit: Payer: Self-pay | Admitting: Physician Assistant

## 2016-10-23 ENCOUNTER — Ambulatory Visit (INDEPENDENT_AMBULATORY_CARE_PROVIDER_SITE_OTHER): Payer: Medicare Other | Admitting: Family Medicine

## 2016-10-23 ENCOUNTER — Encounter: Payer: Self-pay | Admitting: Family Medicine

## 2016-10-23 ENCOUNTER — Other Ambulatory Visit: Payer: Self-pay | Admitting: Family Medicine

## 2016-10-23 VITALS — BP 148/72 | HR 76 | Temp 98.8°F | Resp 14 | Ht 66.0 in | Wt 168.0 lb

## 2016-10-23 DIAGNOSIS — E7800 Pure hypercholesterolemia, unspecified: Secondary | ICD-10-CM

## 2016-10-23 DIAGNOSIS — F325 Major depressive disorder, single episode, in full remission: Secondary | ICD-10-CM | POA: Diagnosis not present

## 2016-10-23 DIAGNOSIS — Z23 Encounter for immunization: Secondary | ICD-10-CM

## 2016-10-23 DIAGNOSIS — R7302 Impaired glucose tolerance (oral): Secondary | ICD-10-CM

## 2016-10-23 DIAGNOSIS — I10 Essential (primary) hypertension: Secondary | ICD-10-CM

## 2016-10-23 MED ORDER — SERTRALINE HCL 50 MG PO TABS: 50.0000 mg | ORAL_TABLET | Freq: Every day | ORAL | 2 refills | Status: DC

## 2016-10-23 MED ORDER — NEBIVOLOL HCL 5 MG PO TABS: 5.0000 mg | ORAL_TABLET | Freq: Every day | ORAL | 2 refills | Status: DC

## 2016-10-23 MED ORDER — ROSUVASTATIN CALCIUM 20 MG PO TABS: 20.0000 mg | ORAL_TABLET | Freq: Every day | ORAL | 2 refills | Status: DC

## 2016-10-23 MED ORDER — OMEPRAZOLE 20 MG PO CPDR: DELAYED_RELEASE_CAPSULE | ORAL | 2 refills | Status: DC

## 2016-10-23 MED ORDER — LISINOPRIL 40 MG PO TABS: ORAL_TABLET | ORAL | 2 refills | Status: DC

## 2016-10-23 NOTE — Addendum Note (Signed)
Addended by: Sheral Flow on: 10/23/2016 03:28 PM   Modules accepted: Orders

## 2016-10-23 NOTE — Assessment & Plan Note (Signed)
Controlled for age, she monitors at home as well

## 2016-10-23 NOTE — Progress Notes (Signed)
   Subjective:    Patient ID: Sherry Kent, female    DOB: [birth year], 71 y.o.   MRN: [id number]  Patient presents for Medication Review/ Refills and Mole (hs mole on back near bra line that patient states is scaly to touc) Patient here to review medications. She has history of hypertension she is currently on bystolic and lisinopril. Feels well, checks BP at home, less than [id number] systolic   Hyperlipidemia she is currently on Crestor 20 mg does have history of TIA. Last lipid panel in October 2016 LDL was 105  Depression she is on Zoloft- has been on > 1 year, feels good with the medication therefore wants to continue.   History of glucose intolerance her A1c was last 6% 8 months ago  Mole on left braline, little scaley , she can't see it, non tender  Allergies- takes zyrtec daily   Review Of Systems:  GEN- denies fatigue, fever, weight loss,weakness, recent illness HEENT- denies eye drainage, change in vision, nasal discharge, CVS- denies chest pain, palpitations RESP- denies SOB, cough, wheeze ABD- denies N/V, change in stools, abd pain GU- denies dysuria, hematuria, dribbling, incontinence MSK- denies joint pain, muscle aches, injury Neuro- denies headache, dizziness, syncope, seizure activity       Objective:    BP (!) 148/72   Pulse 76   Temp 98.8 F (37.1 C) (Oral)   Resp 14   Ht 5\' 6"  (1.676 m)   Wt 168 lb (76.2 kg)   SpO2 96%   BMI 27.12 kg/m  GEN- NAD, alert and oriented x3 HEENT- PERRL, EOMI, non injected sclera, pink conjunctiva, MMM, oropharynx clear Neck- Supple, no thyromegaly CVS- RRR, no murmur RESP-CTAB Psych- normal affect and mood  EXT- No edema Pulses- Radial, DP- 2+        Assessment & Plan:      Problem List Items Addressed This Visit    Hypertension    Controlled for age, she monitors at home as well       Relevant Medications   nebivolol (BYSTOLIC) 5 MG tablet   lisinopril (PRINIVIL,ZESTRIL) 40 MG tablet   rosuvastatin (CRESTOR)  20 MG tablet   Hyperlipidemia - Primary   Relevant Medications   nebivolol (BYSTOLIC) 5 MG tablet   lisinopril (PRINIVIL,ZESTRIL) 40 MG tablet   rosuvastatin (CRESTOR) 20 MG tablet   Glucose intolerance (impaired glucose tolerance)    Recheck A1C with fasting labs       Depression    CONTINUE ZOLOFT, depression is well treated with current dose       Relevant Medications   sertraline (ZOLOFT) 50 MG tablet      Note: This dictation was prepared with Dragon dictation along with smaller phrase technology. Any transcriptional errors that result from this process are unintentional.

## 2016-10-23 NOTE — Patient Instructions (Signed)
F/U 6 month Physical

## 2016-10-23 NOTE — Telephone Encounter (Signed)
Medication refilled per protocol. 

## 2016-10-23 NOTE — Assessment & Plan Note (Signed)
CONTINUE ZOLOFT, depression is well treated with current dose

## 2016-10-23 NOTE — Assessment & Plan Note (Signed)
Recheck A1C with fasting labs

## 2016-11-23 ENCOUNTER — Other Ambulatory Visit: Payer: Self-pay | Admitting: Family Medicine

## 2016-11-23 DIAGNOSIS — I10 Essential (primary) hypertension: Secondary | ICD-10-CM

## 2017-01-04 ENCOUNTER — Other Ambulatory Visit: Payer: Self-pay | Admitting: Family Medicine

## 2017-01-04 DIAGNOSIS — I10 Essential (primary) hypertension: Secondary | ICD-10-CM

## 2017-02-18 ENCOUNTER — Other Ambulatory Visit: Payer: Self-pay | Admitting: Family Medicine

## 2017-02-26 ENCOUNTER — Ambulatory Visit
Admission: RE | Admit: 2017-02-26 | Discharge: 2017-02-26 | Disposition: A | Discharge status: Home / Self Care | Payer: Medicare PPO | Source: Ambulatory Visit | Attending: Family Medicine | Admitting: Family Medicine

## 2017-02-26 ENCOUNTER — Encounter: Payer: Self-pay | Admitting: Family Medicine

## 2017-02-26 ENCOUNTER — Ambulatory Visit (INDEPENDENT_AMBULATORY_CARE_PROVIDER_SITE_OTHER): Payer: Medicare Other | Admitting: Family Medicine

## 2017-02-26 VITALS — BP 142/76 | HR 66 | Temp 98.3°F | Resp 14 | Ht 66.0 in | Wt 176.0 lb

## 2017-02-26 DIAGNOSIS — M79672 Pain in left foot: Secondary | ICD-10-CM

## 2017-02-26 MED ORDER — DICLOFENAC SODIUM 50 MG PO TBEC: 50.0000 mg | DELAYED_RELEASE_TABLET | Freq: Two times a day (BID) | ORAL | 0 refills | Status: DC

## 2017-02-26 NOTE — Patient Instructions (Addendum)
Get the xray of the heel Take diclofenac twice a day  [place] F/U as needed

## 2017-02-26 NOTE — Progress Notes (Signed)
   Subjective:    Patient ID: Sherry Kent, female    DOB: 01-19-1946, 71 y.o.   MRN: [id number]  Patient presents for L Heel Pain (x weeks- plantar fascitis )   Pt here with left heel pain for past 3 weeks, occasionally gets foot pain but not this painful, no swelling no injury, walked more than she usually does Saturday and pain was severe. Took ibuprofen   Review Of Systems:  GEN- denies fatigue, fever, weight loss,weakness, recent illness HEENT- denies eye drainage, change in vision, nasal discharge, CVS- denies chest pain, palpitations RESP- denies SOB, cough, wheeze ABD- denies N/V, change in stools, abd pain GU- denies dysuria, hematuria, dribbling, incontinence MSK- +joint pain, muscle aches, injury Neuro- denies headache, dizziness, syncope, seizure activity       Objective:    BP (!) 142/76   Pulse 66   Temp 98.3 F (36.8 C) (Oral)   Resp 14   Ht 5\' 6"  (1.676 m)   Wt 176 lb (79.8 kg)   SpO2 98%   BMI 28.41 kg/m  GEN- NAD, alert and oriented x3 Ext- no edema, bilat foot/ankle FROM, heel NT, left foot, swelling at base of Great toe, mild bunion, TTP at heel and minimal at plantar insertion, achiiles in tact bilat Pulse- DP, PT 2+ bilat        Assessment & Plan:      Problem List Items Addressed This Visit    None    Visit Diagnoses    Pain of left heel    -  Primary   Concern for heel spur, OA, possible plantar fascilitis as well. Give NSAIDS, ICE, obtain xray, Podiatry pending findings   Relevant Orders   DG Foot Complete Left (Completed)      Note: This dictation was prepared with Dragon dictation along with smaller phrase technology. Any transcriptional errors that result from this process are unintentional.

## 2017-02-27 ENCOUNTER — Encounter: Payer: Self-pay | Admitting: Family Medicine

## 2017-03-21 ENCOUNTER — Other Ambulatory Visit: Payer: Self-pay | Admitting: Family Medicine

## 2017-04-02 ENCOUNTER — Other Ambulatory Visit: Payer: Self-pay | Admitting: Family Medicine

## 2017-04-15 ENCOUNTER — Ambulatory Visit (INDEPENDENT_AMBULATORY_CARE_PROVIDER_SITE_OTHER): Payer: Medicare Other | Admitting: Family Medicine

## 2017-04-15 ENCOUNTER — Encounter: Payer: Self-pay | Admitting: Family Medicine

## 2017-04-15 VITALS — BP 138/78 | HR 82 | Temp 98.8°F | Resp 16 | Ht 66.0 in | Wt 169.0 lb

## 2017-04-15 DIAGNOSIS — J0100 Acute maxillary sinusitis, unspecified: Secondary | ICD-10-CM

## 2017-04-15 MED ORDER — AMOXICILLIN 875 MG PO TABS: 875.0000 mg | ORAL_TABLET | Freq: Two times a day (BID) | ORAL | 0 refills | Status: DC

## 2017-04-15 MED ORDER — METHYLPREDNISOLONE ACETATE 40 MG/ML IJ SUSP
40.0000 mg | Freq: Once | INTRAMUSCULAR | Status: AC
  Administered 2017-04-15: 40 mg via INTRAMUSCULAR

## 2017-04-15 MED ORDER — FLUTICASONE PROPIONATE 50 MCG/ACT NA SUSP: 2.0000 | Freq: Every day | NASAL | 0 refills | Status: DC

## 2017-04-15 NOTE — Addendum Note (Signed)
Addended by: Sheral Flow on: 04/15/2017 04:18 PM   Modules accepted: Orders

## 2017-04-15 NOTE — Patient Instructions (Signed)
Take antibiotics Steroid shot given Use nasal spray or salt water spray F/u as needed

## 2017-04-15 NOTE — Progress Notes (Signed)
   Subjective:    Patient ID: Sherry Kent, female    DOB: [birth year], 71 y.o.   MRN: [id number]  Patient presents for Illness (x3 weeks- head congestion, sinus pressure, HA, nasal drainage)  Patient here with sinus pressure drainage congestion for the past 3 weeks. She's been using over-the-counter medications including Benadryl and Sudafed. She's not had any fever. No difficulty breathing occasional cough more from drainage. She has been taking her Zyrtec daily but symptoms are still not improving.   Review Of Systems:  GEN- denies fatigue, fever, weight loss,weakness, recent illness HEENT- denies eye drainage, change in vision,+ nasal discharge, CVS- denies chest pain, palpitations RESP- denies SOB, cough, wheeze ABD- denies N/V, change in stools, abd pain Neuro- denies headache, dizziness, syncope, seizure activity       Objective:    BP 138/78   Pulse 82   Temp 98.8 F (37.1 C) (Oral)   Resp 16   Ht 5\' 6"  (1.676 m)   Wt 169 lb (76.7 kg)   SpO2 97%   BMI 27.28 kg/m  GEN- NAD, alert and oriented x3 HEENT- PERRL, EOMI, non injected sclera, pink conjunctiva, MMM, oropharynxclear, post nasal drip, TM clear bilat no effusion, No maxillary sinus tenderness, inflammed turbinates,  Nasal drainage  Neck- Supple, shotty ant  LAD CVS- RRR, no murmur RESP-CTAB EXT- No edema Pulses- Radial 2+          Assessment & Plan:      Problem List Items Addressed This Visit    None    Visit Diagnoses    Acute non-recurrent maxillary sinusitis    -  Primary   Amox 875mg  BID, Depo medrol shot given, D/C Sudafed with cardic history, flonase/nasal saline   Relevant Medications   amoxicillin (AMOXIL) 875 MG tablet   fluticasone (FLONASE) 50 MCG/ACT nasal spray      Note: This dictation was prepared with Dragon dictation along with smaller phrase technology. Any transcriptional errors that result from this process are unintentional.

## 2017-05-06 ENCOUNTER — Other Ambulatory Visit: Payer: Self-pay | Admitting: Family Medicine

## 2017-05-12 ENCOUNTER — Encounter (HOSPITAL_COMMUNITY): Payer: Self-pay | Admitting: Emergency Medicine

## 2017-05-12 DIAGNOSIS — Z87891 Personal history of nicotine dependence: Secondary | ICD-10-CM | POA: Diagnosis not present

## 2017-05-12 DIAGNOSIS — Z79899 Other long term (current) drug therapy: Secondary | ICD-10-CM | POA: Diagnosis not present

## 2017-05-12 DIAGNOSIS — R112 Nausea with vomiting, unspecified: Secondary | ICD-10-CM | POA: Insufficient documentation

## 2017-05-12 DIAGNOSIS — R1110 Vomiting, unspecified: Secondary | ICD-10-CM | POA: Diagnosis present

## 2017-05-12 DIAGNOSIS — Z7982 Long term (current) use of aspirin: Secondary | ICD-10-CM | POA: Diagnosis not present

## 2017-05-12 DIAGNOSIS — I10 Essential (primary) hypertension: Secondary | ICD-10-CM | POA: Insufficient documentation

## 2017-05-12 LAB — CBC
HEMATOCRIT: 41.2 % (ref 36.0–46.0)
Hemoglobin: 13.2 g/dL (ref 12.0–15.0)
MCH: 27.7 pg (ref 26.0–34.0)
MCHC: 32 g/dL (ref 30.0–36.0)
MCV: 86.6 fL (ref 78.0–100.0)
Platelets: 347 10*3/uL (ref 150–400)
RBC: 4.76 MIL/uL (ref 3.87–5.11)
RDW: 15.6 % — ABNORMAL HIGH (ref 11.5–15.5)
WBC: 15.4 10*3/uL — AB (ref 4.0–10.5)

## 2017-05-12 LAB — LIPASE, BLOOD: LIPASE: 30 U/L (ref 11–51)

## 2017-05-12 LAB — COMPREHENSIVE METABOLIC PANEL
ALT: 20 U/L (ref 14–54)
AST: 26 U/L (ref 15–41)
Albumin: 4.3 g/dL (ref 3.5–5.0)
Alkaline Phosphatase: 102 U/L (ref 38–126)
Anion gap: 13 (ref 5–15)
BUN: 16 mg/dL (ref 6–20)
CHLORIDE: 105 mmol/L (ref 101–111)
CO2: 21 mmol/L — AB (ref 22–32)
Calcium: 9.6 mg/dL (ref 8.9–10.3)
Creatinine, Ser: 1.16 mg/dL — ABNORMAL HIGH (ref 0.44–1.00)
GFR, EST AFRICAN AMERICAN: 53 mL/min — AB (ref >60)
GFR, EST NON AFRICAN AMERICAN: 46 mL/min — AB (ref >60)
Glucose, Bld: 209 mg/dL — ABNORMAL HIGH (ref 65–99)
POTASSIUM: 4 mmol/L (ref 3.5–5.1)
SODIUM: 139 mmol/L (ref 135–145)
Total Bilirubin: 0.6 mg/dL (ref 0.3–1.2)
Total Protein: 7.7 g/dL (ref 6.5–8.1)

## 2017-05-12 MED ORDER — ONDANSETRON 4 MG PO TBDP
ORAL_TABLET | ORAL | Status: AC
  Filled 2017-05-12: qty 1

## 2017-05-12 MED ORDER — ONDANSETRON 4 MG PO TBDP
4.0000 mg | ORAL_TABLET | Freq: Once | ORAL | Status: AC | PRN
  Administered 2017-05-12: 4 mg via ORAL

## 2017-05-12 NOTE — ED Triage Notes (Addendum)
Pt reports she woke up this morning vomiting and has not been been able to stop, Pt denies any other symptoms, diaharria, weakness, dizziness, SOB, chest pain.  Pt reports she was unable to keep her blood pressure medication down for the past two days.

## 2017-05-13 ENCOUNTER — Emergency Department (HOSPITAL_COMMUNITY)
Admission: EM | Admit: 2017-05-13 | Discharge: 2017-05-13 | Disposition: A | Discharge status: Home / Self Care | Payer: Medicare Other | Attending: Emergency Medicine | Admitting: Emergency Medicine

## 2017-05-13 DIAGNOSIS — R112 Nausea with vomiting, unspecified: Secondary | ICD-10-CM

## 2017-05-13 DIAGNOSIS — I10 Essential (primary) hypertension: Secondary | ICD-10-CM

## 2017-05-13 LAB — URINALYSIS, ROUTINE W REFLEX MICROSCOPIC
BACTERIA UA: NONE SEEN
Bilirubin Urine: NEGATIVE
Glucose, UA: NEGATIVE mg/dL
Ketones, ur: NEGATIVE mg/dL
Leukocytes, UA: NEGATIVE
Nitrite: NEGATIVE
Specific Gravity, Urine: 1.025 (ref 1.005–1.030)
pH: 5 (ref 5.0–8.0)

## 2017-05-13 MED ORDER — SODIUM CHLORIDE 0.9 % IV BOLUS (SEPSIS)
1000.0000 mL | Freq: Once | INTRAVENOUS | Status: AC
  Administered 2017-05-13: 1000 mL via INTRAVENOUS

## 2017-05-13 MED ORDER — ONDANSETRON 4 MG PO TBDP: 4.0000 mg | ORAL_TABLET | Freq: Three times a day (TID) | ORAL | 0 refills | Status: DC | PRN

## 2017-05-13 MED ORDER — HYDROCHLOROTHIAZIDE 25 MG PO TABS
25.0000 mg | ORAL_TABLET | Freq: Once | ORAL | Status: AC
  Administered 2017-05-13: 25 mg via ORAL
  Filled 2017-05-13: qty 1

## 2017-05-13 MED ORDER — PROMETHAZINE HCL 25 MG/ML IJ SOLN
12.5000 mg | INTRAMUSCULAR | Status: DC | PRN
  Administered 2017-05-13: 12.5 mg via INTRAVENOUS
  Filled 2017-05-13: qty 1

## 2017-05-13 MED ORDER — ONDANSETRON HCL 4 MG/2ML IJ SOLN
4.0000 mg | Freq: Once | INTRAMUSCULAR | Status: AC
  Administered 2017-05-13: 4 mg via INTRAVENOUS
  Filled 2017-05-13: qty 2

## 2017-05-13 MED ORDER — AMLODIPINE BESYLATE 5 MG PO TABS
5.0000 mg | ORAL_TABLET | Freq: Once | ORAL | Status: AC
  Administered 2017-05-13: 5 mg via ORAL
  Filled 2017-05-13: qty 1

## 2017-05-13 MED ORDER — LISINOPRIL 20 MG PO TABS
20.0000 mg | ORAL_TABLET | Freq: Once | ORAL | Status: AC
  Administered 2017-05-13: 20 mg via ORAL
  Filled 2017-05-13: qty 1

## 2017-05-13 MED ORDER — PROMETHAZINE HCL 25 MG PO TABS: 12.5000 mg | ORAL_TABLET | Freq: Three times a day (TID) | ORAL | 0 refills | Status: DC | PRN

## 2017-05-13 NOTE — ED Provider Notes (Signed)
TIME SEEN: 1:40 AM  CHIEF COMPLAINT: Nausea and vomiting  HPI: Patient is a 71 year old female with history of hypertension, hyperlipidemia who presents to the emergency department with complaints of nausea and vomiting. Symptoms started 2 days ago. She denies chest pain or chest discomfort, shortness of breath, abdominal pain, diarrhea, fer blood pressure has been elevated but it is because she cannot keep down her blood pressure medications. She is on lisinopril, HCTZ and amlodipine. No headache, numbness, tingling or focal weakness. No vision changes. No sick contact. No recent antibiotic use or hospitalization. No recent travel.  ROS: See HPI Constitutional: no fever  Eyes: no drainage  ENT: no runny nose   Cardiovascular:  no chest pain  Resp: no SOB  GI:  vomiting GU: no dysuria Integumentary: no rash  Allergy: no hives  Musculoskeletal: no leg swelling  Neurological: no slurred speech ROS otherwise negative  PAST MEDICAL HISTORY/PAST SURGICAL HISTORY:  Past Medical History:  Diagnosis Date  . Anxiety   . H/O hematuria   . History of colon polyps   . Hyperlipidemia   . Hypertension     MEDICATIONS:  Prior to Admission medications   Medication Sig Start Date End Date Taking? Authorizing Provider  amoxicillin (AMOXIL) 875 MG tablet Take 1 tablet (875 mg total) by mouth 2 (two) times daily. 04/15/17   Alycia Rossetti, MD  aspirin EC 81 MG tablet Take 81 mg by mouth daily.    [provider]  BYSTOLIC 5 MG tablet TAKE 1 TABLET BY MOUTH EVERY DAY 03/21/17   Alycia Rossetti, MD  Calcium Carbonate-Vit D-Min (CALCIUM 1200 PO) Take by mouth. In divided doses    [provider]  cetirizine (ZYRTEC) 10 MG tablet TAKE 1 TABLET BY MOUTH EVERY DAY 09/03/16   Orlena Sheldon, PA-C  Cholecalciferol (SM VITAMIN D3) 4000 UNITS CAPS Take 1 capsule (4,000 Units total) by mouth daily. 06/29/15   Orlena Sheldon, PA-C  Coenzyme Q10 10 MG capsule Take 10 mg by mouth daily.     [provider]  cyanocobalamin 1000 MCG tablet Take 100 mcg by mouth daily.    [provider]  diclofenac (VOLTAREN) 50 MG EC tablet TAKE 1 TABLET(50 MG) BY MOUTH TWICE DAILY 05/07/17   [place], Modena Nunnery, MD  fluticasone [facility]) 50 MCG/ACT nasal spray Place 2 sprays into both nostrils daily. 04/15/17   Alycia Rossetti, MD  lisinopril (PRINIVIL,ZESTRIL) 40 MG tablet TAKE 1 TABLET(40 MG) BY MOUTH DAILY 01/06/17   Alycia Rossetti, MD  omeprazole (PRILOSEC) 20 MG capsule TAKE 1 CAPSULE BY MOUTH EVERY DAY 10/23/16   Alycia Rossetti, MD  rosuvastatin (CRESTOR) 20 MG tablet Take 1 tablet (20 mg total) by mouth daily. 10/23/16   Alycia Rossetti, MD  sertraline (ZOLOFT) 50 MG tablet Take 1 tablet (50 mg total) by mouth daily. 10/23/16   Alycia Rossetti, MD    ALLERGIES:  Allergies  Allergen Reactions  . Norvasc [Amlodipine Besylate] Swelling    10mg  dose causes swelling (07/19/2015).  Tolerates 5mg  with no swelling.    SOCIAL HISTORY:  Social History  Substance Use Topics  . Smoking status: Former Smoker    Packs/day: 1.00    Years: 30.00    Types: Cigarettes    Quit date: 10/27/2003  . Smokeless tobacco: Never Used  . Alcohol use 0.0 oz/week     Comment: socially    FAMILY HISTORY: Family History  Problem Relation Age of Onset  . Colon  cancer Sister     EXAM: BP (!) 206/75 (BP Location: Right Arm)   Pulse 81   Temp 98.4 F (36.9 C) (Oral)   Resp 16   Ht 5\' 6"  (1.676 m)   Wt 79.4 kg (175 lb)   SpO2 98%   BMI 28.25 kg/m  CONSTITUTIONAL: Alert and oriented and responds appropriately to questions. Well-appearing; well-nourished, Elderly, in no distress HEAD: Normocephalic EYES: Conjunctivae clear, pupils appear equal, EOMI ENT: normal nose; moist mucous membranes NECK: Supple, no meningismus, no nuchal rigidity, no LAD  CARD: RRR; S1 and S2 appreciated; no murmurs, no clicks, no rubs, no gallops RESP: Normal chest excursion without splinting or  tachypnea; breath sounds clear and equal bilaterally; no wheezes, no rhonchi, no rales, no hypoxia or respiratory distress, speaking full sentences ABD/GI: Normal bowel sounds; non-distended; soft, non-tender, no rebound, no guarding, no peritoneal signs, no hepatosplenomegaly BACK:  The back appears normal and is non-tender to palpation, there is no CVA tenderness EXT: Normal ROM in all joints; non-tender to palpation; no edema; normal capillary refill; no cyanosis, no calf tenderness or swelling    SKIN: Normal color for age and race; warm; no rash NEURO: Moves all extremities equally PSYCH: The patient's mood and manner are appropriate. Grooming and personal hygiene are appropriate.  MEDICAL DECISION MAKING: Patient here with vomiting for the past couple of days. Given Zofran in the emergency department and reports feeling better but states she is still have 3 episodes of emesis. Emesis is nonbloody, nonbilious. Her abdominal exam is benign. She denies any chest pain. She is hypertensive but attributes this to not being able to keep down any of her blood pressure medication. Will treat with a dose of IV Zofran and give IV fluids. Once her nausea is under control I will give her a dose of her home medications.  ED PROGRESS: Patient reports feeling much better after IV Zofran and her blood pressure is improving after her medications. We will continue to monitor her and fluid challenge patient.  Patient had one episode of vomiting after IV Zofran. Will give dose of IV Phenergan and reassess.   Patient reports she is feeling much better and would like discharge home after IV Phenergan. Has been able to drink. Her blood pressure is now the 130s/50s. Urine shows proteinuria and hematuria but no sign of infection. She will follow-up closely with her primary care physician. Suspect viral illness causing her symptoms. We'll discharge with prescriptions of Zofran and Phenergan to take at home as needed.  Discussed return precautions with patient and her daughter. They're comfortable with this plan.   At this time, I do not feel there is any life-threatening condition present. I have reviewed and discussed all results (EKG, imaging, lab, urine as appropriate) and exam findings with patient/family. I have reviewed nursing notes and appropriate previous records.  I feel the patient is safe to be discharged home without further emergent workup and can continue workup as an outpatient as needed. Discussed usual and customary return precautions. Patient/family verbalize understanding and are comfortable with this plan.  Outpatient follow-up has been provided if needed. All questions have been answered.      Ahlayah Tarkowski, Delice Bison, DO 05/13/17 [phone]

## 2017-05-22 ENCOUNTER — Ambulatory Visit (INDEPENDENT_AMBULATORY_CARE_PROVIDER_SITE_OTHER): Payer: Medicare Other | Admitting: Physician Assistant

## 2017-05-22 ENCOUNTER — Encounter: Payer: Self-pay | Admitting: Physician Assistant

## 2017-05-22 VITALS — BP 144/86 | HR 64 | Temp 99.0°F | Resp 18 | Wt 168.6 lb

## 2017-05-22 DIAGNOSIS — J988 Other specified respiratory disorders: Secondary | ICD-10-CM

## 2017-05-22 DIAGNOSIS — B9689 Other specified bacterial agents as the cause of diseases classified elsewhere: Principal | ICD-10-CM

## 2017-05-22 MED ORDER — LEVOFLOXACIN 750 MG PO TABS: 750.0000 mg | ORAL_TABLET | Freq: Every day | ORAL | 0 refills | Status: DC

## 2017-05-22 NOTE — Progress Notes (Signed)
Patient ID: KINLIE JANICE MRN: [id number], DOB: 03/23/1946, 71 y.o. Date of Encounter: 05/22/2017, 2:14 PM    Chief Complaint:  Chief Complaint  Patient presents with  . 1 mth sinus inf and congestion     HPI: 71 y.o. year old female presents with above.   She reports that she had visit with Dr. Buelah Manis and was prescribed amoxicillin and symptoms "seemed like they were getting better but then they came back." I reviewed chart. Her visit with Dr. Buelah Manis was on 04/15/17. Was treated with Depo-Medrol and amoxicillin at that visit. Prior to that visit her symptoms were consistent with sinusitis.   Now patient states that things have moved into her chest. Now her worst symptom is chest congestion and cough. However she does continue to have some head congestion also and last week was blowing out green mucus.  No known fevers or chills. No significant sore throat or ear ache. No other concerns to address today.     Home Meds:   Outpatient Medications Prior to Visit  Medication Sig Dispense Refill  . aspirin EC 81 MG tablet Take 81 mg by mouth daily.    Marland Kitchen BYSTOLIC 5 MG tablet TAKE 1 TABLET BY MOUTH EVERY DAY 30 tablet 0  . Calcium Carbonate-Vit D-Min (CALCIUM 1200 PO) Take by mouth. In divided doses    . cetirizine (ZYRTEC) 10 MG tablet TAKE 1 TABLET BY MOUTH EVERY DAY 30 tablet 11  . Cholecalciferol (SM VITAMIN D3) 4000 UNITS CAPS Take 1 capsule (4,000 Units total) by mouth daily. 30 capsule 11  . Coenzyme Q10 10 MG capsule Take 10 mg by mouth daily.    . cyanocobalamin 1000 MCG tablet Take 100 mcg by mouth daily.    Marland Kitchen lisinopril (PRINIVIL,ZESTRIL) 40 MG tablet TAKE 1 TABLET(40 MG) BY MOUTH DAILY 30 tablet 0  . omeprazole (PRILOSEC) 20 MG capsule TAKE 1 CAPSULE BY MOUTH EVERY DAY 90 capsule 2  . ondansetron (ZOFRAN ODT) 4 MG disintegrating tablet Take 1 tablet (4 mg total) by mouth every 8 (eight) hours as needed for nausea or vomiting. 20 tablet 0  . promethazine (PHENERGAN) 25 MG  tablet Take 0.5-1 tablets (12.5-25 mg total) by mouth every 8 (eight) hours as needed for nausea or vomiting. 10 tablet 0  . rosuvastatin (CRESTOR) 20 MG tablet Take 1 tablet (20 mg total) by mouth daily. 90 tablet 2  . sertraline (ZOLOFT) 50 MG tablet Take 1 tablet (50 mg total) by mouth daily. 90 tablet 2   No facility-administered medications prior to visit.     Allergies:  Allergies  Allergen Reactions  . Norvasc [Amlodipine Besylate] Swelling    10mg  dose causes swelling (07/19/2015).  Tolerates 5mg  with no swelling.      Review of Systems: See HPI for pertinent ROS. All other ROS negative.    Physical Exam: Blood pressure (!) 144/86, pulse 64, temperature 99 F (37.2 C), temperature source Oral, resp. rate 18, weight 76.5 kg (168 lb 9.6 oz)., Body mass index is 27.21 kg/m. General:  WF. Appears in no acute distress. HEENT: Normocephalic, atraumatic, eyes without discharge, sclera non-icteric, nares are without discharge. Bilateral auditory canals clear, TM's are without perforation, pearly grey and translucent with reflective cone of light bilaterally. Oral cavity moist, posterior pharynx without exudate, erythema, peritonsillar abscess. No tenderness with percussion to frontal or maxillary sinuses bilaterally.  Neck: Supple. No thyromegaly. No lymphadenopathy. Lungs: Clear bilaterally to auscultation without wheezes, rales, or rhonchi. Breathing is unlabored. She  does have congested cough through visit today. However lungs sound clear on exam. Heart: Regular rhythm. No murmurs, rubs, or gallops. Msk:  Strength and tone normal for age. Extremities/Skin: Warm and dry.  Neuro: Alert and oriented X 3. Moves all extremities spontaneously. Gait is normal. CNII-XII grossly in tact. Psych:  Responds to questions appropriately with a normal affect.     ASSESSMENT AND PLAN:  71 y.o. year old female with  1. Bacterial respiratory infection She had symptoms for 3 weeks prior to her  visit on 04/15/17. Has been sick for over 6 weeks now. Infection do not resolve with amoxicillin. Therefore, at this time will treat with Levaquin. She is to take the Levaquin as directed. Follow-up if symptoms do not resolve up and completion of this. - levofloxacin (LEVAQUIN) 750 MG tablet; Take 1 tablet (750 mg total) by mouth daily.  Dispense: 7 tablet; Refill: 0   Signed, [id number] [place], [place], [facility] 05/22/2017 2:14 PM

## 2017-06-05 ENCOUNTER — Ambulatory Visit: Payer: Self-pay | Admitting: Physician Assistant

## 2017-06-06 ENCOUNTER — Encounter: Payer: Self-pay | Admitting: Family Medicine

## 2017-06-12 ENCOUNTER — Ambulatory Visit: Payer: Medicare Other | Admitting: Physician Assistant

## 2017-06-24 ENCOUNTER — Ambulatory Visit (INDEPENDENT_AMBULATORY_CARE_PROVIDER_SITE_OTHER): Payer: Medicare Other

## 2017-06-24 DIAGNOSIS — Z23 Encounter for immunization: Secondary | ICD-10-CM | POA: Diagnosis not present

## 2017-06-24 NOTE — Progress Notes (Signed)
Patient was seen in office to receive flu vaccine. Patient received flu vaccine in  Right deltoid patient tolerated well

## 2017-06-27 ENCOUNTER — Other Ambulatory Visit: Payer: Self-pay | Admitting: Family Medicine

## 2017-09-04 ENCOUNTER — Other Ambulatory Visit: Payer: Self-pay

## 2017-09-04 ENCOUNTER — Encounter: Payer: Self-pay | Admitting: Physician Assistant

## 2017-09-04 ENCOUNTER — Ambulatory Visit: Payer: Medicare Other | Admitting: Physician Assistant

## 2017-09-04 VITALS — BP 160/86 | HR 82 | Temp 97.8°F | Resp 16 | Ht 66.0 in | Wt 171.2 lb

## 2017-09-04 DIAGNOSIS — J988 Other specified respiratory disorders: Secondary | ICD-10-CM

## 2017-09-04 DIAGNOSIS — B9689 Other specified bacterial agents as the cause of diseases classified elsewhere: Principal | ICD-10-CM

## 2017-09-04 MED ORDER — AZITHROMYCIN 250 MG PO TABS: ORAL_TABLET | ORAL | 0 refills | Status: DC

## 2017-09-04 NOTE — Progress Notes (Signed)
Patient ID: Sherry Kent MRN: [id number], DOB: March 06, 1946, 72 y.o. Date of Encounter: 09/04/2017, 10:06 AM    Chief Complaint:  Chief Complaint  Patient presents with  . chest congestion    x4days  . Cough    x4days     HPI: 72 y.o. year old female presents with above.   She reports that she is also having some congestion in her head and nose but the congestion in her chest is worse.  Is coughing up thick dark green phlegm.  Has had no significant sore throat.  No earache.  No fevers or chills.     Home Meds:   Outpatient Medications Prior to Visit  Medication Sig Dispense Refill  . aspirin EC 81 MG tablet Take 81 mg by mouth daily.    Marland Kitchen BYSTOLIC 5 MG tablet TAKE 1 TABLET BY MOUTH EVERY DAY 30 tablet 0  . Calcium Carbonate-Vit D-Min (CALCIUM 1200 PO) Take by mouth. In divided doses    . cetirizine (ZYRTEC) 10 MG tablet TAKE 1 TABLET BY MOUTH EVERY DAY 30 tablet 11  . Cholecalciferol (SM VITAMIN D3) 4000 UNITS CAPS Take 1 capsule (4,000 Units total) by mouth daily. 30 capsule 11  . Coenzyme Q10 10 MG capsule Take 10 mg by mouth daily.    . cyanocobalamin 1000 MCG tablet Take 100 mcg by mouth daily.    Marland Kitchen lisinopril (PRINIVIL,ZESTRIL) 40 MG tablet TAKE 1 TABLET(40 MG) BY MOUTH DAILY 30 tablet 0  . omeprazole (PRILOSEC) 20 MG capsule TAKE 1 CAPSULE BY MOUTH EVERY DAY 90 capsule 2  . ondansetron (ZOFRAN ODT) 4 MG disintegrating tablet Take 1 tablet (4 mg total) by mouth every 8 (eight) hours as needed for nausea or vomiting. 20 tablet 0  . promethazine (PHENERGAN) 25 MG tablet Take 0.5-1 tablets (12.5-25 mg total) by mouth every 8 (eight) hours as needed for nausea or vomiting. 10 tablet 0  . rosuvastatin (CRESTOR) 20 MG tablet TAKE 1 TABLET(20 MG) BY MOUTH DAILY 90 tablet 0  . sertraline (ZOLOFT) 50 MG tablet Take 1 tablet (50 mg total) by mouth daily. 90 tablet 2  . levofloxacin (LEVAQUIN) 750 MG tablet Take 1 tablet (750 mg total) by mouth daily. 7 tablet 0   No  facility-administered medications prior to visit.     Allergies:  Allergies  Allergen Reactions  . Norvasc [Amlodipine Besylate] Swelling    10mg  dose causes swelling (07/19/2015).  Tolerates 5mg  with no swelling.      Review of Systems: See HPI for pertinent ROS. All other ROS negative.    Physical Exam: Blood pressure (!) 160/86, pulse 82, temperature 97.8 F (36.6 C), temperature source Oral, resp. rate 16, height 5\' 6"  (1.676 m), weight 77.7 kg (171 lb 3.2 oz), SpO2 98 %., Body mass index is 27.63 kg/m. General: WF.  Appears in no acute distress. HEENT: Normocephalic, atraumatic, eyes without discharge, sclera non-icteric, nares are without discharge. Bilateral auditory canals clear, TM's are without perforation, pearly grey and translucent with reflective cone of light bilaterally. Oral cavity moist, posterior pharynx without exudate, erythema, peritonsillar abscess.  Neck: Supple. No thyromegaly. No lymphadenopathy. Lungs: Clear bilaterally to auscultation without wheezes, rales, or rhonchi. Breathing is unlabored. Heart: Regular rhythm. No murmurs, rubs, or gallops. Msk:  Strength and tone normal for age. Extremities/Skin: Warm and dry.  Neuro: Alert and oriented X 3. Moves all extremities spontaneously. Gait is normal. CNII-XII grossly in tact. Psych:  Responds to questions appropriately with a normal affect.  ASSESSMENT AND PLAN:  72 y.o. year old female with  1. Bacterial respiratory infection She is to take antibiotic as directed.  Follow-up if symptoms do not resolve within 1 week after completion of antibiotic.  Also take Mucinex DM as expectorant. - azithromycin (ZITHROMAX) 250 MG tablet; Day 1: Take 2 daily. Days 2 -5: Take 1 daily.  Dispense: 6 tablet; Refill: 0   Signed, [address] [place], [place], [facility] 09/04/2017 10:06 AM

## 2017-10-23 ENCOUNTER — Other Ambulatory Visit: Payer: Self-pay | Admitting: Family Medicine

## 2017-11-03 ENCOUNTER — Other Ambulatory Visit: Payer: Self-pay | Admitting: Family Medicine

## 2018-01-24 ENCOUNTER — Other Ambulatory Visit: Payer: Self-pay | Admitting: Family Medicine

## 2018-01-28 ENCOUNTER — Other Ambulatory Visit: Payer: Self-pay | Admitting: Family Medicine

## 2018-02-01 ENCOUNTER — Other Ambulatory Visit: Payer: Self-pay | Admitting: Family Medicine

## 2018-04-08 ENCOUNTER — Other Ambulatory Visit: Payer: Self-pay | Admitting: Family Medicine

## 2018-04-17 ENCOUNTER — Other Ambulatory Visit: Payer: Self-pay | Admitting: Family Medicine

## 2018-05-05 ENCOUNTER — Other Ambulatory Visit: Payer: Self-pay | Admitting: Family Medicine

## 2018-05-06 ENCOUNTER — Other Ambulatory Visit: Payer: Self-pay | Admitting: Family Medicine

## 2018-05-06 NOTE — Telephone Encounter (Signed)
Medication filled x1 with no refills.   Requires office visit before any further refills can be given.   Letter sent.  

## 2018-05-11 ENCOUNTER — Other Ambulatory Visit: Payer: Self-pay | Admitting: Family Medicine

## 2018-06-22 ENCOUNTER — Other Ambulatory Visit: Payer: Self-pay | Admitting: Family Medicine

## 2018-06-24 ENCOUNTER — Encounter: Payer: Medicare Other | Admitting: Physician Assistant

## 2018-07-24 ENCOUNTER — Encounter: Payer: Self-pay | Admitting: Family Medicine

## 2018-12-07 ENCOUNTER — Ambulatory Visit (INDEPENDENT_AMBULATORY_CARE_PROVIDER_SITE_OTHER): Payer: Medicare Other | Admitting: Family Medicine

## 2018-12-07 ENCOUNTER — Other Ambulatory Visit: Payer: Self-pay

## 2018-12-07 ENCOUNTER — Encounter: Payer: Self-pay | Admitting: Family Medicine

## 2018-12-07 DIAGNOSIS — F325 Major depressive disorder, single episode, in full remission: Secondary | ICD-10-CM | POA: Diagnosis not present

## 2018-12-07 DIAGNOSIS — E7800 Pure hypercholesterolemia, unspecified: Secondary | ICD-10-CM

## 2018-12-07 DIAGNOSIS — I129 Hypertensive chronic kidney disease with stage 1 through stage 4 chronic kidney disease, or unspecified chronic kidney disease: Secondary | ICD-10-CM | POA: Diagnosis not present

## 2018-12-07 DIAGNOSIS — N183 Chronic kidney disease, stage 3 unspecified: Secondary | ICD-10-CM

## 2018-12-07 DIAGNOSIS — I10 Essential (primary) hypertension: Secondary | ICD-10-CM

## 2018-12-07 DIAGNOSIS — R7302 Impaired glucose tolerance (oral): Secondary | ICD-10-CM | POA: Diagnosis not present

## 2018-12-07 MED ORDER — NEBIVOLOL HCL 5 MG PO TABS: ORAL_TABLET | ORAL | 1 refills | Status: DC

## 2018-12-07 MED ORDER — ROSUVASTATIN CALCIUM 20 MG PO TABS: 20.0000 mg | ORAL_TABLET | Freq: Every day | ORAL | 1 refills | Status: DC

## 2018-12-07 MED ORDER — SERTRALINE HCL 50 MG PO TABS
ORAL_TABLET | ORAL | 1 refills | Status: DC
Start: 2018-12-07 — End: 2019-03-15

## 2018-12-07 MED ORDER — LISINOPRIL 40 MG PO TABS: ORAL_TABLET | ORAL | 1 refills | Status: DC

## 2018-12-07 NOTE — Progress Notes (Signed)
Virtual Visit via Telephone Note  I connected with Sherry Kent on 12/07/18 at 10:25am  by telephone and verified that I am speaking with the correct person using two identifiers.   Pt location: at home    Physician location: In office    I discussed the limitations, risks, security and privacy concerns of performing an evaluation and management service by telephone and the availability of in person appointments. I also discussed with the patient that there may be a patient responsible charge related to this service. The patient expressed understanding and agreed to proceed.   History of Present Illness: Patient's last visit in our office was January 2019 at that time it was for an acute illness.  I have not seen her since August 2018. She does not have any other physicians documented within epic Medications reviewed in detail That she feels well.  She has not had any major problems over the past year and a half.  Was taking care of her sister with cancer so did not come in, her sister passed away.  Does have nieces and nephews who help her.  But she is still able to do for herself drive pay her own bills.  Out of all her medications for past 3 weeks  HTN- lisinopril 40mg  and bystolic 5mg , no chest pain or SOB,taking ASA OTC  Hyperlipedemia- was on crestor 20mg ,, denies any side effects such as leg cramps   GERD- no longer needs omeprazole, symptoms under control with diet   OA- no longer uses diclofenac, uses tylenol   MDD- was on zoloft for past 3 weeks, has been more tearful, stressed, would like to restart 50mg    Supplements- OTC B12, cacluim, viatmin D    Observations/Objective: Phone visit-distress noted over the phone.  Assessment and Plan: Hypertension-start lisinopril and Bystolic she has been out of this for 3 weeks.  She will come in tomorrow for fasting labs as we do not have a renal function or level panel on her in the past year and a half.  Her lipidemia restart  Crestor 20 mg she will come in tomorrow for LFTs and lipid panel  Osteoarthritis she is using acetaminophen as needed  Major depression with history of panic attacks resume Zoloft that she has been off for 3 weeks at her age I am to have her take a half a tablet which is 25 mg for 1 week then go back to the 50 mg.  CKD- check renal function   History of glucose intolerance, check AC   Follow Up Instructions:    I discussed the assessment and treatment plan with the patient. The patient was provided an opportunity to ask questions and all were answered. The patient agreed with the plan and demonstrated an understanding of the instructions.   The patient was advised to call back or seek an in-person evaluation if the symptoms worsen or if the condition fails to improve as anticipated.  I provided 11 minutes of non-face-to-face time during this encounter.  End Time 10:38  Vic Blackbird, MD

## 2018-12-08 ENCOUNTER — Other Ambulatory Visit: Payer: Self-pay

## 2018-12-08 ENCOUNTER — Other Ambulatory Visit: Payer: Medicare Other

## 2018-12-08 DIAGNOSIS — R7302 Impaired glucose tolerance (oral): Secondary | ICD-10-CM

## 2018-12-08 DIAGNOSIS — E7800 Pure hypercholesterolemia, unspecified: Secondary | ICD-10-CM

## 2018-12-08 DIAGNOSIS — I10 Essential (primary) hypertension: Secondary | ICD-10-CM

## 2018-12-09 ENCOUNTER — Other Ambulatory Visit: Payer: Self-pay | Admitting: *Deleted

## 2018-12-09 LAB — COMPREHENSIVE METABOLIC PANEL
AG Ratio: 1.4 (calc) (ref 1.0–2.5)
ALT: 9 U/L (ref 6–29)
AST: 14 U/L (ref 10–35)
Albumin: 4.2 g/dL (ref 3.6–5.1)
Alkaline phosphatase (APISO): 92 U/L (ref 37–153)
BUN/Creatinine Ratio: 16 (calc) (ref 6–22)
BUN: 18 mg/dL (ref 7–25)
CO2: 27 mmol/L (ref 20–32)
Calcium: 9.4 mg/dL (ref 8.6–10.4)
Chloride: 103 mmol/L (ref 98–110)
Creat: 1.14 mg/dL — ABNORMAL HIGH (ref 0.60–0.93)
Globulin: 2.9 g/dL (calc) (ref 1.9–3.7)
Glucose, Bld: 96 mg/dL (ref 65–99)
Potassium: 4.3 mmol/L (ref 3.5–5.3)
Sodium: 141 mmol/L (ref 135–146)
Total Bilirubin: 0.4 mg/dL (ref 0.2–1.2)
Total Protein: 7.1 g/dL (ref 6.1–8.1)

## 2018-12-09 LAB — LIPID PANEL
Cholesterol: 269 mg/dL — ABNORMAL HIGH (ref <200)
HDL: 43 mg/dL — ABNORMAL LOW (ref >=50)
LDL Cholesterol (Calc): 160 mg/dL (calc) — ABNORMAL HIGH
Non-HDL Cholesterol (Calc): 226 mg/dL (calc) — ABNORMAL HIGH (ref <130)
Total CHOL/HDL Ratio: 6.3 (calc) — ABNORMAL HIGH (ref <5.0)
Triglycerides: 389 mg/dL — ABNORMAL HIGH (ref <150)

## 2018-12-09 LAB — CBC WITH DIFFERENTIAL/PLATELET
Absolute Monocytes: 594 cells/uL (ref 200–950)
Basophils Absolute: 55 cells/uL (ref 0–200)
Basophils Relative: 0.5 %
Eosinophils Absolute: 231 cells/uL (ref 15–500)
Eosinophils Relative: 2.1 %
HCT: 42.5 % (ref 35.0–45.0)
Hemoglobin: 14.1 g/dL (ref 11.7–15.5)
Lymphs Abs: 2574 cells/uL (ref 850–3900)
MCH: 27.6 pg (ref 27.0–33.0)
MCHC: 33.2 g/dL (ref 32.0–36.0)
MCV: 83.2 fL (ref 80.0–100.0)
MPV: 10.2 fL (ref 7.5–12.5)
Monocytes Relative: 5.4 %
Neutro Abs: 7546 cells/uL (ref 1500–7800)
Neutrophils Relative %: 68.6 %
Platelets: 399 10*3/uL (ref 140–400)
RBC: 5.11 10*6/uL — ABNORMAL HIGH (ref 3.80–5.10)
RDW: 14.8 % (ref 11.0–15.0)
Total Lymphocyte: 23.4 %
WBC: 11 10*3/uL — ABNORMAL HIGH (ref 3.8–10.8)

## 2018-12-09 LAB — HEMOGLOBIN A1C
Hgb A1c MFr Bld: 6.1 % of total Hgb — ABNORMAL HIGH (ref <5.7)
Mean Plasma Glucose: 128 (calc)
eAG (mmol/L): 7.1 (calc)

## 2018-12-09 MED ORDER — OMEPRAZOLE 20 MG PO CPDR: 20.0000 mg | DELAYED_RELEASE_CAPSULE | Freq: Every day | ORAL | 3 refills | Status: DC

## 2019-03-15 ENCOUNTER — Other Ambulatory Visit: Payer: Self-pay | Admitting: Family Medicine

## 2019-03-15 ENCOUNTER — Encounter: Payer: Self-pay | Admitting: Family Medicine

## 2019-03-15 ENCOUNTER — Ambulatory Visit (INDEPENDENT_AMBULATORY_CARE_PROVIDER_SITE_OTHER): Payer: Medicare Other | Admitting: Family Medicine

## 2019-03-15 ENCOUNTER — Other Ambulatory Visit: Payer: Self-pay

## 2019-03-15 VITALS — BP 134/78 | HR 60 | Temp 98.2°F | Resp 14 | Ht 66.0 in | Wt 172.0 lb

## 2019-03-15 DIAGNOSIS — G8929 Other chronic pain: Secondary | ICD-10-CM

## 2019-03-15 DIAGNOSIS — E7800 Pure hypercholesterolemia, unspecified: Secondary | ICD-10-CM | POA: Diagnosis not present

## 2019-03-15 DIAGNOSIS — F325 Major depressive disorder, single episode, in full remission: Secondary | ICD-10-CM

## 2019-03-15 DIAGNOSIS — R7302 Impaired glucose tolerance (oral): Secondary | ICD-10-CM | POA: Diagnosis not present

## 2019-03-15 DIAGNOSIS — N183 Chronic kidney disease, stage 3 unspecified: Secondary | ICD-10-CM

## 2019-03-15 DIAGNOSIS — I10 Essential (primary) hypertension: Secondary | ICD-10-CM | POA: Diagnosis not present

## 2019-03-15 DIAGNOSIS — M79671 Pain in right foot: Secondary | ICD-10-CM

## 2019-03-15 MED ORDER — MELOXICAM 7.5 MG PO TABS: 7.5000 mg | ORAL_TABLET | Freq: Every day | ORAL | 0 refills | Status: DC

## 2019-03-15 MED ORDER — NEBIVOLOL HCL 5 MG PO TABS: ORAL_TABLET | ORAL | 1 refills | Status: DC

## 2019-03-15 MED ORDER — OMEPRAZOLE 20 MG PO CPDR: 20.0000 mg | DELAYED_RELEASE_CAPSULE | Freq: Every day | ORAL | 3 refills | Status: DC

## 2019-03-15 MED ORDER — ROSUVASTATIN CALCIUM 20 MG PO TABS: 20.0000 mg | ORAL_TABLET | Freq: Every day | ORAL | 1 refills | Status: DC

## 2019-03-15 MED ORDER — LISINOPRIL 40 MG PO TABS: ORAL_TABLET | ORAL | 1 refills | Status: DC

## 2019-03-15 MED ORDER — SERTRALINE HCL 50 MG PO TABS: ORAL_TABLET | ORAL | 1 refills | Status: DC

## 2019-03-15 NOTE — Assessment & Plan Note (Signed)
Blood pressure is controlled no change in medication.  Check her renal function today.

## 2019-03-15 NOTE — Assessment & Plan Note (Signed)
Recheck A1c.  She is borderline diabetic.  Work on dietary changes to monitor her sugar intake

## 2019-03-15 NOTE — Patient Instructions (Signed)
Take meloxicam once a day for inflammation Ice AND Stretch the heel epson salt soaks  We will call with lab results  F/U 4 months for physical

## 2019-03-15 NOTE — Assessment & Plan Note (Signed)
She is tolerating Crestor without any difficulties due for repeat lipid panel and liver function testing.

## 2019-03-15 NOTE — Progress Notes (Signed)
Subjective:    Patient ID: Sherry Kent, female    DOB: [birth year], 73 y.o.   MRN: [id number]  Patient presents for Follow-up (is fasting) and R Heel Pain (x months- pain in heel when walking)  Hypertension-patient was restarted back on her lisinopril and Bystolic back in April when she had phone visit.  She had been out of her medications for a few weeks.  States that her blood pressure is running good.  She does not have any side effects from the medications.  Hyperlipidemia restarted on Crestor -her lipid panel was significant elevated as expected he is due for repeat liver function testing and lipid panel today.    Major depression with history of panic attacks resumed Zoloft at last visit she is tolerating without any difficulty denies any feelings of depression or anxiety feels well with her current dose.  CKD- check renal function   History of glucose intolerance, check AC1/6.1%  Right heel pain for past few months, worse in the morning, when getting out of the bed, some days okay. She has not taken any pain relievers// has used apsercrema which helps a little she denies any injury to the foot no swelling or pain of the ankle    Review Of Systems:  GEN- denies fatigue, fever, weight loss,weakness, recent illness HEENT- denies eye drainage, change in vision, nasal discharge, CVS- denies chest pain, palpitations RESP- denies SOB, cough, wheeze ABD- denies N/V, change in stools, abd pain GU- denies dysuria, hematuria, dribbling, incontinence MSK- +joint pain, muscle aches, injury Neuro- denies headache, dizziness, syncope, seizure activity       Objective:    BP 134/78   Pulse 60   Temp 98.2 F (36.8 C) (Oral)   Resp 14   Ht 5\' 6"  (1.676 m)   Wt 172 lb (78 kg)   SpO2 96%   BMI 27.76 kg/m  GEN- NAD, alert and oriented x3 HEENT- PERRL, EOMI, non injected sclera, pink conjunctiva, MMM, oropharynx clear CVS- RRR, no murmur RESP-CTAB ABD-NABS,soft,NT,ND EXT- No  edema MSK- FROm bilat ankles, fair ROM bilat knees, no effusion, mild callus bilat feet, TTP at right  plantar insertion but also lateral aspect right heel, no erythema, no swelling Pulses- Radial, DP- 2+        Assessment & Plan:      Problem List Items Addressed This Visit      Unprioritized   Chronic kidney disease   Depression   Relevant Medications   sertraline (ZOLOFT) 50 MG tablet   Glucose intolerance (impaired glucose tolerance)    Recheck A1c.  She is borderline diabetic.  Work on dietary changes to monitor her sugar intake      Relevant Orders   Hemoglobin A1c   Hyperlipidemia    She is tolerating Crestor without any difficulties due for repeat lipid panel and liver function testing.      Relevant Medications   lisinopril (ZESTRIL) 40 MG tablet   nebivolol (BYSTOLIC) 5 MG tablet   rosuvastatin (CRESTOR) 20 MG tablet   Other Relevant Orders   Lipid panel   Hypertension - Primary    Blood pressure is controlled no change in medication.  Check her renal function today.      Relevant Medications   lisinopril (ZESTRIL) 40 MG tablet   nebivolol (BYSTOLIC) 5 MG tablet   rosuvastatin (CRESTOR) 20 MG tablet   Other Relevant Orders   CBC with Differential/Platelet   Comprehensive metabolic panel    Other Visit Diagnoses  Chronic heel pain, right       Right heel pain DD heel spur lateral aspct, likley some plantar fascitis with chronic nature, will try stretching, Mobic, ICe. NOT IMPROVED in 2 weeks referral to podiatry for imaging/injection   Relevant Medications   meloxicam (MOBIC) 7.5 MG tablet   sertraline (ZOLOFT) 50 MG tablet      Note: This dictation was prepared with Dragon dictation along with smaller phrase technology. Any transcriptional errors that result from this process are unintentional.

## 2019-03-16 LAB — COMPREHENSIVE METABOLIC PANEL
AG Ratio: 1.6 (calc) (ref 1.0–2.5)
ALT: 8 U/L (ref 6–29)
AST: 9 U/L — ABNORMAL LOW (ref 10–35)
Albumin: 4.1 g/dL (ref 3.6–5.1)
Alkaline phosphatase (APISO): 99 U/L (ref 37–153)
BUN/Creatinine Ratio: 15 (calc) (ref 6–22)
BUN: 16 mg/dL (ref 7–25)
CO2: 28 mmol/L (ref 20–32)
Calcium: 9.7 mg/dL (ref 8.6–10.4)
Chloride: 106 mmol/L (ref 98–110)
Creat: 1.1 mg/dL — ABNORMAL HIGH (ref 0.60–0.93)
Globulin: 2.5 g/dL (calc) (ref 1.9–3.7)
Glucose, Bld: 101 mg/dL — ABNORMAL HIGH (ref 65–99)
Potassium: 4.5 mmol/L (ref 3.5–5.3)
Sodium: 142 mmol/L (ref 135–146)
Total Bilirubin: 0.3 mg/dL (ref 0.2–1.2)
Total Protein: 6.6 g/dL (ref 6.1–8.1)

## 2019-03-16 LAB — LIPID PANEL
Cholesterol: 197 mg/dL (ref <200)
HDL: 45 mg/dL — ABNORMAL LOW (ref >=50)
LDL Cholesterol (Calc): 107 mg/dL (calc) — ABNORMAL HIGH
Non-HDL Cholesterol (Calc): 152 mg/dL (calc) — ABNORMAL HIGH (ref <130)
Total CHOL/HDL Ratio: 4.4 (calc) (ref <5.0)
Triglycerides: 336 mg/dL — ABNORMAL HIGH (ref <150)

## 2019-03-16 LAB — CBC WITH DIFFERENTIAL/PLATELET
Absolute Monocytes: 577 cells/uL (ref 200–950)
Basophils Absolute: 56 cells/uL (ref 0–200)
Basophils Relative: 0.6 %
Eosinophils Absolute: 298 cells/uL (ref 15–500)
Eosinophils Relative: 3.2 %
HCT: 43 % (ref 35.0–45.0)
Hemoglobin: 13.8 g/dL (ref 11.7–15.5)
Lymphs Abs: 2623 cells/uL (ref 850–3900)
MCH: 27.5 pg (ref 27.0–33.0)
MCHC: 32.1 g/dL (ref 32.0–36.0)
MCV: 85.8 fL (ref 80.0–100.0)
MPV: 9.7 fL (ref 7.5–12.5)
Monocytes Relative: 6.2 %
Neutro Abs: 5747 cells/uL (ref 1500–7800)
Neutrophils Relative %: 61.8 %
Platelets: 335 10*3/uL (ref 140–400)
RBC: 5.01 10*6/uL (ref 3.80–5.10)
RDW: 15.8 % — ABNORMAL HIGH (ref 11.0–15.0)
Total Lymphocyte: 28.2 %
WBC: 9.3 10*3/uL (ref 3.8–10.8)

## 2019-03-16 LAB — HEMOGLOBIN A1C
Hgb A1c MFr Bld: 5.9 % of total Hgb — ABNORMAL HIGH (ref <5.7)
Mean Plasma Glucose: 123 (calc)
eAG (mmol/L): 6.8 (calc)

## 2019-04-13 ENCOUNTER — Other Ambulatory Visit: Payer: Self-pay | Admitting: Family Medicine

## 2019-04-30 ENCOUNTER — Other Ambulatory Visit: Payer: Self-pay | Admitting: Family Medicine

## 2019-04-30 DIAGNOSIS — Z1231 Encounter for screening mammogram for malignant neoplasm of breast: Secondary | ICD-10-CM

## 2019-05-31 ENCOUNTER — Other Ambulatory Visit: Payer: Self-pay

## 2019-06-01 ENCOUNTER — Encounter: Payer: Self-pay | Admitting: Family Medicine

## 2019-06-01 ENCOUNTER — Ambulatory Visit (INDEPENDENT_AMBULATORY_CARE_PROVIDER_SITE_OTHER): Payer: Medicare Other | Admitting: Family Medicine

## 2019-06-01 DIAGNOSIS — R112 Nausea with vomiting, unspecified: Secondary | ICD-10-CM | POA: Diagnosis not present

## 2019-06-01 DIAGNOSIS — J40 Bronchitis, not specified as acute or chronic: Secondary | ICD-10-CM | POA: Diagnosis not present

## 2019-06-01 MED ORDER — AZITHROMYCIN 250 MG PO TABS: ORAL_TABLET | ORAL | 0 refills | Status: DC

## 2019-06-01 MED ORDER — ONDANSETRON HCL 4 MG PO TABS: 4.0000 mg | ORAL_TABLET | Freq: Three times a day (TID) | ORAL | 0 refills | Status: DC | PRN

## 2019-06-01 MED ORDER — BENZONATATE 100 MG PO CAPS: 100.0000 mg | ORAL_CAPSULE | Freq: Three times a day (TID) | ORAL | 0 refills | Status: DC | PRN

## 2019-06-01 NOTE — Progress Notes (Signed)
Virtual Visit via Telephone Note  I connected with Sherry Kent on 06/01/19 at 2:03pm by telephone and verified that I am speaking with the correct person using two identifiers.        Pt location: at home   Physician location:  In office, [facility], Vic Blackbird MD     On call: patient and physician   I discussed the limitations, risks, security and privacy concerns of performing an evaluation and management service by telephone and the availability of in person appointments. I also discussed with the patient that there may be a patient responsible charge related to this service. The patient expressed understanding and agreed to proceed.   History of Present Illness:  Pt called in with cough with production 4-5 days ago, feels congestion down in chest, unable to sleep, no fever, chills, body aches, no wheezing episodes, no sinus pressure or drainage  She had vomiting initially, and now has some diarrhea for past 3 days Decreased appetite, she is tolerating liquids, she tried toast but felt nausea afterwards so has not really been eating any solids  She does work at [employer] for part time  She has taken mucinex in past   Past 6 months    Observations/Objective: Able to speak in sentences , harsh cough OTC, NAD noted on phone  Assessment and Plan: Concern for bronchitis vs early bronchial PNA, but 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 The patient was provided an opportunity to ask questions and all were answered. The patient agreed with the plan and demonstrated an understanding of the instructions.   The patient was advised to call back or seek an in-person evaluation if the symptoms worsen or if the condition fails  to improve as anticipated.  I provided 8 minutes of non-face-to-face time during this encounter. End time: 2:11pm  Vic Blackbird, MD

## 2019-06-02 ENCOUNTER — Other Ambulatory Visit: Payer: Self-pay

## 2019-06-02 DIAGNOSIS — Z20822 Contact with and (suspected) exposure to covid-19: Secondary | ICD-10-CM

## 2019-06-03 LAB — NOVEL CORONAVIRUS, NAA: SARS-CoV-2, NAA: NOT DETECTED

## 2019-06-08 ENCOUNTER — Other Ambulatory Visit: Payer: Self-pay | Admitting: Family Medicine

## 2019-06-15 ENCOUNTER — Other Ambulatory Visit: Payer: Self-pay

## 2019-06-15 ENCOUNTER — Ambulatory Visit
Admission: RE | Admit: 2019-06-15 | Discharge: 2019-06-15 | Disposition: A | Discharge status: Home / Self Care | Payer: Medicare Other | Source: Ambulatory Visit | Attending: Family Medicine | Admitting: Family Medicine

## 2019-06-15 DIAGNOSIS — Z1231 Encounter for screening mammogram for malignant neoplasm of breast: Secondary | ICD-10-CM

## 2019-08-10 ENCOUNTER — Other Ambulatory Visit: Payer: Self-pay | Admitting: Family Medicine

## 2019-08-10 MED ORDER — MELOXICAM 7.5 MG PO TABS: ORAL_TABLET | ORAL | 3 refills | Status: DC

## 2019-09-06 ENCOUNTER — Other Ambulatory Visit: Payer: Self-pay | Admitting: Family Medicine

## 2019-09-06 DIAGNOSIS — I10 Essential (primary) hypertension: Secondary | ICD-10-CM

## 2019-09-15 ENCOUNTER — Inpatient Hospital Stay (HOSPITAL_COMMUNITY)
Admission: EM | Admit: 2019-09-15 | Discharge: 2019-09-21 | DRG: 871 | Disposition: A | Discharge status: Home / Self Care | Payer: Medicare PPO | Attending: Internal Medicine | Admitting: Internal Medicine

## 2019-09-15 ENCOUNTER — Inpatient Hospital Stay (HOSPITAL_COMMUNITY): Payer: Medicare PPO

## 2019-09-15 ENCOUNTER — Emergency Department (HOSPITAL_COMMUNITY): Payer: Medicare PPO

## 2019-09-15 DIAGNOSIS — Z888 Allergy status to other drugs, medicaments and biological substances status: Secondary | ICD-10-CM

## 2019-09-15 DIAGNOSIS — H109 Unspecified conjunctivitis: Secondary | ICD-10-CM | POA: Diagnosis present

## 2019-09-15 DIAGNOSIS — R6520 Severe sepsis without septic shock: Secondary | ICD-10-CM

## 2019-09-15 DIAGNOSIS — J9811 Atelectasis: Secondary | ICD-10-CM | POA: Diagnosis not present

## 2019-09-15 DIAGNOSIS — E861 Hypovolemia: Secondary | ICD-10-CM | POA: Diagnosis present

## 2019-09-15 DIAGNOSIS — F411 Generalized anxiety disorder: Secondary | ICD-10-CM | POA: Diagnosis present

## 2019-09-15 DIAGNOSIS — Z86010 Personal history of colonic polyps: Secondary | ICD-10-CM

## 2019-09-15 DIAGNOSIS — N179 Acute kidney failure, unspecified: Secondary | ICD-10-CM

## 2019-09-15 DIAGNOSIS — Z791 Long term (current) use of non-steroidal anti-inflammatories (NSAID): Secondary | ICD-10-CM

## 2019-09-15 DIAGNOSIS — R4182 Altered mental status, unspecified: Secondary | ICD-10-CM | POA: Diagnosis not present

## 2019-09-15 DIAGNOSIS — R739 Hyperglycemia, unspecified: Secondary | ICD-10-CM | POA: Diagnosis present

## 2019-09-15 DIAGNOSIS — R000 Tachycardia, unspecified: Secondary | ICD-10-CM | POA: Diagnosis not present

## 2019-09-15 DIAGNOSIS — Z87891 Personal history of nicotine dependence: Secondary | ICD-10-CM

## 2019-09-15 DIAGNOSIS — S6992XA Unspecified injury of left wrist, hand and finger(s), initial encounter: Secondary | ICD-10-CM | POA: Diagnosis not present

## 2019-09-15 DIAGNOSIS — G9341 Metabolic encephalopathy: Secondary | ICD-10-CM | POA: Diagnosis not present

## 2019-09-15 DIAGNOSIS — R52 Pain, unspecified: Secondary | ICD-10-CM

## 2019-09-15 DIAGNOSIS — Z800 Family history of malignant neoplasm of digestive organs: Secondary | ICD-10-CM

## 2019-09-15 DIAGNOSIS — J9600 Acute respiratory failure, unspecified whether with hypoxia or hypercapnia: Secondary | ICD-10-CM | POA: Diagnosis present

## 2019-09-15 DIAGNOSIS — Z7982 Long term (current) use of aspirin: Secondary | ICD-10-CM

## 2019-09-15 DIAGNOSIS — E785 Hyperlipidemia, unspecified: Secondary | ICD-10-CM | POA: Diagnosis present

## 2019-09-15 DIAGNOSIS — A419 Sepsis, unspecified organism: Principal | ICD-10-CM | POA: Diagnosis present

## 2019-09-15 DIAGNOSIS — R7303 Prediabetes: Secondary | ICD-10-CM | POA: Diagnosis present

## 2019-09-15 DIAGNOSIS — S299XXA Unspecified injury of thorax, initial encounter: Secondary | ICD-10-CM | POA: Diagnosis not present

## 2019-09-15 DIAGNOSIS — F419 Anxiety disorder, unspecified: Secondary | ICD-10-CM | POA: Diagnosis present

## 2019-09-15 DIAGNOSIS — M8580 Other specified disorders of bone density and structure, unspecified site: Secondary | ICD-10-CM | POA: Diagnosis present

## 2019-09-15 DIAGNOSIS — S3993XA Unspecified injury of pelvis, initial encounter: Secondary | ICD-10-CM | POA: Diagnosis not present

## 2019-09-15 DIAGNOSIS — Z20822 Contact with and (suspected) exposure to covid-19: Secondary | ICD-10-CM | POA: Diagnosis present

## 2019-09-15 DIAGNOSIS — N189 Chronic kidney disease, unspecified: Secondary | ICD-10-CM | POA: Diagnosis present

## 2019-09-15 DIAGNOSIS — E559 Vitamin D deficiency, unspecified: Secondary | ICD-10-CM | POA: Diagnosis present

## 2019-09-15 DIAGNOSIS — I6529 Occlusion and stenosis of unspecified carotid artery: Secondary | ICD-10-CM | POA: Diagnosis present

## 2019-09-15 DIAGNOSIS — E872 Acidosis: Secondary | ICD-10-CM | POA: Diagnosis present

## 2019-09-15 DIAGNOSIS — I129 Hypertensive chronic kidney disease with stage 1 through stage 4 chronic kidney disease, or unspecified chronic kidney disease: Secondary | ICD-10-CM | POA: Diagnosis present

## 2019-09-15 DIAGNOSIS — E876 Hypokalemia: Secondary | ICD-10-CM | POA: Diagnosis present

## 2019-09-15 DIAGNOSIS — J9601 Acute respiratory failure with hypoxia: Secondary | ICD-10-CM | POA: Diagnosis not present

## 2019-09-15 DIAGNOSIS — I10 Essential (primary) hypertension: Secondary | ICD-10-CM | POA: Diagnosis not present

## 2019-09-15 DIAGNOSIS — N3000 Acute cystitis without hematuria: Secondary | ICD-10-CM | POA: Diagnosis present

## 2019-09-15 DIAGNOSIS — Z781 Physical restraint status: Secondary | ICD-10-CM

## 2019-09-15 DIAGNOSIS — G9340 Encephalopathy, unspecified: Secondary | ICD-10-CM | POA: Diagnosis not present

## 2019-09-15 DIAGNOSIS — M6282 Rhabdomyolysis: Secondary | ICD-10-CM | POA: Diagnosis not present

## 2019-09-15 DIAGNOSIS — S0990XA Unspecified injury of head, initial encounter: Secondary | ICD-10-CM | POA: Diagnosis not present

## 2019-09-15 DIAGNOSIS — S199XXA Unspecified injury of neck, initial encounter: Secondary | ICD-10-CM | POA: Diagnosis not present

## 2019-09-15 DIAGNOSIS — I160 Hypertensive urgency: Secondary | ICD-10-CM | POA: Diagnosis present

## 2019-09-15 DIAGNOSIS — J988 Other specified respiratory disorders: Secondary | ICD-10-CM | POA: Diagnosis present

## 2019-09-15 DIAGNOSIS — R404 Transient alteration of awareness: Secondary | ICD-10-CM | POA: Diagnosis not present

## 2019-09-15 DIAGNOSIS — Z8673 Personal history of transient ischemic attack (TIA), and cerebral infarction without residual deficits: Secondary | ICD-10-CM

## 2019-09-15 DIAGNOSIS — G92 Toxic encephalopathy: Secondary | ICD-10-CM | POA: Diagnosis present

## 2019-09-15 DIAGNOSIS — R456 Violent behavior: Secondary | ICD-10-CM | POA: Diagnosis not present

## 2019-09-15 DIAGNOSIS — R55 Syncope and collapse: Secondary | ICD-10-CM | POA: Diagnosis not present

## 2019-09-15 DIAGNOSIS — Z9842 Cataract extraction status, left eye: Secondary | ICD-10-CM

## 2019-09-15 DIAGNOSIS — Z79899 Other long term (current) drug therapy: Secondary | ICD-10-CM | POA: Diagnosis not present

## 2019-09-15 DIAGNOSIS — Z4682 Encounter for fitting and adjustment of non-vascular catheter: Secondary | ICD-10-CM | POA: Diagnosis not present

## 2019-09-15 DIAGNOSIS — I358 Other nonrheumatic aortic valve disorders: Secondary | ICD-10-CM | POA: Diagnosis present

## 2019-09-15 DIAGNOSIS — Z9049 Acquired absence of other specified parts of digestive tract: Secondary | ICD-10-CM

## 2019-09-15 DIAGNOSIS — E1165 Type 2 diabetes mellitus with hyperglycemia: Secondary | ICD-10-CM | POA: Diagnosis not present

## 2019-09-15 DIAGNOSIS — Z9841 Cataract extraction status, right eye: Secondary | ICD-10-CM

## 2019-09-15 DIAGNOSIS — R102 Pelvic and perineal pain: Secondary | ICD-10-CM | POA: Diagnosis not present

## 2019-09-15 DIAGNOSIS — Z4659 Encounter for fitting and adjustment of other gastrointestinal appliance and device: Secondary | ICD-10-CM

## 2019-09-15 DIAGNOSIS — R0902 Hypoxemia: Secondary | ICD-10-CM | POA: Diagnosis not present

## 2019-09-15 LAB — POCT I-STAT 7, (LYTES, BLD GAS, ICA,H+H)
Acid-base deficit: 7 mmol/L — ABNORMAL HIGH (ref 0.0–2.0)
Acid-base deficit: 7 mmol/L — ABNORMAL HIGH (ref 0.0–2.0)
Acid-base deficit: 8 mmol/L — ABNORMAL HIGH (ref 0.0–2.0)
Bicarbonate: 18.9 mmol/L — ABNORMAL LOW (ref 20.0–28.0)
Bicarbonate: 18.5 mmol/L — ABNORMAL LOW (ref 20.0–28.0)
Bicarbonate: 17.7 mmol/L — ABNORMAL LOW (ref 20.0–28.0)
Calcium, Ion: 1.13 mmol/L — ABNORMAL LOW (ref 1.15–1.40)
Calcium, Ion: 1.15 mmol/L (ref 1.15–1.40)
Calcium, Ion: 1.14 mmol/L — ABNORMAL LOW (ref 1.15–1.40)
HCT: 33 % — ABNORMAL LOW (ref 36.0–46.0)
HCT: 35 % — ABNORMAL LOW (ref 36.0–46.0)
HCT: 31 % — ABNORMAL LOW (ref 36.0–46.0)
Hemoglobin: 11.2 g/dL — ABNORMAL LOW (ref 12.0–15.0)
Hemoglobin: 11.9 g/dL — ABNORMAL LOW (ref 12.0–15.0)
Hemoglobin: 10.5 g/dL — ABNORMAL LOW (ref 12.0–15.0)
O2 Saturation: 100 %
O2 Saturation: 98 %
O2 Saturation: 95 %
Patient temperature: 37.9
Patient temperature: 37.7
Potassium: 3.3 mmol/L — ABNORMAL LOW (ref 3.5–5.1)
Potassium: 3.6 mmol/L (ref 3.5–5.1)
Potassium: 3.4 mmol/L — ABNORMAL LOW (ref 3.5–5.1)
Sodium: 142 mmol/L (ref 135–145)
Sodium: 141 mmol/L (ref 135–145)
Sodium: 142 mmol/L (ref 135–145)
TCO2: 20 mmol/L — ABNORMAL LOW (ref 22–32)
TCO2: 20 mmol/L — ABNORMAL LOW (ref 22–32)
TCO2: 19 mmol/L — ABNORMAL LOW (ref 22–32)
pCO2 arterial: 38.1 mmHg (ref 32.0–48.0)
pCO2 arterial: 39.1 mmHg (ref 32.0–48.0)
pCO2 arterial: 35.1 mmHg (ref 32.0–48.0)
pH, Arterial: 7.303 — ABNORMAL LOW (ref 7.350–7.450)
pH, Arterial: 7.287 — ABNORMAL LOW (ref 7.350–7.450)
pH, Arterial: 7.313 — ABNORMAL LOW (ref 7.350–7.450)
pO2, Arterial: 287 mmHg — ABNORMAL HIGH (ref 83.0–108.0)
pO2, Arterial: 125 mmHg — ABNORMAL HIGH (ref 83.0–108.0)
pO2, Arterial: 86 mmHg (ref 83.0–108.0)

## 2019-09-15 LAB — CSF CELL COUNT WITH DIFFERENTIAL
Eosinophils, CSF: 0 % (ref 0–1)
Lymphs, CSF: 11 % — ABNORMAL LOW (ref 40–80)
Monocyte-Macrophage-Spinal Fluid: 9 % — ABNORMAL LOW (ref 15–45)
RBC Count, CSF: 335 /mm3 — ABNORMAL HIGH
Segmented Neutrophils-CSF: 80 % — ABNORMAL HIGH (ref 0–6)
Tube #: 4
WBC, CSF: 6 /mm3 — ABNORMAL HIGH (ref 0–5)

## 2019-09-15 LAB — URINALYSIS, ROUTINE W REFLEX MICROSCOPIC
Bilirubin Urine: NEGATIVE
Glucose, UA: >=500 mg/dL — AB
Ketones, ur: NEGATIVE mg/dL
Leukocytes,Ua: NEGATIVE
Nitrite: NEGATIVE
Protein, ur: >=300 mg/dL — AB
Specific Gravity, Urine: >1.046 — ABNORMAL HIGH (ref 1.005–1.030)
pH: 5 (ref 5.0–8.0)

## 2019-09-15 LAB — CBC WITH DIFFERENTIAL/PLATELET
Abs Immature Granulocytes: 0.3 10*3/uL — ABNORMAL HIGH (ref 0.00–0.07)
Basophils Absolute: 0 10*3/uL (ref 0.0–0.1)
Basophils Relative: 0 %
Eosinophils Absolute: 0 10*3/uL (ref 0.0–0.5)
Eosinophils Relative: 0 %
HCT: 45.1 % (ref 36.0–46.0)
Hemoglobin: 14.1 g/dL (ref 12.0–15.0)
Lymphocytes Relative: 5 %
Lymphs Abs: 1.6 10*3/uL (ref 0.7–4.0)
MCH: 26.8 pg (ref 26.0–34.0)
MCHC: 31.3 g/dL (ref 30.0–36.0)
MCV: 85.7 fL (ref 80.0–100.0)
Monocytes Absolute: 1.3 10*3/uL — ABNORMAL HIGH (ref 0.1–1.0)
Monocytes Relative: 4 %
Myelocytes: 1 %
Neutro Abs: 28.9 10*3/uL — ABNORMAL HIGH (ref 1.7–7.7)
Neutrophils Relative %: 90 %
Platelets: 470 10*3/uL — ABNORMAL HIGH (ref 150–400)
RBC: 5.26 MIL/uL — ABNORMAL HIGH (ref 3.87–5.11)
RDW: 15.3 % (ref 11.5–15.5)
WBC: 32.1 10*3/uL — ABNORMAL HIGH (ref 4.0–10.5)
nRBC: 0 % (ref 0.0–0.2)
nRBC: 0 /100 WBC

## 2019-09-15 LAB — BASIC METABOLIC PANEL
Anion gap: 10 (ref 5–15)
BUN: 21 mg/dL (ref 8–23)
CO2: 19 mmol/L — ABNORMAL LOW (ref 22–32)
Calcium: 7.5 mg/dL — ABNORMAL LOW (ref 8.9–10.3)
Chloride: 112 mmol/L — ABNORMAL HIGH (ref 98–111)
Creatinine, Ser: 1.68 mg/dL — ABNORMAL HIGH (ref 0.44–1.00)
GFR calc Af Amer: 34 mL/min — ABNORMAL LOW (ref >60)
GFR calc non Af Amer: 30 mL/min — ABNORMAL LOW (ref >60)
Glucose, Bld: 105 mg/dL — ABNORMAL HIGH (ref 70–99)
Potassium: 3.7 mmol/L (ref 3.5–5.1)
Sodium: 141 mmol/L (ref 135–145)

## 2019-09-15 LAB — COMPREHENSIVE METABOLIC PANEL
ALT: 20 U/L (ref 0–44)
AST: 35 U/L (ref 15–41)
Albumin: 4 g/dL (ref 3.5–5.0)
Alkaline Phosphatase: 88 U/L (ref 38–126)
Anion gap: 20 — ABNORMAL HIGH (ref 5–15)
BUN: 20 mg/dL (ref 8–23)
CO2: 17 mmol/L — ABNORMAL LOW (ref 22–32)
Calcium: 9.5 mg/dL (ref 8.9–10.3)
Chloride: 102 mmol/L (ref 98–111)
Creatinine, Ser: 1.74 mg/dL — ABNORMAL HIGH (ref 0.44–1.00)
GFR calc Af Amer: 33 mL/min — ABNORMAL LOW (ref >60)
GFR calc non Af Amer: 28 mL/min — ABNORMAL LOW (ref >60)
Glucose, Bld: 211 mg/dL — ABNORMAL HIGH (ref 70–99)
Potassium: 3.7 mmol/L (ref 3.5–5.1)
Sodium: 139 mmol/L (ref 135–145)
Total Bilirubin: 0.9 mg/dL (ref 0.3–1.2)
Total Protein: 7.7 g/dL (ref 6.5–8.1)

## 2019-09-15 LAB — GLUCOSE, CAPILLARY
Glucose-Capillary: 129 mg/dL — ABNORMAL HIGH (ref 70–99)
Glucose-Capillary: 95 mg/dL (ref 70–99)

## 2019-09-15 LAB — RAPID URINE DRUG SCREEN, HOSP PERFORMED
Amphetamines: NOT DETECTED
Barbiturates: NOT DETECTED
Benzodiazepines: NOT DETECTED
Cocaine: NOT DETECTED
Opiates: NOT DETECTED
Tetrahydrocannabinol: POSITIVE — AB

## 2019-09-15 LAB — LACTIC ACID, PLASMA
Lactic Acid, Venous: 6.5 mmol/L (ref 0.5–1.9)
Lactic Acid, Venous: 2.6 mmol/L (ref 0.5–1.9)
Lactic Acid, Venous: 1.9 mmol/L (ref 0.5–1.9)

## 2019-09-15 LAB — MRSA PCR SCREENING: MRSA by PCR: NEGATIVE

## 2019-09-15 LAB — RESPIRATORY PANEL BY RT PCR (FLU A&B, COVID)
Influenza A by PCR: NEGATIVE
Influenza B by PCR: NEGATIVE
SARS Coronavirus 2 by RT PCR: NEGATIVE

## 2019-09-15 LAB — COOXEMETRY PANEL
Carboxyhemoglobin: 0.7 % (ref 0.5–1.5)
Methemoglobin: 1.2 % (ref 0.0–1.5)
O2 Saturation: 65.6 %
Total hemoglobin: 11.8 g/dL — ABNORMAL LOW (ref 12.0–16.0)

## 2019-09-15 LAB — PROTIME-INR
INR: 1.1 (ref 0.8–1.2)
Prothrombin Time: 14.6 seconds (ref 11.4–15.2)

## 2019-09-15 LAB — TRIGLYCERIDES: Triglycerides: 283 mg/dL — ABNORMAL HIGH (ref <150)

## 2019-09-15 LAB — PROTEIN AND GLUCOSE, CSF
Glucose, CSF: 118 mg/dL — ABNORMAL HIGH (ref 40–70)
Total  Protein, CSF: 63 mg/dL — ABNORMAL HIGH (ref 15–45)

## 2019-09-15 LAB — CK: Total CK: 920 U/L — ABNORMAL HIGH (ref 38–234)

## 2019-09-15 LAB — HEMOGLOBIN A1C
Hgb A1c MFr Bld: 6 % — ABNORMAL HIGH (ref 4.8–5.6)
Mean Plasma Glucose: 125.5 mg/dL

## 2019-09-15 LAB — CBG MONITORING, ED: Glucose-Capillary: 188 mg/dL — ABNORMAL HIGH (ref 70–99)

## 2019-09-15 LAB — ETHANOL: Alcohol, Ethyl (B): <10 mg/dL (ref <10)

## 2019-09-15 LAB — ACETAMINOPHEN LEVEL: Acetaminophen (Tylenol), Serum: <10 ug/mL — ABNORMAL LOW (ref 10–30)

## 2019-09-15 MED ORDER — SODIUM CHLORIDE 0.9 % IV BOLUS (SEPSIS)
1000.0000 mL | Freq: Once | INTRAVENOUS | Status: AC
  Administered 2019-09-15: 1000 mL via INTRAVENOUS

## 2019-09-15 MED ORDER — SODIUM CHLORIDE 0.9 % IV BOLUS
1000.0000 mL | Freq: Once | INTRAVENOUS | Status: AC
  Administered 2019-09-15: 1000 mL via INTRAVENOUS

## 2019-09-15 MED ORDER — SODIUM CHLORIDE 0.9 % IV BOLUS: 500.0000 mL | Freq: Once | INTRAVENOUS | Status: DC

## 2019-09-15 MED ORDER — VANCOMYCIN HCL IN DEXTROSE 1-5 GM/200ML-% IV SOLN: 1000.0000 mg | INTRAVENOUS | Status: DC

## 2019-09-15 MED ORDER — VANCOMYCIN HCL 1750 MG/350ML IV SOLN
1750.0000 mg | Freq: Once | INTRAVENOUS | Status: AC
  Administered 2019-09-15: 1750 mg via INTRAVENOUS
  Filled 2019-09-15: qty 350

## 2019-09-15 MED ORDER — FENTANYL CITRATE (PF) 100 MCG/2ML IJ SOLN
INTRAMUSCULAR | Status: AC
  Administered 2019-09-15: 50 ug
  Filled 2019-09-15: qty 4

## 2019-09-15 MED ORDER — FENTANYL 2500MCG IN NS 250ML (10MCG/ML) PREMIX INFUSION
25.0000 ug/h | INTRAVENOUS | Status: DC
  Administered 2019-09-15: 70 ug/h via INTRAVENOUS
  Filled 2019-09-15: qty 250

## 2019-09-15 MED ORDER — SODIUM CHLORIDE 0.9 % IV SOLN
2.0000 g | Freq: Once | INTRAVENOUS | Status: AC
  Administered 2019-09-15: 2 g via INTRAVENOUS
  Filled 2019-09-15: qty 20

## 2019-09-15 MED ORDER — ETOMIDATE 2 MG/ML IV SOLN
INTRAVENOUS | Status: AC | PRN
  Administered 2019-09-15: 20 mg via INTRAVENOUS

## 2019-09-15 MED ORDER — LORAZEPAM 2 MG/ML IJ SOLN: 1.0000 mg | Freq: Once | INTRAMUSCULAR | Status: AC

## 2019-09-15 MED ORDER — PROPOFOL 1000 MG/100ML IV EMUL: 0.0000 ug/kg/min | INTRAVENOUS | Status: DC

## 2019-09-15 MED ORDER — ENOXAPARIN SODIUM 30 MG/0.3ML ~~LOC~~ SOLN
30.0000 mg | SUBCUTANEOUS | Status: DC
  Administered 2019-09-15: 30 mg via SUBCUTANEOUS
  Filled 2019-09-15 (×2): qty 0.3

## 2019-09-15 MED ORDER — FENTANYL CITRATE (PF) 100 MCG/2ML IJ SOLN
50.0000 ug | INTRAMUSCULAR | Status: DC | PRN
  Administered 2019-09-15: 50 ug via INTRAVENOUS

## 2019-09-15 MED ORDER — DEXTROSE 5 % IV SOLN
10.0000 mg/kg | Freq: Two times a day (BID) | INTRAVENOUS | Status: DC
  Administered 2019-09-15 – 2019-09-16 (×2): 770 mg via INTRAVENOUS
  Filled 2019-09-15 (×5): qty 15.4

## 2019-09-15 MED ORDER — MIDAZOLAM HCL 2 MG/2ML IJ SOLN
1.0000 mg | INTRAMUSCULAR | Status: DC | PRN
  Administered 2019-09-15 (×3): 1 mg via INTRAVENOUS
  Filled 2019-09-15 (×3): qty 2

## 2019-09-15 MED ORDER — FENTANYL CITRATE (PF) 100 MCG/2ML IJ SOLN: 50.0000 ug | INTRAMUSCULAR | Status: DC | PRN

## 2019-09-15 MED ORDER — SODIUM CHLORIDE 0.9 % IV SOLN
2.0000 g | Freq: Two times a day (BID) | INTRAVENOUS | Status: DC
  Administered 2019-09-16 – 2019-09-20 (×9): 2 g via INTRAVENOUS
  Filled 2019-09-15: qty 2
  Filled 2019-09-15: qty 20
  Filled 2019-09-15: qty 2
  Filled 2019-09-15: qty 20
  Filled 2019-09-15 (×2): qty 2
  Filled 2019-09-15: qty 20
  Filled 2019-09-15: qty 2
  Filled 2019-09-15: qty 20
  Filled 2019-09-15 (×2): qty 2
  Filled 2019-09-15: qty 20

## 2019-09-15 MED ORDER — LORAZEPAM 2 MG/ML IJ SOLN
INTRAMUSCULAR | Status: AC
  Filled 2019-09-15: qty 1

## 2019-09-15 MED ORDER — DOCUSATE SODIUM 50 MG/5ML PO LIQD
100.0000 mg | Freq: Two times a day (BID) | ORAL | Status: DC | PRN
  Filled 2019-09-15: qty 10

## 2019-09-15 MED ORDER — SODIUM CHLORIDE 0.9 % IV SOLN
INTRAVENOUS | Status: DC | PRN
  Administered 2019-09-15: 250 mL via INTRAVENOUS

## 2019-09-15 MED ORDER — INSULIN ASPART 100 UNIT/ML ~~LOC~~ SOLN
0.0000 [IU] | SUBCUTANEOUS | Status: DC
  Administered 2019-09-15: 2 [IU] via SUBCUTANEOUS

## 2019-09-15 MED ORDER — LACTATED RINGERS IV SOLN: INTRAVENOUS | Status: DC

## 2019-09-15 MED ORDER — FENTANYL 2500MCG IN NS 250ML (10MCG/ML) PREMIX INFUSION: 25.0000 ug/h | INTRAVENOUS | Status: DC

## 2019-09-15 MED ORDER — PANTOPRAZOLE SODIUM 40 MG PO PACK
40.0000 mg | PACK | Freq: Every day | ORAL | Status: DC
  Administered 2019-09-16: 40 mg
  Filled 2019-09-15: qty 20

## 2019-09-15 MED ORDER — PROPOFOL 1000 MG/100ML IV EMUL
0.0000 ug/kg/min | INTRAVENOUS | Status: DC
  Administered 2019-09-15: 30 ug/kg/min via INTRAVENOUS

## 2019-09-15 MED ORDER — FENTANYL BOLUS VIA INFUSION
25.0000 ug | INTRAVENOUS | Status: DC | PRN
  Filled 2019-09-15: qty 25

## 2019-09-15 MED ORDER — ACETAMINOPHEN 650 MG RE SUPP
650.0000 mg | Freq: Once | RECTAL | Status: AC
  Administered 2019-09-15: 650 mg via RECTAL
  Filled 2019-09-15: qty 1

## 2019-09-15 MED ORDER — SUCCINYLCHOLINE CHLORIDE 20 MG/ML IJ SOLN
INTRAMUSCULAR | Status: AC | PRN
  Administered 2019-09-15: 100 mg via INTRAVENOUS

## 2019-09-15 MED ORDER — ZIPRASIDONE MESYLATE 20 MG IM SOLR
10.0000 mg | Freq: Once | INTRAMUSCULAR | Status: AC
  Administered 2019-09-15: 10 mg via INTRAMUSCULAR
  Filled 2019-09-15: qty 20

## 2019-09-15 MED ORDER — SODIUM CHLORIDE 0.9 % IV SOLN
2.0000 g | Freq: Four times a day (QID) | INTRAVENOUS | Status: DC
  Administered 2019-09-15 – 2019-09-16 (×3): 2 g via INTRAVENOUS
  Filled 2019-09-15 (×5): qty 2000

## 2019-09-15 MED ORDER — DEXMEDETOMIDINE HCL IN NACL 400 MCG/100ML IV SOLN
0.4000 ug/kg/h | INTRAVENOUS | Status: DC
  Administered 2019-09-15: 0.2 ug/kg/h via INTRAVENOUS
  Filled 2019-09-15: qty 100

## 2019-09-15 MED ORDER — SODIUM CHLORIDE 0.9 % IV SOLN
2.0000 g | INTRAVENOUS | Status: DC
  Filled 2019-09-15 (×4): qty 2000

## 2019-09-15 MED ORDER — FENTANYL CITRATE (PF) 100 MCG/2ML IJ SOLN
25.0000 ug | Freq: Once | INTRAMUSCULAR | Status: AC
  Administered 2019-09-15: 25 ug via INTRAVENOUS

## 2019-09-15 MED ORDER — FENTANYL BOLUS VIA INFUSION
25.0000 ug | INTRAVENOUS | Status: DC | PRN
  Administered 2019-09-15 – 2019-09-16 (×2): 25 ug via INTRAVENOUS
  Filled 2019-09-15: qty 25

## 2019-09-15 MED ORDER — LORAZEPAM 2 MG/ML IJ SOLN
1.0000 mg | Freq: Once | INTRAMUSCULAR | Status: AC
  Administered 2019-09-15: 1 mg via INTRAVENOUS
  Filled 2019-09-15: qty 1

## 2019-09-15 NOTE — Consult Note (Signed)
Neurology Consultation  Reason for Consult: Encephalopathy Referring Physician: Dr. Nelda Marseille  CC: Altered mental status  History is obtained from: Chart review  HPI: Sherry Kent is a 74 y.o. female was a past medical history of hypertension, hyperlipidemia, anxiety, last normal 4 PM yesterday brought into the ER after she was found down in her home and was checked on for not showing up for work and subsequently noted on a welfare check presumably. She was very agitated,, desaturating and not being able to maintain her airway and had to be intubated for airway protection. On labs, she had an elevated lactate-6.5.  Creatinine elevated at 1.74.  White count 32.1.  Blood sugars in the 200s. Covid test was negative.  Chest x-ray showed clear lungs.  Urinalysis with negative leuk esterase and nitrites but many bacteria and 0-5 WBCs. Broad differential for the encephalopathy-likely toxic metabolic. Urine toxicology screen in the ER positive for THC. Because of no clear or evident source of infection explained the clinical picture, a spinal tap was performed.  Glucose 118 total protein 63.  Rest of the results are pending. T-max 100.9.  Systolic blood pressures anywhere from high 70s to 180s. CT head unremarkable  Per chart review, family had reported that she was treated for a likely UTI after noting some confusion approximately 1 week ago.  Unclear what antibiotic was used.  LKW: 4 PM on 09/14/2019 tpa given?: no, outside the window Premorbid modified Rankin scale (mRS): Presumably 0-no collateral available  ROS:  Unable to obtain due to altered mental status.   Past Medical History:  Diagnosis Date  . Anxiety   . H/O hematuria   . History of colon polyps   . Hyperlipidemia   . Hypertension      Family History  Problem Relation Age of Onset  . Colon cancer Sister     Social History:   reports that she quit smoking about 15 years ago. Her smoking use included cigarettes. She has a  30.00 pack-year smoking history. She has never used smokeless tobacco. She reports current alcohol use. She reports that she does not use drugs.   Medications  Current Facility-Administered Medications:  .  acyclovir (ZOVIRAX) 770 mg in dextrose 5 % 150 mL IVPB, 10 mg/kg, Intravenous, Q12H, Duanne Limerick, RPH .  ampicillin (OMNIPEN) 2 g in sodium chloride 0.9 % 100 mL IVPB, 2 g, Intravenous, Q6H, Rush Farmer, MD, Last Rate: 300 mL/hr at 09/15/19 1904, 2 g at 09/15/19 1904 .  [START ON 09/16/2019] cefTRIAXone (ROCEPHIN) 2 g in sodium chloride 0.9 % 100 mL IVPB, 2 g, Intravenous, Q12H, Duanne Limerick, RPH .  docusate (COLACE) 50 MG/5ML liquid 100 mg, 100 mg, Per Tube, BID PRN, Kathi Ludwig, MD .  enoxaparin (LOVENOX) injection 30 mg, 30 mg, Subcutaneous, Q24H, Harbrecht, Lawrence, MD .  fentaNYL (SUBLIMAZE) bolus via infusion 25 mcg, 25 mcg, Intravenous, Q15 min PRN, Harbrecht, Lawrence, MD .  fentaNYL 2569mcg in NS 234mL (45mcg/ml) infusion-PREMIX, 25-200 mcg/hr, Intravenous, Continuous, Harbrecht, Lawrence, MD, Last Rate: 12.5 mL/hr at 09/15/19 1850, 125 mcg/hr at 09/15/19 1850 .  insulin aspart (novoLOG) injection 0-24 Units, 0-24 Units, Subcutaneous, Q4H, Kathi Ludwig, MD .  lactated ringers infusion, , Intravenous, Continuous, Kathi Ludwig, MD, Last Rate: 75 mL/hr at 09/15/19 1744, New Bag at 09/15/19 1744 .  midazolam (VERSED) injection 1 mg, 1 mg, Intravenous, Q2H PRN, Kathi Ludwig, MD .  Derrill Memo ON 09/16/2019] pantoprazole sodium (PROTONIX) 40 mg/20 mL oral suspension 40 mg, 40 mg, Per  Tube, Daily, Kathi Ludwig, MD .  vancomycin (VANCOREADY) IVPB 1750 mg/350 mL, 1,750 mg, Intravenous, Once **FOLLOWED BY** [START ON 09/16/2019] vancomycin (VANCOCIN) IVPB 1000 mg/200 mL premix, 1,000 mg, Intravenous, Q24H, Duanne Limerick, [facility]  Current Outpatient Medications:  .  aspirin EC 81 MG tablet, Take 81 mg by mouth daily., Disp: , Rfl:  .  Calcium Carbonate-Vit D-Min  (CALCIUM 1200 PO), Take by mouth. In divided doses, Disp: , Rfl:  .  Cholecalciferol (SM VITAMIN D3) 4000 UNITS CAPS, Take 1 capsule (4,000 Units total) by mouth daily., Disp: 30 capsule, Rfl: 11 .  Ibuprofen-diphenhydrAMINE HCl (ADVIL PM) 200-25 MG CAPS, Take 1 tablet by mouth daily as needed (For pain)., Disp: , Rfl:  .  lisinopril (ZESTRIL) 40 MG tablet, TAKE 1 TABLET(40 MG) BY MOUTH DAILY (Patient taking differently: Take 40 mg by mouth daily. ), Disp: 90 tablet, Rfl: 1 .  meloxicam (MOBIC) 7.5 MG tablet, TAKE 1 TABLET(7.5 MG) BY MOUTH DAILY (Patient taking differently: Take 7.5 mg by mouth daily. ), Disp: 30 tablet, Rfl: 3 .  nebivolol (BYSTOLIC) 5 MG tablet, TAKE 1 TABLET(5 MG) BY MOUTH DAILY (Patient taking differently: Take 5 mg by mouth daily. ), Disp: 90 tablet, Rfl: 1 .  omeprazole (PRILOSEC) 20 MG capsule, TAKE 1 CAPSULE(20 MG) BY MOUTH DAILY (Patient taking differently: Take 20 mg by mouth daily. ), Disp: 30 capsule, Rfl: 3 .  ondansetron (ZOFRAN) 4 MG tablet, Take 1 tablet (4 mg total) by mouth every 8 (eight) hours as needed for nausea or vomiting., Disp: 20 tablet, Rfl: 0 .  rosuvastatin (CRESTOR) 20 MG tablet, TAKE 1 TABLET(20 MG) BY MOUTH DAILY (Patient taking differently: Take 20 mg by mouth daily. ), Disp: 90 tablet, Rfl: 1 .  sertraline (ZOLOFT) 50 MG tablet, TAKE 1 TABLET(50 MG) BY MOUTH DAILY (Patient taking differently: Take 50 mg by mouth daily. ), Disp: 90 tablet, Rfl: 1 .  benzonatate (TESSALON) 100 MG capsule, Take 1 capsule (100 mg total) by mouth 3 (three) times daily as needed for cough., Disp: 30 capsule, Rfl: 0   Exam: Current vital signs: BP (!) 176/99   Pulse (!) 113   Temp (!) 100.5 F (38.1 C)   Resp 14   Ht 5\' 6"  (1.676 m)   Wt 77 kg   SpO2 98%   BMI 27.40 kg/m  Vital signs in last 24 hours: Temp:  [99.7 F (37.6 C)-101.1 F (38.4 C)] 100.5 F (38.1 C) (01/13 1815) Pulse Rate:  [71-154] 113 (01/13 1930) Resp:  [10-30] 14 (01/13 1930) BP:  (57-176)/(38-99) 176/99 (01/13 1930) SpO2:  [88 %-100 %] 98 % (01/13 1930) FiO2 (%):  [45 %-100 %] 45 % (01/13 1930) Weight:  [77 kg] 77 kg (01/13 1632) General: Sedated intubated HEENT: Normocephalic atraumatic Lungs: Clear to auscultation Cardiovascular regular rate rhythm Abdomen: Soft nondistended nontender Extremities warm well perfused with intact pulses and no edema Neurological exam Mental status: Sedated intubated Speech and language: Due to sedation intubation cannot be assessed.  Does not follow any commands.  Does not open eyes to voice or noxious stimulation. Cranial nerves: Pupils are equal round reactive to light, there is no gaze preference or deviation, does not blink to threat from either side, oculocephalics are present, facial symmetry difficult to ascertain due to the endotracheal tube. Motor exam: Spontaneously moves all 4 extremities and withdraws all 4 forcefully to noxious stimulation. Sensory exam: As above Cannot test coordination and gait due to current mentation NIHSS 1a Level of Conscious.:  3 1b LOC Questions: 2 1c LOC Commands: 2 2 Best Gaze: 0 3 Visual: 0 4 Facial Palsy: 0 5a Motor Arm - left: 2 5b Motor Arm - Right: 2 6a Motor Leg - Left: 2 6b Motor Leg - Right: 2 7 Limb Ataxia: 0 8 Sensory: 0 9 Best Language: 3 10 Dysarthria: un 11 Extinct. and Inatten.: 0 TOTAL: 18 UN  Labs I have reviewed labs in epic and the results pertinent to this consultation are: CBC    Component Value Date/Time   WBC 32.1 (H) 09/15/2019 1322   RBC 5.26 (H) 09/15/2019 1322   HGB 11.2 (L) 09/15/2019 1620   HCT 33.0 (L) 09/15/2019 1620   PLT 470 (H) 09/15/2019 1322   MCV 85.7 09/15/2019 1322   MCH 26.8 09/15/2019 1322   MCHC 31.3 09/15/2019 1322   RDW 15.3 09/15/2019 1322   LYMPHSABS 1.6 09/15/2019 1322   MONOABS 1.3 (H) 09/15/2019 1322   EOSABS 0.0 09/15/2019 1322   BASOSABS 0.0 09/15/2019 1322  CMP     Component Value Date/Time   NA 142 09/15/2019 1620    K 3.3 (L) 09/15/2019 1620   CL 102 09/15/2019 1322   CO2 17 (L) 09/15/2019 1322   GLUCOSE 211 (H) 09/15/2019 1322   BUN 20 09/15/2019 1322   CREATININE 1.74 (H) 09/15/2019 1322   CREATININE 1.10 (H) 03/15/2019 0829   CALCIUM 9.5 09/15/2019 1322   PROT 7.7 09/15/2019 1322   ALBUMIN 4.0 09/15/2019 1322   AST 35 09/15/2019 1322   ALT 20 09/15/2019 1322   ALKPHOS 88 09/15/2019 1322   BILITOT 0.9 09/15/2019 1322   GFRNONAA 28 (L) 09/15/2019 1322   GFRNONAA 54 (L) 07/29/2016 1256   GFRAA 33 (L) 09/15/2019 1322   GFRAA 62 07/29/2016 1256  Lipid Panel     Component Value Date/Time   CHOL 197 03/15/2019 0829   TRIG 283 (H) 09/15/2019 1632   HDL 45 (L) 03/15/2019 0829   CHOLHDL 4.4 03/15/2019 0829   VLDL 47 (H) 06/05/2015 1022   LDLCALC 107 (H) 03/15/2019 0829   Imaging I have reviewed the images obtained: CT-scan of the brain-no acute changes. CT C-spine-negative for acute cervical spine fracture.  Assessment: This is a 74 year old woman who has been admitted to the intensive care unit for evaluation of altered mental status. Her last known normal was over 24 hours from now. Found down after being checked on for not showing up to work. Leukocytosis of 32,000, AKI on CKD, negative chest x-ray, negative urinalysis. LP performed-initial results pending-glucose and protein available.  Protein mildly elevated. Does not have meningismus on exam. It is reasonable to treat for meningeoencephalitis given absence of any other source to explain the current findings. Seizures could also be considered in differential, but do not usually give a white count up to 30,000 -stat EEG will be performed nonetheless.   Impression: Multifactorial toxic metabolic encephalopathy-specific etiology under investigation  Recommendations: Await LP results Agree with bacterial and viral coverage for meningitis/encephalitis. Supportive care per primary team Stat EEG We will update if EEG concerning for  seizures. Further recommendations based on clinical course and test results being available.  -- Amie Portland, MD [facility] Pager: [phone] If 7pm to 7am, please call on call as listed on AMION.  CRITICAL CARE ATTESTATION Performed by: Amie Portland, MD Total critical care time: 35 minutes Critical care time was exclusive of separately billable procedures and treating other patients and/or supervising APPs/Residents/Students Critical care was necessary to treat or  prevent imminent or life-threatening deterioration due to toxic metabolic encephalopathy. This patient is critically ill and at significant risk for neurological worsening and/or death and care requires constant monitoring. Critical care was time spent personally by me on the following activities: development of treatment plan with patient and/or surrogate as well as nursing, discussions with consultants, evaluation of patient's response to treatment, examination of patient, obtaining history from patient or surrogate, ordering and performing treatments and interventions, ordering and review of laboratory studies, ordering and review of radiographic studies, pulse oximetry, re-evaluation of patient's condition, participation in multidisciplinary rounds and medical decision making of high complexity in the care of this patient.   Addendum -EEG suggestive of encephalopathy-no seizures. -CSF results-RBC 3250, WBC 8, CSF protein 63, glucose 118. -Continue meningitic and encephalitic coverage.  Await HSV PCR before discontinuing acyclovir. -No need for LTM EEG -Neurology will continue to follow  -- Amie Portland, MD [facility] Pager: [phone] If 7pm to 7am, please call on call as listed on AMION.

## 2019-09-15 NOTE — ED Provider Notes (Signed)
[facility] EMERGENCY DEPARTMENT Provider Note   CSN: [id number] Arrival date & time: 09/15/19  1126     History Chief Complaint  Patient presents with  . Altered Mental Status    Sherry Kent is a 74 y.o. female.  Pt presents to the ED today with AMS.  The pt is still working at [employer].  She did not show up for work this am, so they sent someone to check on her.  The pt lives alone.  She was found in the shower.  The pt was LSN yesterday at 1600.  The pt is unable to give any hx.  She is fighting with EMS and is agitated.  Pt's family said she was confused last week and was treated for a UTI and seemed to get better.  They are unsure which abx she took.        Past Medical History:  Diagnosis Date  . Anxiety   . H/O hematuria   . History of colon polyps   . Hyperlipidemia   . Hypertension     Patient Active Problem List   Diagnosis Date Noted  . Chronic kidney disease 02/15/2016  . Carotid artery stenosis 02/14/2016  . Vasovagal syncope 06/05/2015  . Osteopenia 06/05/2015  . Postmenopausal 06/05/2015  . Estrogen deficiency 06/05/2015  . Breast cancer screening 06/05/2015  . Depression 05/27/2013  . Glucose intolerance (impaired glucose tolerance) 05/27/2013  . Hypertension   . Hyperlipidemia   . Panic attacks   . History of colon polyps   . H/O hematuria   . TIA (transient ischemic attack)   . Vitamin D deficiency     Past Surgical History:  Procedure Laterality Date  . APPENDECTOMY  8/07  . DILATION AND CURETTAGE OF UTERUS    . EYE SURGERY     bilat cataracts  . TONSILLECTOMY       OB History   No obstetric history on file.     Family History  Problem Relation Age of Onset  . Colon cancer Sister     Social History   Tobacco Use  . Smoking status: Former Smoker    Packs/day: 1.00    Years: 30.00    Pack years: 30.00    Types: Cigarettes    Quit date: 10/27/2003    Years since quitting: 15.8  . Smokeless tobacco: Never  Used  Substance Use Topics  . Alcohol use: Yes    Alcohol/week: 0.0 standard drinks    Comment: socially  . Drug use: No    Home Medications Prior to Admission medications   Medication Sig Start Date End Date Taking? Authorizing Provider  aspirin EC 81 MG tablet Take 81 mg by mouth daily.    [provider]  azithromycin (ZITHROMAX) 250 MG tablet Take 2 tablets x 1 day, then 1 tab daily for 4 days 06/01/19   Alycia Rossetti, MD  benzonatate (TESSALON) 100 MG capsule Take 1 capsule (100 mg total) by mouth 3 (three) times daily as needed for cough. 06/01/19   Alycia Rossetti, MD  Calcium Carbonate-Vit D-Min (CALCIUM 1200 PO) Take by mouth. In divided doses    [provider]  Cholecalciferol (SM VITAMIN D3) 4000 UNITS CAPS Take 1 capsule (4,000 Units total) by mouth daily. Patient not taking: Reported on 03/15/2019 06/29/15   Orlena Sheldon, PA-C  cyanocobalamin 1000 MCG tablet Take 100 mcg by mouth daily.    [provider]  lisinopril (ZESTRIL) 40 MG tablet TAKE  1 TABLET(40 MG) BY MOUTH DAILY 09/06/19   Alycia Rossetti, MD  meloxicam (MOBIC) 7.5 MG tablet TAKE 1 TABLET(7.5 MG) BY MOUTH DAILY 08/10/19   [place], Modena Nunnery, MD  nebivolol (BYSTOLIC) 5 MG tablet TAKE 1 TABLET(5 MG) BY MOUTH DAILY 09/06/19   Alycia Rossetti, MD  omeprazole (PRILOSEC) 20 MG capsule TAKE 1 CAPSULE(20 MG) BY MOUTH DAILY 09/06/19   [place], Modena Nunnery, MD  ondansetron (ZOFRAN) 4 MG tablet Take 1 tablet (4 mg total) by mouth every 8 (eight) hours as needed for nausea or vomiting. 06/01/19   Alycia Rossetti, MD  rosuvastatin (CRESTOR) 20 MG tablet TAKE 1 TABLET(20 MG) BY MOUTH DAILY 09/06/19   Alycia Rossetti, MD  sertraline (ZOLOFT) 50 MG tablet TAKE 1 TABLET(50 MG) BY MOUTH DAILY 09/06/19   [place], Modena Nunnery, MD    Allergies    Norvasc [amlodipine besylate]  Review of Systems   Review of Systems  Unable to perform ROS: Mental status change    Physical Exam Updated Vital Signs BP 135/73  (BP Location: Left Arm)   Pulse 85   Temp (!) 100.9 F (38.3 C) (Bladder)   Resp 17   Ht 5\' 6"  (1.676 m)   Wt 77 kg   SpO2 100%   BMI 27.40 kg/m   Physical Exam Vitals and nursing note reviewed.  Constitutional:      General: She is in acute distress.     Comments: Pt is very agitated, but is moving all 4 extremities  HENT:     Head: Normocephalic and atraumatic.     Right Ear: External ear normal.     Left Ear: External ear normal.     Nose: Nose normal.     Mouth/Throat:     Mouth: Mucous membranes are moist.     Pharynx: Oropharynx is clear.  Eyes:     Extraocular Movements: Extraocular movements intact.     Conjunctiva/sclera: Conjunctivae normal.     Pupils: Pupils are equal, round, and reactive to light.  Neck:     Comments: In c-collar Cardiovascular:     Rate and Rhythm: Regular rhythm. Tachycardia present.     Pulses: Normal pulses.     Heart sounds: Normal heart sounds.  Pulmonary:     Effort: Pulmonary effort is normal.     Breath sounds: Normal breath sounds.  Abdominal:     General: Abdomen is flat. Bowel sounds are normal.     Palpations: Abdomen is soft.  Musculoskeletal:        General: Normal range of motion.  Skin:    General: Skin is warm.     Capillary Refill: Capillary refill takes less than 2 seconds.  Neurological:     Mental Status: She is disoriented.     Comments: Pt is moving all 4 extremities.  She is not speaking.  She is not following any commands.    Psychiatric:        Behavior: Behavior is agitated and aggressive.     ED Results / Procedures / Treatments   Labs (all labs ordered are listed, but only abnormal results are displayed) Labs Reviewed  CBC WITH DIFFERENTIAL/PLATELET - Abnormal; Notable for the following components:      Result Value   WBC 32.1 (*)    RBC 5.26 (*)    Platelets 470 (*)    Neutro Abs 28.9 (*)    Monocytes Absolute 1.3 (*)    Abs Immature Granulocytes 0.30 (*)  All other components within normal  limits  COMPREHENSIVE METABOLIC PANEL - Abnormal; Notable for the following components:   CO2 17 (*)    Glucose, Bld 211 (*)    Creatinine, Ser 1.74 (*)    GFR calc non Af Amer 28 (*)    GFR calc Af Amer 33 (*)    Anion gap 20 (*)    All other components within normal limits  URINALYSIS, ROUTINE W REFLEX MICROSCOPIC - Abnormal; Notable for the following components:   Color, Urine AMBER (*)    APPearance CLOUDY (*)    Specific Gravity, Urine >1.046 (*)    Glucose, UA >=500 (*)    Hgb urine dipstick SMALL (*)    Protein, ur >=300 (*)    Bacteria, UA MANY (*)    All other components within normal limits  LACTIC ACID, PLASMA - Abnormal; Notable for the following components:   Lactic Acid, Venous 6.5 (*)    All other components within normal limits  CBG MONITORING, ED - Abnormal; Notable for the following components:   Glucose-Capillary 188 (*)    All other components within normal limits  POCT I-STAT 7, (LYTES, BLD GAS, ICA,H+H) - Abnormal; Notable for the following components:   pH, Arterial 7.303 (*)    pO2, Arterial 287.0 (*)    Bicarbonate 18.9 (*)    TCO2 20 (*)    Acid-base deficit 7.0 (*)    Potassium 3.3 (*)    Calcium, Ion 1.13 (*)    HCT 33.0 (*)    Hemoglobin 11.2 (*)    All other components within normal limits  RESPIRATORY PANEL BY RT PCR (FLU A&B, COVID)  CULTURE, BLOOD (ROUTINE X 2)  CULTURE, BLOOD (ROUTINE X 2)  URINE CULTURE  LACTIC ACID, PLASMA  TRIGLYCERIDES  PROTIME-INR    EKG EKG Interpretation  Date/Time:  Wednesday September 15 2019 14:20:25 EST Ventricular Rate:  90 PR Interval:    QRS Duration: 76 QT Interval:  407 QTC Calculation: 498 R Axis:   59 Text Interpretation: Sinus rhythm Abnormal R-wave progression, early transition Minimal ST depression, anterolateral leads Borderline prolonged QT interval No significant change since last tracing Confirmed by Isla Pence [phone]) on 09/15/2019 2:47:45 PM   Radiology DG Wrist 2 Views  Left  Result Date: 09/15/2019 CLINICAL DATA:  Found down in shower, confused, disoriented EXAM: LEFT WRIST - 2 VIEW COMPARISON:  None. FINDINGS: No fracture or dislocation of the left wrist. The carpus is normally aligned. Mild arthrosis. Soft tissues are unremarkable. IMPRESSION: No fracture or dislocation of the left wrist. The carpus is normally aligned. Electronically Signed   By: Eddie Candle M.D.   On: 09/15/2019 14:32   CT Head Wo Contrast  Result Date: 09/15/2019 CLINICAL DATA:  Found down in the shower.  Altered mental status. EXAM: CT HEAD WITHOUT CONTRAST CT CERVICAL SPINE WITHOUT CONTRAST TECHNIQUE: Multidetector CT imaging of the head and cervical spine was performed following the standard protocol without intravenous contrast. Multiplanar CT image reconstructions of the cervical spine were also generated. COMPARISON:  MR brain and CT head dated December 11, 2011. FINDINGS: CT HEAD FINDINGS Brain: No evidence of acute infarction, hemorrhage, hydrocephalus, extra-axial collection or mass lesion/mass effect. Stable mild chronic microvascular ischemic changes. Vascular: No hyperdense vessel or unexpected calcification. Skull: Normal. Negative for fracture or focal lesion. Sinuses/Orbits: Pansinus mucosal thickening. Air-fluid level in the right maxillary sinus. The mastoid air cells are clear. The orbits are unremarkable. Other: None. CT CERVICAL SPINE FINDINGS Alignment: Normal.  Skull base and vertebrae: No acute fracture. No primary bone lesion or focal pathologic process. 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 Soft tissues and spinal canal: No prevertebral fluid or swelling. No visible canal hematoma. Disc levels: Severe disc height loss and advanced uncovertebral hypertrophy from C4-C5 through C6-C7. Upper chest: Mild emphysema. Other: None. IMPRESSION: 1.  No acute intracranial abnormality. 2.  No acute cervical spine fracture. Electronically Signed   By: Titus Dubin M.D.   On:  09/15/2019 13:12   CT Cervical Spine Wo Contrast  Result Date: 09/15/2019 CLINICAL DATA:  Found down in the shower.  Altered mental status. EXAM: CT HEAD WITHOUT CONTRAST CT CERVICAL SPINE WITHOUT CONTRAST TECHNIQUE: Multidetector CT imaging of the head and cervical spine was performed following the standard protocol without intravenous contrast. Multiplanar CT image reconstructions of the cervical spine were also generated. COMPARISON:  MR brain and CT head dated December 11, 2011. FINDINGS: CT HEAD FINDINGS Brain: No evidence of acute infarction, hemorrhage, hydrocephalus, extra-axial collection or mass lesion/mass effect. Stable mild chronic microvascular ischemic changes. Vascular: No hyperdense vessel or unexpected calcification. Skull: Normal. Negative for fracture or focal lesion. Sinuses/Orbits: Pansinus mucosal thickening. Air-fluid level in the right maxillary sinus. The mastoid air cells are clear. The orbits are unremarkable. Other: None. CT CERVICAL SPINE FINDINGS Alignment: Normal. Skull base and vertebrae: No acute fracture. No primary bone lesion or focal pathologic process. 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 Soft tissues and spinal canal: No prevertebral fluid or swelling. No visible canal hematoma. Disc levels: Severe disc height loss and advanced uncovertebral hypertrophy from C4-C5 through C6-C7. Upper chest: Mild emphysema. Other: None. IMPRESSION: 1.  No acute intracranial abnormality. 2.  No acute cervical spine fracture. Electronically Signed   By: Titus Dubin M.D.   On: 09/15/2019 13:12   DG Pelvis Portable  Result Date: 09/15/2019 CLINICAL DATA:  Pain. The patient was found down. EXAM: PORTABLE PELVIS 1-2 VIEWS COMPARISON:  None. FINDINGS: There is no evidence of pelvic fracture or diastasis. No pelvic bone lesions are seen. Degenerative changes in the lower lumbar spine. IMPRESSION: Normal appearing pelvis. Degenerative changes in the lower lumbar spine.  Electronically Signed   By: Lorriane Shire M.D.   On: 09/15/2019 14:34   DG Chest Portable 1 View  Result Date: 09/15/2019 CLINICAL DATA:  Altered level of consciousness. Patient was found down. EXAM: PORTABLE CHEST 1 VIEW COMPARISON:  04/08/2006 FINDINGS: Endotracheal tube in good position 3 cm above the carina. NG tube tip below the diaphragm. Heart size and pulmonary vascularity are normal. Lungs are clear. No bone abnormality. IMPRESSION: Endotracheal tube in good position. Lungs are clear. Electronically Signed   By: Lorriane Shire M.D.   On: 09/15/2019 14:33    Procedures Procedure Name: Intubation Date/Time: 09/15/2019 2:45 PM Performed by: Isla Pence, MD Pre-anesthesia Checklist: Patient identified, Patient being monitored, Emergency Drugs available, Timeout performed and Suction available Oxygen Delivery Method: Non-rebreather mask Preoxygenation: Pre-oxygenation with 100% oxygen Induction Type: Rapid sequence Ventilation: Mask ventilation without difficulty Laryngoscope Size: Glidescope and 3 Tube size: 7.0 mm Number of attempts: 1 Placement Confirmation: ETT inserted through vocal cords under direct vision,  CO2 detector and Breath sounds checked- equal and bilateral Secured at: 24 cm Tube secured with: ETT holder Dental Injury: Teeth and Oropharynx as per pre-operative assessment       (including critical care time)  Medications Ordered in ED Medications  propofol (DIPRIVAN) 1000 MG/100ML infusion (15 mcg/kg/min  77 kg Intravenous  Rate/Dose Change 09/15/19 1635)  fentaNYL (SUBLIMAZE) injection 50 mcg (50 mcg Intravenous Given 09/15/19 1505)  fentaNYL (SUBLIMAZE) injection 50 mcg (has no administration in time range)  cefTRIAXone (ROCEPHIN) 2 g in sodium chloride 0.9 % 100 mL IVPB (2 g Intravenous New Bag/Given 09/15/19 1631)  fentaNYL 2540mcg in NS 245mL (27mcg/ml) infusion-PREMIX (70 mcg/hr Intravenous New Bag/Given 09/15/19 1559)  fentaNYL (SUBLIMAZE) bolus via  infusion 25 mcg (has no administration in time range)  LORazepam (ATIVAN) injection 1 mg (1 mg Intravenous Given 09/15/19 1145)  ziprasidone (GEODON) injection 10 mg (10 mg Intramuscular Given 09/15/19 1154)  LORazepam (ATIVAN) injection 1 mg ( Intravenous Given 09/15/19 1218)  ziprasidone (GEODON) injection 10 mg (10 mg Intramuscular Given 09/15/19 1332)  fentaNYL (SUBLIMAZE) 100 MCG/2ML injection (50 mcg  Given 09/15/19 1447)  acetaminophen (TYLENOL) suppository 650 mg (650 mg Rectal Given 09/15/19 1550)  sodium chloride 0.9 % bolus 1,000 mL (1,000 mLs Intravenous New Bag/Given 09/15/19 1518)  etomidate (AMIDATE) injection (20 mg Intravenous Given 09/15/19 1351)  succinylcholine (ANECTINE) injection (100 mg Intravenous Given 09/15/19 1352)  sodium chloride 0.9 % bolus 1,000 mL (1,000 mLs Intravenous New Bag/Given 09/15/19 1631)    ED Course  I have reviewed the triage vital signs and the nursing notes.  Pertinent labs & imaging results that were available during my care of the patient were reviewed by me and considered in my medical decision making (see chart for details).    MDM Rules/Calculators/A&P                      Pt was so agitated, that I gave her ativan and Geodon.  This was enough that she was able to get a CT of her head.  The pt's CT was ok.  When she came back, she was more agitated and was starting to look mottled.  Due to this, she was intubated.  Propofol was started for sedation.  Unfortunately, pt's bp dropped and the propofol had to be stopped.  BP came up with IVFs, but she became agitated again.  Pt given fentanyl and a lower dose of propofol and this has helped, but she is still having periodic dropping of her bp.  Covid did come back negative.  Fever treated with tylenol.  Pt's urine looks partially treated.  Her UA (cath) has many bacteria.   A code sepsis was called.  Pt given a total of 4L of NS.  Pt given IV rocephin for her UTI.  Lactic 6.5.  2nd lactic pending.     Pt d/w Dr. Nelda Marseille (CCM) for admission.  Sherry Kent was evaluated in Emergency Department on 09/15/2019 for the symptoms described in the history of present illness. She was evaluated in the context of the global COVID-19 pandemic, which necessitated consideration that the patient might be at risk for infection with the SARS-CoV-2 virus that causes COVID-19. Institutional protocols and algorithms that pertain to the evaluation of patients at risk for COVID-19 are in a state of rapid change based on information released by regulatory bodies including the CDC and federal and state organizations. These policies and algorithms were followed during the patient's care in the ED.  CRITICAL CARE Performed by: Isla Pence   Total critical care time: 60 minutes  Critical care time was exclusive of separately billable procedures and treating other patients.  Critical care was necessary to treat or prevent imminent or life-threatening deterioration.  Critical care was time spent personally by me on the following  activities: development of treatment plan with patient and/or surrogate as well as nursing, discussions with consultants, evaluation of patient's response to treatment, examination of patient, obtaining history from patient or surrogate, ordering and performing treatments and interventions, ordering and review of laboratory studies, ordering and review of radiographic studies, pulse oximetry and re-evaluation of patient's condition.   Final Clinical Impression(s) / ED Diagnoses Final diagnoses:  Metabolic encephalopathy  [id number] virus not detected  Sepsis with encephalopathy without septic shock, due to unspecified organism [facility])  Acute cystitis without hematuria    Rx / DC Orders ED Discharge Orders    None       Isla Pence, MD 09/15/19 1640

## 2019-09-15 NOTE — Progress Notes (Addendum)
Pharmacy Antibiotic Note  Sherry Kent is a 74 y.o. female admitted on 09/15/2019 with meningitis.  Pharmacy has been consulted for Acyclovir, Ampicillin, Ceftriaxone, and Vancomycin dosing.  Height: 5\' 6"  (167.6 cm) Weight: 169 lb 12.1 oz (77 kg) IBW/kg (Calculated) : 59.3  Temp (24hrs), Avg:100.7 F (38.2 C), Min:99.7 F (37.6 C), Max:101.1 F (38.4 C)  Recent Labs  Lab 09/15/19 1322 09/15/19 1632  WBC 32.1*  --   CREATININE 1.74*  --   LATICACIDVEN 6.5* 2.6*    Estimated Creatinine Clearance: 29.7 mL/min (A) (by C-G formula based on SCr of 1.74 mg/dL (H)).    Allergies  Allergen Reactions  . Norvasc [Amlodipine Besylate] Swelling    10mg  dose causes swelling (07/19/2015).  Tolerates 5mg  with no swelling.    Antimicrobials this admission: 1/13 Acyclovir >>  1/13 Ampicillin >>  1/13 Ceftriaxone >> 1/13 Vancomycin >>  Dose adjustments this admission:   Microbiology results: 1/13 BCx: Pending  1/13 UCx: Pending  1/13 LP : Pending   Plan: - Acyclovir 770mg  IV q12h  - Ampicillin 2g IV q6h  - Ceftriaxone 2g IV q12h - Vancomycin loading dose of 1750mg  IV x 1 dose - Vancomycin 1000 mg IV q24h  - Goal trough of 15-20 mcg/dl - Monitor patients renal function and adjust abx as this improves  - Monitor LP results to streamline therapy   Thank you for allowing pharmacy to be a part of this patient's care.  Duanne Limerick PharmD. BCPS  09/15/2019 6:22 PM

## 2019-09-15 NOTE — ED Notes (Signed)
2L NS bolus started per MD order.

## 2019-09-15 NOTE — Progress Notes (Signed)
EEG complete - results pending 

## 2019-09-15 NOTE — ED Notes (Signed)
Lactic 6.5

## 2019-09-15 NOTE — Progress Notes (Signed)
[facility] Progress Note Patient Name: Sherry Kent DOB: November 15, 1945 MRN: [id number]   Date of Service  09/15/2019  HPI/Events of Note  RT calling with ABG results and requesting clarification of vent orders.  Currently on PC 5, previous orders for PC 12, 8cc/kg.  Pt not dyssynchronous with vent. Sedated. Mild metabolic acidosis.  Lactate improving.   eICU Interventions  PRVC rate 18, peep 5, 8cc/kg F/u ABG 1 hr      Intervention Category Intermediate Interventions: Other:  Darlina Sicilian 09/15/2019, 9:15 PM

## 2019-09-15 NOTE — H&P (Signed)
PULMONARY / CRITICAL CARE MEDICINE   NAME:  Sherry Kent, MRN:  [id number], DOB:  1946-03-20, LOS: 0 ADMISSION DATE:  09/15/2019, CONSULTATION DATE:  1/13 REFERRING MD:  Isla Pence, MD, CHIEF COMPLAINT:  AMS with agitation  BRIEF HISTORY:    This is a 74 year old female who was found down in her home for not showing up for work and subsequently brought to the hospital where she was markedly agitated and confused.  She was ultimately intubated for airway protection due to her confusion to permit brain imaging to be performed.  PCCM was consulted due to mechanical ventilation requirements and hypotension.  The latter of which was most notable following propofol infusion.  HISTORY OF PRESENT ILLNESS   Sherry Kent is a 74 year old female with a past medical history notable for CKD, HTN, HLD, impaired glucose intolerance, who was brought to the ED via EMS for AMS.  She is a professor at [employer] and when she did not show for work this morning a welfare check was completed.  Patient lives alone where she was found down Millie responsive in the shower.  Last known normal was approximate 4 PM the prior day.  The patient agitated, fighting EMS and ER staff, unable to provide any history. Per report the patient's family believe that she was treated for a likely UTI after some confusion approximately 1 week prior.  They were uncertain of the antibiotic  SIGNIFICANT PAST MEDICAL HISTORY   HTN HLD Glucose intolerance Anxiety attacks Vasovagal syncope Carotid artery stenosis CKD  SIGNIFICANT EVENTS:  Intubated on 1/13  STUDIES:   CT Head and cervical spine WO contrast: IMPRESSION: 1.  No acute intracranial abnormality. 2.  No acute cervical spine fracture.  CULTURES:  Blood cultures 1/13>> Urine culture 1/13>>  ANTIBIOTICS:  Ceftriaxone 1/13 >>  LINES/TUBES:  ETT 1/13 >>  CONSULTANTS:  PCCM  SUBJECTIVE:  Patient unable to provide history due to the 5 mental caveat.  The patient's  contact as listed in the chart does not answer.  CONSTITUTIONAL: BP 135/73 (BP Location: Left Arm)   Pulse 82   Temp (!) 100.9 F (38.3 C) (Bladder)   Resp (!) 21   Ht 5\' 6"  (1.676 m)   Wt 77 kg   SpO2 100%   BMI 27.40 kg/m   No intake/output data recorded.     Vent Mode: PCV FiO2 (%):  [45 %-100 %] 45 % Set Rate:  [12 bmp-18 bmp] 12 bmp Vt Set:  [470 mL] 470 mL PEEP:  [5 cmH20] 5 cmH20 Plateau Pressure:  [15 cmH20] 15 cmH20  PHYSICAL EXAM: General: Sedated, intubated, in no acute distress, afebrile, nondiaphoretic, stable on vent HEENT: Pupils pinpoint but equal and reactive, normocephalic, mucous membranes moist Cardio: RRR, no mrg's  Pulmonary: CTA bilaterally, no wheezing or crackles on ventilator Abdomen: Bowel sounds normal, soft MSK: BLE nontender, nonedematous Neuro: Patient sedated, does not move extremities or open eyes on command.  Reflexes intact +2 upper and lower bilaterally Skin: Capillary refill less than 2 seconds, skin is warm and dry to touch  RESOLVED PROBLEM LIST   ASSESSMENT AND PLAN   Acute encephalopathy: Suspect toxic.  CT of the head unremarkable for CVA or structural changes.  Etiology uncertain.  No evidence of DKA.  Patient's glucose averaging approximately 200 but as she is not on an SGLT2 agonist so euglycemic DKA seems unlikely.  She is acidotic with a lactate of 6.5 and corresponding bicarb of 17 which is likely secondary to decreased perfusion  from being down.  Her Covid is negative.  Polypharmacy possible but I do not see many meds on her home medication list that could have who contributed to this.  Evaluation of the [place] controlled substance database does not reveal any prescriptions but I suppose this could been obtained elsewhere.  Patient is a leukocytosis of 32, temp of 100.9 max with initial tachypnea and tachycardia on presentation. Toxic metabolic encephalopathy is most likely, will need further evaluation to rule out  infectious etiology. Plan: -Ethanol level ordered -Cooximetry ordered for carboxyhemoglobin  -Urine tox screen ordered -CK and repeat lactate ordered -Follow-up blood cultures  -Follow-up urine culture -Continue ceftriaxone for 5 days or until clinical improvement or etiology determined -500 cc bolus of NS -Maintenance fluids LR -Consider MRI if no improvement next 24 hours -VAP precautions -Ventilator settings as above  Acute renal injury: Serum creatinine increased to 1.74 from 1.16 months prior.  Potassium normal at 3.7.  Progression uncertain.  Likely acute from prerenal from hypovolemia although the BUN/creatinine ratio does not support this.  UA with mucus and hyaline casts.  Plan: -Renal ultrasound ordered -Repeat BMP Hydration -Morning labs with mag and Phos ordered  Hyperglycemia: Every 4 hours CBG and correction scale insulin ordered Hemoglobin A1c ordered to better assess relation of glucose regulation with current presentation.  SUMMARY OF TODAY'S PLAN:  Monitor in the ICU, wean from sedation and mechanical ventilation as tolerated while undergoing continued evaluation for the source of her agitation.  Best Practice / Goals of Care / Disposition.   DVT PROPHYLAXIS: Enoxaparin [id number] (will need NGT) NUTRITION: Consider tube feeds if intubated past tomorrow MOBILITY: As tolerated GOALS OF CARE: Full code, unable to reach family FAMILY DISCUSSIONS: Unable to reach family DISPOSITION ICU  LABS  Glucose Recent Labs  Lab 09/15/19 1312  GLUCAP 188*   BMET Recent Labs  Lab 09/15/19 1322 09/15/19 1620  NA 139 142  K 3.7 3.3*  CL 102  --   CO2 17*  --   BUN 20  --   CREATININE 1.74*  --   GLUCOSE 211*  --    Liver Enzymes Recent Labs  Lab 09/15/19 1322  AST 35  ALT 20  ALKPHOS 88  BILITOT 0.9  ALBUMIN 4.0   Electrolytes Recent Labs  Lab 09/15/19 1322  CALCIUM 9.5   CBC Recent Labs  Lab 09/15/19 1322 09/15/19 1620  WBC 32.1*  --    HGB 14.1 11.2*  HCT 45.1 33.0*  PLT 470*  --    ABG Recent Labs  Lab 09/15/19 1620  PHART 7.303*  PCO2ART 38.1  PO2ART 287.0*   Coag's Recent Labs  Lab 09/15/19 1632  INR 1.1   Sepsis Markers Recent Labs  Lab 09/15/19 1322  LATICACIDVEN 6.5*   Cardiac Enzymes No results for input(s): TROPONINI, PROBNP in the last 168 hours.  PAST MEDICAL HISTORY :   She  has a past medical history of Anxiety, H/O hematuria, History of colon polyps, Hyperlipidemia, and Hypertension.  PAST SURGICAL HISTORY:  She  has a past surgical history that includes Appendectomy (8/07); Dilation and curettage of uterus; Tonsillectomy; and Eye surgery.  Allergies  Allergen Reactions  . Norvasc [Amlodipine Besylate] Swelling    10mg  dose causes swelling (07/19/2015).  Tolerates 5mg  with no swelling.    No current facility-administered medications on file prior to encounter.   Current Outpatient Medications on File Prior to Encounter  Medication Sig  . aspirin EC 81 MG tablet Take 81 mg  by mouth daily.  Marland Kitchen azithromycin (ZITHROMAX) 250 MG tablet Take 2 tablets x 1 day, then 1 tab daily for 4 days  . benzonatate (TESSALON) 100 MG capsule Take 1 capsule (100 mg total) by mouth 3 (three) times daily as needed for cough.  . Calcium Carbonate-Vit D-Min (CALCIUM 1200 PO) Take by mouth. In divided doses  . Cholecalciferol (SM VITAMIN D3) 4000 UNITS CAPS Take 1 capsule (4,000 Units total) by mouth daily. (Patient not taking: Reported on 03/15/2019)  . cyanocobalamin 1000 MCG tablet Take 100 mcg by mouth daily.  Marland Kitchen lisinopril (ZESTRIL) 40 MG tablet TAKE 1 TABLET(40 MG) BY MOUTH DAILY  . meloxicam (MOBIC) 7.5 MG tablet TAKE 1 TABLET(7.5 MG) BY MOUTH DAILY  . nebivolol (BYSTOLIC) 5 MG tablet TAKE 1 TABLET(5 MG) BY MOUTH DAILY  . omeprazole (PRILOSEC) 20 MG capsule TAKE 1 CAPSULE(20 MG) BY MOUTH DAILY  . ondansetron (ZOFRAN) 4 MG tablet Take 1 tablet (4 mg total) by mouth every 8 (eight) hours as needed for  nausea or vomiting.  . rosuvastatin (CRESTOR) 20 MG tablet TAKE 1 TABLET(20 MG) BY MOUTH DAILY  . sertraline (ZOLOFT) 50 MG tablet TAKE 1 TABLET(50 MG) BY MOUTH DAILY    FAMILY HISTORY:   Her family history includes Colon cancer in her sister.  SOCIAL HISTORY:  She  reports that she quit smoking about 15 years ago. Her smoking use included cigarettes. She has a 30.00 pack-year smoking history. She has never used smokeless tobacco. She reports current alcohol use. She reports that she does not use drugs.  REVIEW OF SYSTEMS:    Unable to obtain as patient sedated on ventilator with a RASS of -2.  Attending Note:  74 year old female with history of Vasovagal syncope who presents to PCCM with respiratory failure after being presenting altered.  Patient intubated for severe agitation.  On exam, sedated and intermittently agitated with clear lungs.  Febrile with high WBC.  I reviewed CXR myself, ETT is in a good position.  Discussed with resident.  Will begin meningitis and encephalitis coverage.  Call neurology consult.  Head CT is negative.  EEG ordered.  Check ABG and adjust vent.  Change to PRVC.  PAD protocol.  TF in AM.  PCCM will admit and manage.  Will perform an LP after consent from nephew.  Nephew updated bedside.  The patient is critically ill with multiple organ systems failure and requires high complexity decision making for assessment and support, frequent evaluation and titration of therapies, application of advanced monitoring technologies and extensive interpretation of multiple databases.   Critical Care Time devoted to patient care services described in this note is  45  Minutes. This time reflects time of care of this signee Dr Jennet Maduro. This critical care time does not reflect procedure time, or teaching time or supervisory time of PA/NP/Med student/Med Resident etc but could involve care discussion time.  Rush Farmer, M.D. [facility] Pulmonary/Critical Care Medicine.

## 2019-09-15 NOTE — ED Triage Notes (Signed)
Pt here from home via [facility], pt found down in shower. LKN was 1600 yesterday. Pt lives alone and currently disoriented x4. Pt extremely combative for EMS and hospital staff. Pt not speaking.

## 2019-09-15 NOTE — Procedures (Signed)
Lumbar Puncture Procedure Note  Pre-operative Diagnosis:  Encephalopathy  Indications:  Fever, WBC count, encephalopathy concerning for Encephalitis   Procedure Details:  Informed consent was obtained after explanation of the risks and benefits of the procedure from the patients son as the patient was intubated. Refer to the consent documentation.  Time-out performed immediately prior to the procedure.  Patient was placed in the lateral decubitus position on her left.  The superior aspect of the iliac crests were identified, with the traverse demarcating the L4-L5 interspace. Intervertebral space was located and marked.  This area was prepped and draped in the usual sterile fashion.  Local anesthesia with 1% lidocaine was applied subcutaneously then deep to the skin. The spinal needle with trocar was introduced with frequent removal of the trocar to evaluate for cerebrospinal fluid. When this fluid was noted samples were collected in four separate tubes and sent to the lab after proper labeling. The spinal needle with trocar was removed, with minimal bleeding noted upon removal. A sterile bandage was placed over the puncture site after holding pressure.  Findings: 22mL of clear spinal fluid was obtained. The sample was sent for CSF cell count with differential, CSF culture, Gram stain, Protein, glucose and HSV.  Condition:   The patient tolerated the procedure well and remains in the same condition as pre-procedure.  Complications: None; patient tolerated the procedure well.  Plan: Pt to remain supine for 1 hour.

## 2019-09-15 NOTE — Progress Notes (Signed)
Patient transported to 2M02 from ED without complications.

## 2019-09-15 NOTE — Progress Notes (Addendum)
eLink Physician-Brief Progress Note Patient Name: Sherry Kent DOB: [birth year] MRN: [id number]   Date of Service  09/15/2019  HPI/Events of Note  74 year old woman with fevers and encephalopathy. Has just arrived to ICU. Seen by bedside ICU team. Is on fentanyl infusion and low dose propofol, apparently propofol drops her BP. LP results pending. On meningitis and encephalitis treatment, seen by neuro. Very agitated on cam though o2 sat is 99 on 45%/peep 5, SBP in 150s.   eICU Interventions  Ordered precedex to help with sedation Is on IV fentanyl, propofol and also has PRN versed in orders Wrist restraints ordered  BMP, lactate to be done (LA was improving on last check) ABG ordered Anti microbials per bedside CCM, follow LP- cxr and ua are clear  Asked that we be called back with results      Intervention Category Major Interventions: Respiratory failure - evaluation and management;Sepsis - evaluation and management;Delirium, psychosis, severe agitation - evaluation and management Evaluation Type: New Patient Evaluation  Margaretmary Lombard 09/15/2019, 8:48 PM   11:30 pm Notified of BMP, lactate and repeat ABG All have improved and lactate normalized No new interventions needed for now

## 2019-09-15 NOTE — Procedures (Signed)
Patient Name: Sherry Kent  MRN: [id number]  Epilepsy Attending: Lora Havens  Referring Physician/Provider: Dr Amie Portland Date: 09/15/2019 Duration: 21.23 mins  Patient history: 74yo F with ams. EEG to evaluate for seizure  Level of alertness: comatose  AEDs during EEG study: None  Technical aspects: This EEG study was done with scalp electrodes positioned according to the 10-20 International system of electrode placement. Electrical activity was acquired at a sampling rate of 500Hz  and reviewed with a high frequency filter of 70Hz  and a low frequency filter of 1Hz . EEG data were recorded continuously and digitally stored.   DESCRIPTION: EEG showed continuous generalized background attenuation. EEG was not reactive to tactile stimulation. Hyperventilation and photic stimulation were not performed.  ABNORMALITY - Background attenuation, generalized  IMPRESSION: This study is suggestive of profound diffuse encephalopathy, non specific to etiology.  No seizures or epileptiform discharges were seen throughout the recording.  Alejos Reinhardt Barbra Sarks

## 2019-09-16 ENCOUNTER — Inpatient Hospital Stay (HOSPITAL_COMMUNITY): Payer: Medicare PPO

## 2019-09-16 DIAGNOSIS — I10 Essential (primary) hypertension: Secondary | ICD-10-CM

## 2019-09-16 DIAGNOSIS — G9340 Encephalopathy, unspecified: Secondary | ICD-10-CM

## 2019-09-16 DIAGNOSIS — R55 Syncope and collapse: Secondary | ICD-10-CM

## 2019-09-16 DIAGNOSIS — E785 Hyperlipidemia, unspecified: Secondary | ICD-10-CM

## 2019-09-16 LAB — COMPREHENSIVE METABOLIC PANEL
ALT: 24 U/L (ref 0–44)
AST: 41 U/L (ref 15–41)
Albumin: 3.1 g/dL — ABNORMAL LOW (ref 3.5–5.0)
Alkaline Phosphatase: 64 U/L (ref 38–126)
Anion gap: 11 (ref 5–15)
BUN: 19 mg/dL (ref 8–23)
CO2: 19 mmol/L — ABNORMAL LOW (ref 22–32)
Calcium: 8.1 mg/dL — ABNORMAL LOW (ref 8.9–10.3)
Chloride: 110 mmol/L (ref 98–111)
Creatinine, Ser: 1.51 mg/dL — ABNORMAL HIGH (ref 0.44–1.00)
GFR calc Af Amer: 39 mL/min — ABNORMAL LOW (ref >60)
GFR calc non Af Amer: 34 mL/min — ABNORMAL LOW (ref >60)
Glucose, Bld: 110 mg/dL — ABNORMAL HIGH (ref 70–99)
Potassium: 3.8 mmol/L (ref 3.5–5.1)
Sodium: 140 mmol/L (ref 135–145)
Total Bilirubin: 0.7 mg/dL (ref 0.3–1.2)
Total Protein: 6 g/dL — ABNORMAL LOW (ref 6.5–8.1)

## 2019-09-16 LAB — PROCALCITONIN: Procalcitonin: 1.25 ng/mL

## 2019-09-16 LAB — HSV DNA BY PCR (REFERENCE LAB)
HSV 1 DNA: NEGATIVE
HSV 2 DNA: NEGATIVE

## 2019-09-16 LAB — GLUCOSE, CAPILLARY
Glucose-Capillary: 105 mg/dL — ABNORMAL HIGH (ref 70–99)
Glucose-Capillary: 113 mg/dL — ABNORMAL HIGH (ref 70–99)
Glucose-Capillary: 114 mg/dL — ABNORMAL HIGH (ref 70–99)
Glucose-Capillary: 116 mg/dL — ABNORMAL HIGH (ref 70–99)
Glucose-Capillary: 119 mg/dL — ABNORMAL HIGH (ref 70–99)
Glucose-Capillary: 93 mg/dL (ref 70–99)
Glucose-Capillary: 96 mg/dL (ref 70–99)
Glucose-Capillary: 97 mg/dL (ref 70–99)

## 2019-09-16 LAB — CK
Total CK: 2241 U/L — ABNORMAL HIGH (ref 38–234)
Total CK: 1915 U/L — ABNORMAL HIGH (ref 38–234)

## 2019-09-16 LAB — CBC
HCT: 43.1 % (ref 36.0–46.0)
HCT: 37 % (ref 36.0–46.0)
Hemoglobin: 13.5 g/dL (ref 12.0–15.0)
Hemoglobin: 11.6 g/dL — ABNORMAL LOW (ref 12.0–15.0)
MCH: 26.7 pg (ref 26.0–34.0)
MCH: 26.5 pg (ref 26.0–34.0)
MCHC: 31.3 g/dL (ref 30.0–36.0)
MCHC: 31.4 g/dL (ref 30.0–36.0)
MCV: 85.2 fL (ref 80.0–100.0)
MCV: 84.7 fL (ref 80.0–100.0)
Platelets: 296 10*3/uL (ref 150–400)
Platelets: 241 10*3/uL (ref 150–400)
RBC: 5.06 MIL/uL (ref 3.87–5.11)
RBC: 4.37 MIL/uL (ref 3.87–5.11)
RDW: 15.8 % — ABNORMAL HIGH (ref 11.5–15.5)
RDW: 15.8 % — ABNORMAL HIGH (ref 11.5–15.5)
WBC: 19.4 10*3/uL — ABNORMAL HIGH (ref 4.0–10.5)
WBC: 20 10*3/uL — ABNORMAL HIGH (ref 4.0–10.5)
nRBC: 0 % (ref 0.0–0.2)
nRBC: 0 % (ref 0.0–0.2)

## 2019-09-16 LAB — URINE CULTURE: Culture: NO GROWTH

## 2019-09-16 LAB — POCT I-STAT 7, (LYTES, BLD GAS, ICA,H+H)
Acid-base deficit: 8 mmol/L — ABNORMAL HIGH (ref 0.0–2.0)
Bicarbonate: 17.8 mmol/L — ABNORMAL LOW (ref 20.0–28.0)
Calcium, Ion: 1.17 mmol/L (ref 1.15–1.40)
HCT: 34 % — ABNORMAL LOW (ref 36.0–46.0)
Hemoglobin: 11.6 g/dL — ABNORMAL LOW (ref 12.0–15.0)
O2 Saturation: 97 %
Patient temperature: 37.5
Potassium: 3.4 mmol/L — ABNORMAL LOW (ref 3.5–5.1)
Sodium: 141 mmol/L (ref 135–145)
TCO2: 19 mmol/L — ABNORMAL LOW (ref 22–32)
pCO2 arterial: 37.1 mmHg (ref 32.0–48.0)
pH, Arterial: 7.291 — ABNORMAL LOW (ref 7.350–7.450)
pO2, Arterial: 99 mmHg (ref 83.0–108.0)

## 2019-09-16 LAB — BASIC METABOLIC PANEL
Anion gap: 13 (ref 5–15)
BUN: 23 mg/dL (ref 8–23)
CO2: 17 mmol/L — ABNORMAL LOW (ref 22–32)
Calcium: 8 mg/dL — ABNORMAL LOW (ref 8.9–10.3)
Chloride: 111 mmol/L (ref 98–111)
Creatinine, Ser: 1.66 mg/dL — ABNORMAL HIGH (ref 0.44–1.00)
GFR calc Af Amer: 35 mL/min — ABNORMAL LOW (ref >60)
GFR calc non Af Amer: 30 mL/min — ABNORMAL LOW (ref >60)
Glucose, Bld: 116 mg/dL — ABNORMAL HIGH (ref 70–99)
Potassium: 3.9 mmol/L (ref 3.5–5.1)
Sodium: 141 mmol/L (ref 135–145)

## 2019-09-16 LAB — PHOSPHORUS
Phosphorus: 3.8 mg/dL (ref 2.5–4.6)
Phosphorus: 3.4 mg/dL (ref 2.5–4.6)

## 2019-09-16 LAB — ECHOCARDIOGRAM COMPLETE
Height: 66 in
Weight: 2786.61 oz

## 2019-09-16 LAB — MAGNESIUM
Magnesium: 1.7 mg/dL (ref 1.7–2.4)
Magnesium: 2.4 mg/dL (ref 1.7–2.4)

## 2019-09-16 MED ORDER — POTASSIUM CHLORIDE 10 MEQ/100ML IV SOLN
10.0000 meq | INTRAVENOUS | Status: AC
  Administered 2019-09-16 (×2): 10 meq via INTRAVENOUS
  Filled 2019-09-16 (×2): qty 100

## 2019-09-16 MED ORDER — ACETAMINOPHEN 650 MG RE SUPP: 650.0000 mg | Freq: Four times a day (QID) | RECTAL | Status: AC

## 2019-09-16 MED ORDER — LABETALOL HCL 5 MG/ML IV SOLN
10.0000 mg | INTRAVENOUS | Status: DC | PRN
  Administered 2019-09-16 – 2019-09-19 (×6): 10 mg via INTRAVENOUS
  Filled 2019-09-16 (×6): qty 4

## 2019-09-16 MED ORDER — IPRATROPIUM-ALBUTEROL 0.5-2.5 (3) MG/3ML IN SOLN
3.0000 mL | Freq: Four times a day (QID) | RESPIRATORY_TRACT | Status: DC
  Administered 2019-09-16 – 2019-09-17 (×5): 3 mL via RESPIRATORY_TRACT
  Filled 2019-09-16 (×4): qty 3

## 2019-09-16 MED ORDER — CIPROFLOXACIN HCL 0.3 % OP SOLN
2.0000 [drp] | OPHTHALMIC | Status: AC
  Administered 2019-09-16 – 2019-09-21 (×25): 2 [drp] via OPHTHALMIC
  Filled 2019-09-16: qty 2.5

## 2019-09-16 MED ORDER — LACTATED RINGERS IV SOLN: INTRAVENOUS | Status: DC

## 2019-09-16 MED ORDER — MAGNESIUM SULFATE 2 GM/50ML IV SOLN
2.0000 g | Freq: Once | INTRAVENOUS | Status: AC
  Administered 2019-09-16: 2 g via INTRAVENOUS
  Filled 2019-09-16: qty 50

## 2019-09-16 MED ORDER — ENOXAPARIN SODIUM 40 MG/0.4ML ~~LOC~~ SOLN
40.0000 mg | SUBCUTANEOUS | Status: DC
  Administered 2019-09-16 – 2019-09-20 (×5): 40 mg via SUBCUTANEOUS
  Filled 2019-09-16 (×5): qty 0.4

## 2019-09-16 MED ORDER — AMLODIPINE BESYLATE 5 MG PO TABS
5.0000 mg | ORAL_TABLET | Freq: Every day | ORAL | Status: DC
  Administered 2019-09-16 – 2019-09-17 (×2): 5 mg via ORAL
  Filled 2019-09-16 (×2): qty 1

## 2019-09-16 MED ORDER — ACETAMINOPHEN 325 MG PO TABS
650.0000 mg | ORAL_TABLET | Freq: Four times a day (QID) | ORAL | Status: AC
  Administered 2019-09-16: 650 mg via ORAL
  Filled 2019-09-16 (×2): qty 2

## 2019-09-16 MED ORDER — CHLORHEXIDINE GLUCONATE CLOTH 2 % EX PADS
6.0000 | MEDICATED_PAD | Freq: Every day | CUTANEOUS | Status: DC
  Administered 2019-09-16 – 2019-09-17 (×2): 6 via TOPICAL

## 2019-09-16 MED ORDER — ACETAMINOPHEN 160 MG/5ML PO SOLN: 650.0000 mg | Freq: Three times a day (TID) | ORAL | Status: DC | PRN

## 2019-09-16 MED ORDER — IPRATROPIUM-ALBUTEROL 0.5-2.5 (3) MG/3ML IN SOLN
RESPIRATORY_TRACT | Status: AC
  Filled 2019-09-16: qty 3

## 2019-09-16 MED ORDER — PANTOPRAZOLE SODIUM 40 MG PO TBEC
40.0000 mg | DELAYED_RELEASE_TABLET | Freq: Every day | ORAL | Status: DC
  Administered 2019-09-17 – 2019-09-21 (×5): 40 mg via ORAL
  Filled 2019-09-16 (×5): qty 1

## 2019-09-16 MED ORDER — BISOPROLOL FUMARATE 5 MG PO TABS
5.0000 mg | ORAL_TABLET | Freq: Every day | ORAL | Status: DC
  Administered 2019-09-16 – 2019-09-20 (×5): 5 mg via ORAL
  Filled 2019-09-16 (×6): qty 1

## 2019-09-16 MED ORDER — ONDANSETRON HCL 4 MG/2ML IJ SOLN
4.0000 mg | Freq: Four times a day (QID) | INTRAMUSCULAR | Status: DC | PRN
  Administered 2019-09-16 – 2019-09-17 (×4): 4 mg via INTRAVENOUS
  Filled 2019-09-16 (×4): qty 2

## 2019-09-16 MED ORDER — ACETAMINOPHEN 325 MG PO TABS
650.0000 mg | ORAL_TABLET | Freq: Four times a day (QID) | ORAL | Status: DC | PRN
  Administered 2019-09-18: 650 mg via ORAL
  Filled 2019-09-16 (×2): qty 2

## 2019-09-16 NOTE — Progress Notes (Addendum)
[facility] Progress Note Patient Name: Sherry Kent DOB: 10-28-1945 MRN: [id number]   Date of Service  09/16/2019  HPI/Events of Note  Request for tylenol via feeding tube. CXR does not show the tip of the feeding tube, so will obtain a KUB first   eICU Interventions  KUB and then PRN tylenol if tube is in good position Please call once KUB is available for review     Intervention Category Intermediate Interventions: Other:  Margaretmary Lombard 09/16/2019, 3:14 AM   4.10 am KUB reported by radiology Tylenol PRN ordered

## 2019-09-16 NOTE — Progress Notes (Signed)
  Echocardiogram 2D Echocardiogram has been performed.  Darrio Bade A Anella Nakata 09/16/2019, 9:58 AM

## 2019-09-16 NOTE — Progress Notes (Signed)
ATTESTATION & SIGNATURE   STAFF NOTE: I, Dr Ann Lions have personally reviewed patient's available data, including medical history, events of note, physical examination and test results as part of my evaluation. I have discussed with resident/NP and other care providers such as pharmacist, RN and RRT.  In addition,  I personally evaluated patient and elicited key findings of   S: Date of admit 09/15/2019 with LOS 1 for today 09/16/2019 : Sherry Kent is  Confusion for few weeks with opd UTI Rx. Then no show to work -> welfare check resulted in unresponsive patietn at home -> intubated   - not encephalopathic anymore - answering questions to resident through ventilator - not on vasopressors - LP cell count ok and normal prot and glucose. Cultures pending - UA dirty   Fever and wbc improving  EEG with non specific encephalopathy   has a past medical history of Anxiety, H/O hematuria, History of colon polyps, Hyperlipidemia, and Hypertension.   has a past surgical history that includes Appendectomy (8/07); Dilation and curettage of uterus; Tonsillectomy; and Eye surgery.  reports that she quit smoking about 15 years ago. Her smoking use included cigarettes. She has a 30.00 pack-year smoking history. She has never used smokeless tobacco.    O:  Blood pressure 127/63, pulse 88, temperature 99.3 F (37.4 C), resp. rate 18, height 5\' 6"  (1.676 m), weight 79 kg, SpO2 99 %.  Left eye conjuctitivis Following commands on ventilator Sems oriented -but did not get year right but oherwise was aware of month, town, location   [place]  Lab 09/15/19 1620 09/15/19 1732 09/15/19 2056 09/15/19 2200 09/16/19 0339  PHART 7.303*  --  7.287* 7.313* 7.291*  PCO2ART 38.1  --  39.1 35.1 37.1  PO2ART 287.0*  --  125.0* 86.0 99.0  HCO3 18.9*  --  18.5* 17.7* 17.8*  TCO2 20*  --  20* 19* 19*  O2SAT 100.0 65.6 98.0 95.0 97.0    CBC Recent Labs  Lab 09/15/19 1322  09/15/19 1620 09/15/19 2200 09/16/19 0311 09/16/19 0339  HGB 14.1   < > 10.5* 13.5 11.6*  HCT 45.1   < > 31.0* 43.1 34.0*  WBC 32.1*  --   --  19.4*  --   PLT 470*  --   --  296  --    < > = values in this interval not displayed.    COAGULATION Recent Labs  Lab 09/15/19 1632  INR 1.1    CARDIAC  No results for input(s): TROPONINI in the last 168 hours. No results for input(s): PROBNP in the last 168 hours.   CHEMISTRY Recent Labs  Lab 09/15/19 1322 09/15/19 1620 09/15/19 2056 09/15/19 2056 09/15/19 2200 09/15/19 2200 09/15/19 2230 09/15/19 2230 09/16/19 0311 09/16/19 0339  NA 139   < > 141  --  142  --  141  --  141 141  K 3.7   < > 3.6   < > 3.4*   < > 3.7   < > 3.9 3.4*  CL 102  --   --   --   --   --  112*  --  111  --   CO2 17*  --   --   --   --   --  19*  --  17*  --   GLUCOSE 211*  --   --   --   --   --  105*  --  116*  --   BUN 20  --   --   --   --   --  21  --  23  --   CREATININE 1.74*  --   --   --   --   --  1.68*  --  1.66*  --   CALCIUM 9.5  --   --   --   --   --  7.5*  --  8.0*  --   MG  --   --   --   --   --   --   --   --  1.7  --   PHOS  --   --   --   --   --   --   --   --  3.8  --    < > = values in this interval not displayed.   Estimated Creatinine Clearance: 31.5 mL/min (A) (by C-G formula based on SCr of 1.66 mg/dL (H)).   LIVER Recent Labs  Lab 09/15/19 1322 09/15/19 1632  AST 35  --   ALT 20  --   ALKPHOS 88  --   BILITOT 0.9  --   PROT 7.7  --   ALBUMIN 4.0  --   INR  --  1.1     INFECTIOUS Recent Labs  Lab 09/15/19 1322 09/15/19 1632 09/15/19 2230  LATICACIDVEN 6.5* 2.6* 1.9     ENDOCRINE CBG (last 3)  Recent Labs    09/15/19 2308 09/16/19 0306 09/16/19 0806  GLUCAP 95 119* 96         IMAGING x24h  - image(s) personally visualized  -   highlighted in bold DG Wrist 2 Views Left  Result Date: 09/15/2019 CLINICAL DATA:  Found down in shower, confused, disoriented EXAM: LEFT WRIST - 2 VIEW  COMPARISON:  None. FINDINGS: No fracture or dislocation of the left wrist. The carpus is normally aligned. Mild arthrosis. Soft tissues are unremarkable. IMPRESSION: No fracture or dislocation of the left wrist. The carpus is normally aligned. Electronically Signed   By: Eddie Candle M.D.   On: 09/15/2019 14:32   DG Abd 1 View  Result Date: 09/16/2019 CLINICAL DATA:  74 year old female enteric tube placement. EXAM: ABDOMEN - 1 VIEW COMPARISON:  None. FINDINGS: Portable AP semi upright view at 0318 hours. Enteric tube courses into the stomach with side hole at the level of the gastric body. Mild streaky opacity at the left lung base. Visible right lung base appears negative. Visualized bowel gas pattern is non obstructed. Scoliosis. No acute osseous abnormality identified. IMPRESSION: Enteric tube placed into the stomach with side hole at the level of the gastric body. Electronically Signed   By: Genevie Ann M.D.   On: 09/16/2019 03:50   CT Head Wo Contrast  Result Date: 09/15/2019 CLINICAL DATA:  Found down in the shower.  Altered mental status. EXAM: CT HEAD WITHOUT CONTRAST CT CERVICAL SPINE WITHOUT CONTRAST TECHNIQUE: Multidetector CT imaging of the head and cervical spine was performed following the standard protocol without intravenous contrast. Multiplanar CT image reconstructions of the cervical spine were also generated. COMPARISON:  MR brain and CT head dated December 11, 2011. FINDINGS: CT HEAD FINDINGS Brain: No evidence of acute infarction, hemorrhage, hydrocephalus, extra-axial collection or mass lesion/mass effect. Stable mild chronic microvascular ischemic changes. Vascular: No hyperdense vessel or unexpected calcification. Skull: Normal. Negative for fracture or focal lesion. Sinuses/Orbits: Pansinus mucosal thickening. Air-fluid level in the right maxillary sinus. The  mastoid air cells are clear. The orbits are unremarkable. Other: None. CT CERVICAL SPINE FINDINGS Alignment: Normal. Skull base and  vertebrae: No acute fracture. No primary bone lesion or focal pathologic process. 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 Soft tissues and spinal canal: No prevertebral fluid or swelling. No visible canal hematoma. Disc levels: Severe disc height loss and advanced uncovertebral hypertrophy from C4-C5 through C6-C7. Upper chest: Mild emphysema. Other: None. IMPRESSION: 1.  No acute intracranial abnormality. 2.  No acute cervical spine fracture. Electronically Signed   By: Titus Dubin M.D.   On: 09/15/2019 13:12   CT Cervical Spine Wo Contrast  Result Date: 09/15/2019 CLINICAL DATA:  Found down in the shower.  Altered mental status. EXAM: CT HEAD WITHOUT CONTRAST CT CERVICAL SPINE WITHOUT CONTRAST TECHNIQUE: Multidetector CT imaging of the head and cervical spine was performed following the standard protocol without intravenous contrast. Multiplanar CT image reconstructions of the cervical spine were also generated. COMPARISON:  MR brain and CT head dated December 11, 2011. FINDINGS: CT HEAD FINDINGS Brain: No evidence of acute infarction, hemorrhage, hydrocephalus, extra-axial collection or mass lesion/mass effect. Stable mild chronic microvascular ischemic changes. Vascular: No hyperdense vessel or unexpected calcification. Skull: Normal. Negative for fracture or focal lesion. Sinuses/Orbits: Pansinus mucosal thickening. Air-fluid level in the right maxillary sinus. The mastoid air cells are clear. The orbits are unremarkable. Other: None. CT CERVICAL SPINE FINDINGS Alignment: Normal. Skull base and vertebrae: No acute fracture. No primary bone lesion or focal pathologic process. 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 Soft tissues and spinal canal: No prevertebral fluid or swelling. No visible canal hematoma. Disc levels: Severe disc height loss and advanced uncovertebral hypertrophy from C4-C5 through C6-C7. Upper chest: Mild emphysema. Other: None. IMPRESSION: 1.  No acute  intracranial abnormality. 2.  No acute cervical spine fracture. Electronically Signed   By: Titus Dubin M.D.   On: 09/15/2019 13:12   US RENAL  Result Date: 09/15/2019 CLINICAL DATA:  AKI EXAM: RENAL / URINARY TRACT ULTRASOUND COMPLETE COMPARISON:  None. FINDINGS: Right Kidney: Renal measurements: 10 x 4.4 x 4.1 cm = volume: 92.6 mL . Echogenicity within normal limits. No mass or hydronephrosis visualized. Left Kidney: Renal measurements: 10.3 x 5.4 x 3.5 cm = volume: 101.9 mL. Echogenicity within normal limits. No mass or hydronephrosis visualized. Bladder: Decompressed by Foley catheter and therefore poorly assessed by sonography. Other: Patient is intubated at the time of exam with inability to reposition patient. IMPRESSION: Unremarkable appearance of the kidneys. Bladder is decompressed by Foley catheter. Technically difficult exam secondary to inability to reposition this intubated patient. Electronically Signed   By: Lovena Le M.D.   On: 09/15/2019 19:36   DG Pelvis Portable  Result Date: 09/15/2019 CLINICAL DATA:  Pain. The patient was found down. EXAM: PORTABLE PELVIS 1-2 VIEWS COMPARISON:  None. FINDINGS: There is no evidence of pelvic fracture or diastasis. No pelvic bone lesions are seen. Degenerative changes in the lower lumbar spine. IMPRESSION: Normal appearing pelvis. Degenerative changes in the lower lumbar spine. Electronically Signed   By: Lorriane Shire M.D.   On: 09/15/2019 14:34   DG Chest Port 1 View  Result Date: 09/16/2019 CLINICAL DATA:  74 year old female intubated after found down. EXAM: PORTABLE CHEST 1 VIEW COMPARISON:  09/15/2019 portable chest. FINDINGS: Portable AP upright view at 0322 hours. Endotracheal tube tip in good position between the level the clavicles and carina. Enteric tube courses to the abdomen. Mildly lower lung volumes. Streaky left  lung base opacity is new and most resembles atelectasis. Elsewhere lungs remain clear allowing for portable  technique. Normal cardiac size and mediastinal contours. No acute osseous abnormality identified. IMPRESSION: 1.  Stable lines and tubes. 2. Left lung base atelectasis. No other acute cardiopulmonary abnormality. Electronically Signed   By: Genevie Ann M.D.   On: 09/16/2019 03:52   DG Chest Portable 1 View  Result Date: 09/15/2019 CLINICAL DATA:  Altered level of consciousness. Patient was found down. EXAM: PORTABLE CHEST 1 VIEW COMPARISON:  04/08/2006 FINDINGS: Endotracheal tube in good position 3 cm above the carina. NG tube tip below the diaphragm. Heart size and pulmonary vascularity are normal. Lungs are clear. No bone abnormality. IMPRESSION: Endotracheal tube in good position. Lungs are clear. Electronically Signed   By: Lorriane Shire M.D.   On: 09/15/2019 14:33   EEG adult  Result Date: 09/15/2019 Lora Havens, MD     09/15/2019  8:21 PM Patient Name: JERISHA RISPER MRN: [id number] Epilepsy Attending: Lora Havens Referring Physician/Provider: Dr Amie [place] Date: 09/15/2019 Duration: 21.23 mins Patient history: 74yo F with ams. EEG to evaluate for seizure Level of alertness: comatose AEDs during EEG study: None Technical aspects: This EEG study was done with scalp electrodes positioned according to the 10-20 International system of electrode placement. Electrical activity was acquired at a sampling rate of 500Hz  and reviewed with a high frequency filter of 70Hz  and a low frequency filter of 1Hz . EEG data were recorded continuously and digitally stored. DESCRIPTION: EEG showed continuous generalized background attenuation. EEG was not reactive to tactile stimulation. Hyperventilation and photic stimulation were not performed. ABNORMALITY - Background attenuation, generalized IMPRESSION: This study is suggestive of profound diffuse encephalopathy, non specific to etiology.  No seizures or epileptiform discharges were seen throughout the recording. Priyanka Barbra Sarks      A: Acute respirator  failure du eto acute encephalopathy - meets SBT criteria Acute encephalopathy - likely UTI. LP not c/w meningitis. Need to continue to monitor siutation UTI - likely Mild low MAg and low K  P: STart SBT -> and if well extubate Replete mag Dc mening abx Continue UTI Rx Check PCT   Anti-infectives (From admission, onward)   Start     Dose/Rate Route Frequency Ordered Stop   09/16/19 1900  vancomycin (VANCOCIN) IVPB 1000 mg/200 mL premix     1,000 mg 200 mL/hr over 60 Minutes Intravenous Every 24 hours 09/15/19 1806     09/16/19 0500  cefTRIAXone (ROCEPHIN) 2 g in sodium chloride 0.9 % 100 mL IVPB     2 g 200 mL/hr over 30 Minutes Intravenous Every 12 hours 09/15/19 1806     09/15/19 1827  ampicillin (OMNIPEN) 2 g in sodium chloride 0.9 % 100 mL IVPB     2 g 300 mL/hr over 20 Minutes Intravenous Every 6 hours 09/15/19 1827     09/15/19 1815  ampicillin (OMNIPEN) 2 g in sodium chloride 0.9 % 100 mL IVPB  Status:  Discontinued     2 g 300 mL/hr over 20 Minutes Intravenous Every 4 hours 09/15/19 1806 09/15/19 1827   09/15/19 1815  vancomycin (VANCOREADY) IVPB 1750 mg/350 mL     1,750 mg 175 mL/hr over 120 Minutes Intravenous  Once 09/15/19 1806 09/15/19 2315   09/15/19 1815  acyclovir (ZOVIRAX) 770 mg in dextrose 5 % 150 mL IVPB     10 mg/kg  77 kg 165.4 mL/hr over 60 Minutes Intravenous Every 12 hours 09/15/19 1806  09/15/19 1400  cefTRIAXone (ROCEPHIN) 2 g in sodium chloride 0.9 % 100 mL IVPB     2 g 200 mL/hr over 30 Minutes Intravenous  Once 09/15/19 1357 09/15/19 1721       Rest per NP/medical resident whose note is outlined above and that I agree with  The patient is critically ill with multiple organ systems failure and requires high complexity decision making for assessment and support, frequent evaluation and titration of therapies, application of advanced monitoring technologies and extensive interpretation of multiple databases.   Critical Care Time devoted to  patient care services described in this note is  35  Minutes. This time reflects time of care of this signee Dr Brand Males. This critical care time does not reflect procedure time, or teaching time or supervisory time of PA/NP/Med student/Med Resident etc but could involve care discussion time     Dr. Brand Males, M.D., [facility].C.P Pulmonary and Critical Care Medicine Staff Physician [place] [facility] Pager: [phone], If no answer or between  15:00h - 7:00h: call [phone]  09/16/2019 8:53 AM

## 2019-09-16 NOTE — Progress Notes (Signed)
PULMONARY / CRITICAL CARE MEDICINE   NAME:  Sherry Kent, MRN:  [id number], DOB:  10-11-1945, LOS: 1 ADMISSION DATE:  09/15/2019, CONSULTATION DATE:  1/13 REFERRING MD:  Isla Pence, MD, CHIEF COMPLAINT:  AMS with agitation  BRIEF HISTORY:    This is a 74 year old female who was found down in her home for not showing up for work and subsequently brought to the hospital where she was markedly agitated and confused.  She was ultimately intubated for airway protection due to her confusion to permit brain imaging to be performed.  PCCM was consulted due to mechanical ventilation requirements and hypotension.  The latter of which was most notable following propofol infusion.  HISTORY OF PRESENT ILLNESS   Sherry Kent is a 74 year old female with a past medical history notable for CKD, HTN, HLD, impaired glucose intolerance, who was brought to the ED via EMS for AMS.  She is a professor at [employer] and when she did not show for work this morning a welfare check was completed.  Patient lives alone where she was found down Millie responsive in the shower.  Last known normal was approximate 4 PM the prior day.  The patient agitated, fighting EMS and ER staff, unable to provide any history.Per report the patient's family believe that she was treated for a likely UTI after some confusion approximately 1 week prior.  They were uncertain of the antibiotic  SIGNIFICANT PAST MEDICAL HISTORY   HTN HLD Glucose intolerance Anxiety attacks Vasovagal syncope Carotid artery stenosis CKD  SIGNIFICANT EVENTS:  Intubated on 1/13  STUDIES:   CT Head and cervical spine WO contrast: IMPRESSION: 1.  No acute intracranial abnormality. 2.  No acute cervical spine fracture.  CULTURES:  Blood cultures 1/13>> Urine culture 1/13>>  ANTIBIOTICS:  Ceftriaxone 1/13 >> Vancomycin 1/13>> Ampicillin 1/13>>1/14 Acyclovir 1/13>>1/14 LINES/TUBES:  ETT 1/13 >>  CONSULTANTS:  PCCM  SUBJECTIVE:  Pt answering yes no  questions appropriately denies pain or discomfort, she is unsure where she is or why she's here we talked briefly about what happened and about the plan going forward  CONSTITUTIONAL: BP (!) 154/66   Pulse 88   Temp 99.1 F (37.3 C)   Resp 18   Ht 5\' 6"  (1.676 m)   Wt 79 kg   SpO2 99%   BMI 28.11 kg/m   I/O last 3 completed shifts: In: 3860.2 [I.V.:758.3; IV Piggyback:3101.9] Out: 620 [Urine:620]     Vent Mode: PRVC FiO2 (%):  [40 %-100 %] 40 % Set Rate:  [12 bmp-18 bmp] 18 bmp Vt Set:  [470 mL] 470 mL PEEP:  [5 cmH20] 5 cmH20 Plateau Pressure:  [11 cmH20-17 cmH20] 14 cmH20  PHYSICAL EXAM: General: Sedated, intubated, in no acute distress, afebrile, nondiaphoretic, stable on vent HEENT: PERRLA, there is a thick green discharge in left eye holding lid shut normocephalic, mucous membranes moist Cardio: RRR, no mrg's  Pulmonary: CTA bilaterally, no wheezing or crackles on ventilator Abdomen: Bowel sounds normal, soft MSK: BLE nontender, nonedematous Neuro: Cranial nerves in tact, normal sensation in extremities trunk and face, following commands, 5/5 strength bilaterally equal throughout, no focal deficits Skin: Capillary refill less than 2 seconds, skin is warm and dry to touch  RESOLVED PROBLEM LIST   ASSESSMENT AND PLAN   Acute encephalopathy/Leukoctosis/Fever: Initial suspicion toxic metabolic in origin.  CT of the head unremarkable for CVA or structural changes.  Etiology uncertain.  No evidence of DKA.  On admission acidotic with a lactate of 6.5 and corresponding  bicarb of 17.  She had a lumbar puncture that was largely unremarkable.  Infection remains high in the differential, she is on empiric ceftriaxone for presumed urinary source.  1/14 dramatic improvement in encephalopathy.    Plan: -Only on fentanyl alert and comfortable, continue weaning off, SBT today get more history once extubated -Follow-up cultures -Continue empiric abx can deescalate from meningitis  coverage order procalcitonin -Continue Maintenance fluids LR -VAP precautions -Ventilator settings as above  Acute renal injury: Serum creatinine increased to 1.74 from 1.16 months prior.  Potassium normal at 3.7.  Progression uncertain.  Likely acute from prerenal from hypovolemia although the BUN/creatinine ratio does not support this.  UA with mucus and hyaline casts. Renal ultrasound negative for causes of AKI Plan: -Continue IVF -serial bmp  Hyperglycemia: Every 4 hours CBG and correction scale insulin ordered Hemoglobin A1c 6.0   Conjunctivitis: L>R eye appears to be bacterial based on exudate pattern Cover with ciprofloxacin as pt wears contacts, she does not currently have contacts on.    SUMMARY OF TODAY'S PLAN:  Monitor in the ICU, wean from sedation and mechanical ventilation as tolerated while undergoing continued evaluation for the source of her agitation.  Best Practice / Goals of Care / Disposition.   DVT PROPHYLAXIS: Enoxaparin [id number]  NUTRITION: Consider tube feeds if intubated past tomorrow MOBILITY: As tolerated GOALS OF CARE: Full code, unable to reach family FAMILY DISCUSSIONS: call family today DISPOSITION ICU  LABS  Glucose Recent Labs  Lab 09/15/19 1312 09/15/19 2048 09/15/19 2308 09/16/19 0306  GLUCAP 188* 129* 95 119*   BMET Recent Labs  Lab 09/15/19 1322 09/15/19 1620 09/15/19 2230 09/16/19 0311 09/16/19 0339  NA 139   < > 141 141 141  K 3.7   < > 3.7 3.9 3.4*  CL 102  --  112* 111  --   CO2 17*  --  19* 17*  --   BUN 20  --  21 23  --   CREATININE 1.74*  --  1.68* 1.66*  --   GLUCOSE 211*  --  105* 116*  --    < > = values in this interval not displayed.   Liver Enzymes Recent Labs  Lab 09/15/19 1322  AST 35  ALT 20  ALKPHOS 88  BILITOT 0.9  ALBUMIN 4.0   Electrolytes Recent Labs  Lab 09/15/19 1322 09/15/19 2230 09/16/19 0311  CALCIUM 9.5 7.5* 8.0*  MG  --   --  1.7  PHOS  --   --  3.8   CBC Recent Labs   Lab 09/15/19 1322 09/15/19 1620 09/15/19 2200 09/16/19 0311 09/16/19 0339  WBC 32.1*  --   --  19.4*  --   HGB 14.1   < > 10.5* 13.5 11.6*  HCT 45.1   < > 31.0* 43.1 34.0*  PLT 470*  --   --  296  --    < > = values in this interval not displayed.   ABG Recent Labs  Lab 09/15/19 2056 09/15/19 2200 09/16/19 0339  PHART 7.287* 7.313* 7.291*  PCO2ART 39.1 35.1 37.1  PO2ART 125.0* 86.0 99.0   Coag's Recent Labs  Lab 09/15/19 1632  INR 1.1   Sepsis Markers Recent Labs  Lab 09/15/19 1322 09/15/19 1632 09/15/19 2230  LATICACIDVEN 6.5* 2.6* 1.9   Cardiac Enzymes No results for input(s): TROPONINI, PROBNP in the last 168 hours.  PAST MEDICAL HISTORY :   She  has a past medical history of Anxiety, H/O hematuria,  History of colon polyps, Hyperlipidemia, and Hypertension.  PAST SURGICAL HISTORY:  She  has a past surgical history that includes Appendectomy (8/07); Dilation and curettage of uterus; Tonsillectomy; and Eye surgery.  Allergies  Allergen Reactions  . Norvasc [Amlodipine Besylate] Swelling    10mg  dose causes swelling (07/19/2015).  Tolerates 5mg  with no swelling.    No current facility-administered medications on file prior to encounter.   Current Outpatient Medications on File Prior to Encounter  Medication Sig  . aspirin EC 81 MG tablet Take 81 mg by mouth daily.  . Calcium Carbonate-Vit D-Min (CALCIUM 1200 PO) Take by mouth. In divided doses  . Cholecalciferol (SM VITAMIN D3) 4000 UNITS CAPS Take 1 capsule (4,000 Units total) by mouth daily.  . Ibuprofen-diphenhydrAMINE HCl (ADVIL PM) 200-25 MG CAPS Take 1 tablet by mouth daily as needed (For pain).  Marland Kitchen lisinopril (ZESTRIL) 40 MG tablet TAKE 1 TABLET(40 MG) BY MOUTH DAILY (Patient taking differently: Take 40 mg by mouth daily. )  . meloxicam (MOBIC) 7.5 MG tablet TAKE 1 TABLET(7.5 MG) BY MOUTH DAILY (Patient taking differently: Take 7.5 mg by mouth daily. )  . nebivolol (BYSTOLIC) 5 MG tablet TAKE 1  TABLET(5 MG) BY MOUTH DAILY (Patient taking differently: Take 5 mg by mouth daily. )  . omeprazole (PRILOSEC) 20 MG capsule TAKE 1 CAPSULE(20 MG) BY MOUTH DAILY (Patient taking differently: Take 20 mg by mouth daily. )  . ondansetron (ZOFRAN) 4 MG tablet Take 1 tablet (4 mg total) by mouth every 8 (eight) hours as needed for nausea or vomiting.  . rosuvastatin (CRESTOR) 20 MG tablet TAKE 1 TABLET(20 MG) BY MOUTH DAILY (Patient taking differently: Take 20 mg by mouth daily. )  . sertraline (ZOLOFT) 50 MG tablet TAKE 1 TABLET(50 MG) BY MOUTH DAILY (Patient taking differently: Take 50 mg by mouth daily. )  . benzonatate (TESSALON) 100 MG capsule Take 1 capsule (100 mg total) by mouth 3 (three) times daily as needed for cough.    FAMILY HISTORY:   Her family history includes Colon cancer in her sister.  SOCIAL HISTORY:  She  reports that she quit smoking about 15 years ago. Her smoking use included cigarettes. She has a 30.00 pack-year smoking history. She has never used smokeless tobacco. She reports current alcohol use. She reports that she does not use drugs.  REVIEW OF SYSTEMS:    Denies pain, headache, abdominal discomfort, nausea

## 2019-09-16 NOTE — Progress Notes (Signed)
Subjective: Patient is alert and oriented.  Shivering in bed with no complaints  Objective: Current vital signs: BP (!) 216/96   Pulse 88   Temp 99.1 F (37.3 C)   Resp 18   Ht '5\' 6"'  (1.676 m)   Wt 79 kg   SpO2 99%   BMI 28.11 kg/m  Vital signs in last 24 hours: Temp:  [98.6 F (37 C)-101.1 F (38.4 C)] 99.1 F (37.3 C) (01/14 1100) Pulse Rate:  [54-154] 88 (01/14 0332) Resp:  [10-30] 18 (01/14 1100) BP: (57-216)/(38-126) 216/96 (01/14 1100) SpO2:  [3 %-100 %] 99 % (01/14 1119) FiO2 (%):  [40 %-100 %] 40 % (01/14 1030) Weight:  [77 kg-79 kg] 79 kg (01/14 0400)  Intake/Output from previous day: 01/13 0701 - 01/14 0700 In: 3860.2 [I.V.:758.3; IV Piggyback:3101.9] Out: 620 [Urine:620] Intake/Output this shift: Total I/O In: 10 [I.V.:10] Out: -  Nutritional status:  Diet Order            Diet NPO time specified  Diet effective now              Neurologic Exam: Mentation: Patient is alert, oriented to hospital, oriented to month and city.  Able to follow simple commands without difficulty.  Able to repeat words and also name objects such as my thumb, pinky, and tell me my glove is blue Cranial nerves: II intact-III/IV/VI-EOMI, PERRLA, no ptosis, face is symmetrical, facial sensation intact Motor: Moving all extremities 4/5 Sensation: Intact throughout   Lab Results: Results for orders placed or performed during the hospital encounter of 09/15/19 (from the past 48 hour(s))  CBG monitoring, ED     Status: Abnormal   Collection Time: 09/15/19  1:12 PM  Result Value Ref Range   Glucose-Capillary 188 (H) 70 - 99 mg/dL  CBC with Differential     Status: Abnormal   Collection Time: 09/15/19  1:22 PM  Result Value Ref Range   WBC 32.1 (H) 4.0 - 10.5 K/uL   RBC 5.26 (H) 3.87 - 5.11 MIL/uL   Hemoglobin 14.1 12.0 - 15.0 g/dL   HCT 45.1 36.0 - 46.0 %   MCV 85.7 80.0 - 100.0 fL   MCH 26.8 26.0 - 34.0 pg   MCHC 31.3 30.0 - 36.0 g/dL   RDW 15.3 11.5 - 15.5 %    Platelets 470 (H) 150 - 400 K/uL   nRBC 0.0 0.0 - 0.2 %   Neutrophils Relative % 90 %   Neutro Abs 28.9 (H) 1.7 - 7.7 K/uL   Lymphocytes Relative 5 %   Lymphs Abs 1.6 0.7 - 4.0 K/uL   Monocytes Relative 4 %   Monocytes Absolute 1.3 (H) 0.1 - 1.0 K/uL   Eosinophils Relative 0 %   Eosinophils Absolute 0.0 0.0 - 0.5 K/uL   Basophils Relative 0 %   Basophils Absolute 0.0 0.0 - 0.1 K/uL   nRBC 0 0 /100 WBC   Myelocytes 1 %   Abs Immature Granulocytes 0.30 (H) 0.00 - 0.07 K/uL    Comment: Performed at [facility], [address] [place], [place] [address]  Comprehensive metabolic panel     Status: Abnormal   Collection Time: 09/15/19  1:22 PM  Result Value Ref Range   Sodium 139 135 - 145 mmol/L   Potassium 3.7 3.5 - 5.1 mmol/L   Chloride 102 98 - 111 mmol/L   CO2 17 (L) 22 - 32 mmol/L   Glucose, Bld 211 (H) 70 - 99 mg/dL  BUN 20 8 - 23 mg/dL   Creatinine, Ser 1.74 (H) 0.44 - 1.00 mg/dL   Calcium 9.5 8.9 - 10.3 mg/dL   Total Protein 7.7 6.5 - 8.1 g/dL   Albumin 4.0 3.5 - 5.0 g/dL   AST 35 15 - 41 U/L   ALT 20 0 - 44 U/L   Alkaline Phosphatase 88 38 - 126 U/L   Total Bilirubin 0.9 0.3 - 1.2 mg/dL   GFR calc non Af Amer 28 (L) >60 mL/min   GFR calc Af Amer 33 (L) >60 mL/min   Anion gap 20 (H) 5 - 15    Comment: Performed at [facility], [place] [address] [place], [place] [address]  Respiratory Panel by RT PCR (Flu A&B, Covid) - Nasopharyngeal Swab     Status: None   Collection Time: 09/15/19  1:22 PM   Specimen: Nasopharyngeal Swab  Result Value Ref Range   SARS Coronavirus 2 by RT PCR NEGATIVE NEGATIVE    Comment: (NOTE) SARS-CoV-2 target nucleic acids are NOT DETECTED. The SARS-CoV-2 RNA is generally detectable in upper respiratoy specimens during the acute phase of infection. The lowest concentration of SARS-CoV-2 viral copies this assay can detect is 131 copies/mL. A negative result does not preclude SARS-Cov-2 infection and should not be used as the sole  basis for treatment or other patient management decisions. A negative result may occur with  improper specimen collection/handling, submission of specimen other than nasopharyngeal swab, presence of viral mutation(s) within the areas targeted by this assay, and inadequate number of viral copies (<131 copies/mL). A negative result must be combined with clinical observations, patient history, and epidemiological information. The expected result is Negative. Fact Sheet for Patients:  PinkCheek.be Fact Sheet for Healthcare Providers:  GravelBags.it This test is not yet ap proved or cleared by the [place] FDA and  has been authorized for detection and/or diagnosis of SARS-CoV-2 by FDA under an Emergency Use Authorization (EUA). This EUA will remain  in effect (meaning this test can be used) for the duration of the COVID-19 declaration under Section 564(b)(1) of the Act, 21 U.S.C. section 360bbb-3(b)(1), unless the authorization is terminated or revoked sooner.    Influenza A by PCR NEGATIVE NEGATIVE   Influenza B by PCR NEGATIVE NEGATIVE    Comment: (NOTE) The Xpert Xpress SARS-CoV-2/FLU/RSV assay is intended as an aid in  the diagnosis of influenza from Nasopharyngeal swab specimens and  should not be used as a sole basis for treatment. Nasal washings and  aspirates are unacceptable for Xpert Xpress SARS-CoV-2/FLU/RSV  testing. Fact Sheet for Patients: PinkCheek.be Fact Sheet for Healthcare Providers: GravelBags.it This test is not yet approved or cleared by the [place] FDA and  has been authorized for detection and/or diagnosis of SARS-CoV-2 by  FDA under an Emergency Use Authorization (EUA). This EUA will remain  in effect (meaning this test can be used) for the duration of the  Covid-19 declaration under Section 564(b)(1) of the Act, 21  U.S.C. section  360bbb-3(b)(1), unless the authorization is  terminated or revoked. Performed at [facility], [place] [address] [place], [place] [address]   Lactic acid, plasma     Status: Abnormal   Collection Time: 09/15/19  1:22 PM  Result Value Ref Range   Lactic Acid, Venous 6.5 (HH) 0.5 - 1.9 mmol/L    Comment: CRITICAL RESULT CALLED TO, READ BACK BY AND VERIFIED WITH: Orson Ape [id number] 09/15/2019 WBOND Performed at [facility], [address] [place], [place] [address]   Urinalysis, Routine w reflex microscopic     Status: Abnormal   Collection Time: 09/15/19  2:50 PM  Result Value Ref Range   Color, Urine AMBER (A) YELLOW    Comment: BIOCHEMICALS MAY BE AFFECTED BY COLOR   APPearance CLOUDY (A) CLEAR   Specific Gravity, Urine >1.046 (H) 1.005 - 1.030   pH 5.0 5.0 - 8.0   Glucose, UA >=500 (A) NEGATIVE mg/dL   Hgb urine dipstick SMALL (A) NEGATIVE   Bilirubin Urine NEGATIVE NEGATIVE   Ketones, ur NEGATIVE NEGATIVE mg/dL   Protein, ur >=300 (A) NEGATIVE mg/dL   Nitrite NEGATIVE NEGATIVE   Leukocytes,Ua NEGATIVE NEGATIVE   RBC / HPF 6-10 0 - 5 RBC/hpf   WBC, UA 0-5 0 - 5 WBC/hpf   Bacteria, UA MANY (A) NONE SEEN   Squamous Epithelial / LPF 6-10 0 - 5   Mucus PRESENT    Hyaline Casts, UA PRESENT     Comment: Performed at [facility], [address] [place], [place] [address]  Urine culture     Status: None   Collection Time: 09/15/19  2:50 PM   Specimen: Urine, Random  Result Value Ref Range   Specimen Description URINE, RANDOM    Special Requests NONE    Culture      NO GROWTH Performed at [facility], [place] [address], [place], [place] [address]    Report Status 09/16/2019 FINAL   Blood culture (routine x 2)     Status: None (Preliminary result)   Collection Time: 09/15/19  4:05 PM   Specimen: BLOOD LEFT WRIST  Result Value Ref Range   Specimen Description BLOOD LEFT WRIST    Special Requests      BOTTLES DRAWN AEROBIC AND ANAEROBIC Blood Culture  results may not be optimal due to an inadequate volume of blood received in culture bottles   Culture      NO GROWTH < 24 HOURS Performed at [facility] [address], [place], [place] [address]    Report Status PENDING   I-STAT 7, (LYTES, BLD GAS, ICA, H+H)     Status: Abnormal   Collection Time: 09/15/19  4:20 PM  Result Value Ref Range   pH, Arterial 7.303 (L) 7.350 - 7.450   pCO2 arterial 38.1 32.0 - 48.0 mmHg   pO2, Arterial 287.0 (H) 83.0 - 108.0 mmHg   Bicarbonate 18.9 (L) 20.0 - 28.0 mmol/L   TCO2 20 (L) 22 - 32 mmol/L   O2 Saturation 100.0 %   Acid-base deficit 7.0 (H) 0.0 - 2.0 mmol/L   Sodium 142 135 - 145 mmol/L   Potassium 3.3 (L) 3.5 - 5.1 mmol/L   Calcium, Ion 1.13 (L) 1.15 - 1.40 mmol/L   HCT 33.0 (L) 36.0 - 46.0 %   Hemoglobin 11.2 (L) 12.0 - 15.0 g/dL   Patient temperature HIDE    Sample type ARTERIAL   Lactic acid, plasma     Status: Abnormal   Collection Time: 09/15/19  4:32 PM  Result Value Ref Range   Lactic Acid, Venous 2.6 (HH) 0.5 - 1.9 mmol/L    Comment: CRITICAL VALUE NOTED.  VALUE IS CONSISTENT WITH PREVIOUSLY REPORTED AND CALLED VALUE. Performed at [facility], [place] [address], [place], [place] [address]   Triglycerides     Status: Abnormal   Collection Time: 09/15/19  4:32 PM  Result Value Ref Range   Triglycerides 283 (H) <  150 mg/dL    Comment: Performed at [facility], [place] [address], [place], [place] [address]  Protime-INR     Status: None   Collection Time: 09/15/19  4:32 PM  Result Value Ref Range   Prothrombin Time 14.6 11.4 - 15.2 seconds   INR 1.1 0.8 - 1.2    Comment: (NOTE) INR goal varies based on device and disease states. Performed at [facility], [place] [address] [place], [place] [address]   Blood culture (routine x 2)     Status: None (Preliminary result)   Collection Time: 09/15/19  4:40 PM   Specimen: BLOOD  Result Value Ref Range   Specimen Description BLOOD RIGHT ANTECUBITAL    Special  Requests      BOTTLES DRAWN AEROBIC AND ANAEROBIC Blood Culture adequate volume   Culture      NO GROWTH < 24 HOURS Performed at [facility], [place] [address] [place], [place] [address]    Report Status PENDING   Rapid urine drug screen (hospital performed)     Status: Abnormal   Collection Time: 09/15/19  5:32 PM  Result Value Ref Range   Opiates NONE DETECTED NONE DETECTED   Cocaine NONE DETECTED NONE DETECTED   Benzodiazepines NONE DETECTED NONE DETECTED   Amphetamines NONE DETECTED NONE DETECTED   Tetrahydrocannabinol POSITIVE (A) NONE DETECTED   Barbiturates NONE DETECTED NONE DETECTED    Comment: (NOTE) DRUG SCREEN FOR MEDICAL PURPOSES ONLY.  IF CONFIRMATION IS NEEDED FOR ANY PURPOSE, NOTIFY LAB WITHIN 5 DAYS. LOWEST DETECTABLE LIMITS FOR URINE DRUG SCREEN Drug Class                     Cutoff (ng/mL) Amphetamine and metabolites    1000 Barbiturate and metabolites    200 Benzodiazepine                 440 Tricyclics and metabolites     300 Opiates and metabolites        300 Cocaine and metabolites        300 THC                            50 Performed at [facility], [place] [address] [place], [place] [address]   Acetaminophen level     Status: Abnormal   Collection Time: 09/15/19  5:32 PM  Result Value Ref Range   Acetaminophen (Tylenol), Serum <10 (L) 10 - 30 ug/mL    Comment: (NOTE) Therapeutic concentrations vary significantly. A range of 10-30 ug/mL  may be an effective concentration for many patients. However, some  are best treated at concentrations outside of this range. Acetaminophen concentrations >150 ug/mL at 4 hours after ingestion  and >50 ug/mL at 12 hours after ingestion are often associated with  toxic reactions. Performed at [facility], [place] [address] [place], [place] [address]   Ethanol     Status: None   Collection Time: 09/15/19  5:32 PM  Result Value Ref Range   Alcohol, Ethyl (B) <10 <10 mg/dL    Comment:  (NOTE) Lowest detectable limit for serum alcohol is 10 mg/dL. For medical purposes only. Performed at [facility], [place] [address] [place], [place] [address]   .Cooxemetry Panel (carboxy, met, total hgb, O2 sat)     Status: Abnormal   Collection Time: 09/15/19  5:32 PM  Result Value Ref Range  Total hemoglobin 11.8 (L) 12.0 - 16.0 g/dL   O2 Saturation 65.6 %   Carboxyhemoglobin 0.7 0.5 - 1.5 %   Methemoglobin 1.2 0.0 - 1.5 %    Comment: Performed at [facility] [address] [place], [place] [address]  Hemoglobin A1c     Status: Abnormal   Collection Time: 09/15/19  5:32 PM  Result Value Ref Range   Hgb A1c MFr Bld 6.0 (H) 4.8 - 5.6 %    Comment: (NOTE) Pre diabetes:          5.7%-6.4% Diabetes:              >6.4% Glycemic control for   <7.0% adults with diabetes    Mean Plasma Glucose 125.5 mg/dL    Comment: Performed at [facility] [address] [place], [place] [address]  CK     Status: Abnormal   Collection Time: 09/15/19  5:32 PM  Result Value Ref Range   Total CK 920 (H) 38 - 234 U/L    Comment: Performed at [facility], [place] [address] [place] [address], [place], [place] [address]  CSF cell count with differential collection tube #: 1     Status: Abnormal   Collection Time: 09/15/19  6:20 PM  Result Value Ref Range   Tube # 1    Color, CSF PINK (A) COLORLESS   Appearance, CSF CLOUDY (A) CLEAR   Supernatant NOT INDICATED    RBC Count, CSF 3,250 (H) 0 /cu mm   WBC, CSF 8 (H) 0 - 5 /cu mm   Segmented Neutrophils-CSF 73 (H) 0 - 6 %   Lymphs, CSF 19 (L) 40 - 80 %   Monocyte-Macrophage-Spinal Fluid 8 (L) 15 - 45 %   Eosinophils, CSF 0 0 - 1 %    Comment: Performed at [facility] [address], [place], [place] [address]  CSF cell count with differential     Status: Abnormal   Collection Time: 09/15/19  6:20 PM  Result Value Ref Range   Tube # 4    Color, CSF COLORLESS COLORLESS   Appearance, CSF CLEAR CLEAR   Supernatant NOT INDICATED     RBC Count, CSF 335 (H) 0 /cu mm   WBC, CSF 6 (H) 0 - 5 /cu mm   Segmented Neutrophils-CSF 80 (H) 0 - 6 %   Lymphs, CSF 11 (L) 40 - 80 %   Monocyte-Macrophage-Spinal Fluid 9 (L) 15 - 45 %   Eosinophils, CSF 0 0 - 1 %    Comment: Performed at [facility] [address], [place], [place] [address]  CSF culture     Status: None (Preliminary result)   Collection Time: 09/15/19  6:20 PM   Specimen: CSF; Cerebrospinal Fluid  Result Value Ref Range   Specimen Description CSF    Special Requests NONE    Gram Stain      WBC PRESENT,BOTH PMN AND MONONUCLEAR NO ORGANISMS SEEN CYTOSPIN SMEAR    Culture      NO GROWTH < 24 HOURS Performed at [facility], [place] [address] [place], [place] [address]    Report Status PENDING   Protein and glucose, CSF     Status: Abnormal   Collection Time: 09/15/19  6:20 PM  Result Value Ref Range   Glucose, CSF 118 (H) 40 - 70 mg/dL   Total  Protein, CSF 63 (H) 15 - 45 mg/dL    Comment: Performed  at [facility], [place] [address], [place], [place] [address]  Glucose, capillary     Status: None   Collection Time: 09/15/19  7:10 PM  Result Value Ref Range   Glucose-Capillary 93 70 - 99 mg/dL  Glucose, capillary     Status: Abnormal   Collection Time: 09/15/19  8:48 PM  Result Value Ref Range   Glucose-Capillary 129 (H) 70 - 99 mg/dL  I-STAT 7, (LYTES, BLD GAS, ICA, H+H)     Status: Abnormal   Collection Time: 09/15/19  8:56 PM  Result Value Ref Range   pH, Arterial 7.287 (L) 7.350 - 7.450   pCO2 arterial 39.1 32.0 - 48.0 mmHg   pO2, Arterial 125.0 (H) 83.0 - 108.0 mmHg   Bicarbonate 18.5 (L) 20.0 - 28.0 mmol/L   TCO2 20 (L) 22 - 32 mmol/L   O2 Saturation 98.0 %   Acid-base deficit 7.0 (H) 0.0 - 2.0 mmol/L   Sodium 141 135 - 145 mmol/L   Potassium 3.6 3.5 - 5.1 mmol/L   Calcium, Ion 1.15 1.15 - 1.40 mmol/L   HCT 35.0 (L) 36.0 - 46.0 %   Hemoglobin 11.9 (L) 12.0 - 15.0 g/dL   Patient temperature 37.9 C    Collection site RADIAL,  ALLEN'S TEST ACCEPTABLE    Drawn by RT    Sample type ARTERIAL   MRSA PCR Screening     Status: None   Collection Time: 09/15/19  9:02 PM   Specimen: Nasal Mucosa; Nasopharyngeal  Result Value Ref Range   MRSA by PCR NEGATIVE NEGATIVE    Comment:        The GeneXpert MRSA Assay (FDA approved for NASAL specimens only), is one component of a comprehensive MRSA colonization surveillance program. It is not intended to diagnose MRSA infection nor to guide or monitor treatment for MRSA infections. Performed at [facility], [place] [address], [place], [place] [address]   I-STAT 7, (LYTES, BLD GAS, ICA, H+H)     Status: Abnormal   Collection Time: 09/15/19 10:00 PM  Result Value Ref Range   pH, Arterial 7.313 (L) 7.350 - 7.450   pCO2 arterial 35.1 32.0 - 48.0 mmHg   pO2, Arterial 86.0 83.0 - 108.0 mmHg   Bicarbonate 17.7 (L) 20.0 - 28.0 mmol/L   TCO2 19 (L) 22 - 32 mmol/L   O2 Saturation 95.0 %   Acid-base deficit 8.0 (H) 0.0 - 2.0 mmol/L   Sodium 142 135 - 145 mmol/L   Potassium 3.4 (L) 3.5 - 5.1 mmol/L   Calcium, Ion 1.14 (L) 1.15 - 1.40 mmol/L   HCT 31.0 (L) 36.0 - 46.0 %   Hemoglobin 10.5 (L) 12.0 - 15.0 g/dL   Patient temperature 37.7 C    Collection site RADIAL, ALLEN'S TEST ACCEPTABLE    Drawn by RT    Sample type ARTERIAL   Basic metabolic panel     Status: Abnormal   Collection Time: 09/15/19 10:30 PM  Result Value Ref Range   Sodium 141 135 - 145 mmol/L   Potassium 3.7 3.5 - 5.1 mmol/L   Chloride 112 (H) 98 - 111 mmol/L   CO2 19 (L) 22 - 32 mmol/L   Glucose, Bld 105 (H) 70 - 99 mg/dL   BUN 21 8 - 23 mg/dL   Creatinine, Ser 1.68 (H) 0.44 - 1.00 mg/dL   Calcium 7.5 (L) 8.9 - 10.3 mg/dL   GFR calc non Af Amer 30 (L) >60 mL/min   GFR calc  Af Amer 34 (L) >60 mL/min   Anion gap 10 5 - 15    Comment: Performed at [facility] [address] [place], [place] [address]  Lactic acid, plasma     Status: None   Collection Time: 09/15/19 10:30 PM  Result Value  Ref Range   Lactic Acid, Venous 1.9 0.5 - 1.9 mmol/L    Comment: Performed at [facility] [address], [place], [place] [address]  Glucose, capillary     Status: None   Collection Time: 09/15/19 11:08 PM  Result Value Ref Range   Glucose-Capillary 95 70 - 99 mg/dL  Glucose, capillary     Status: Abnormal   Collection Time: 09/16/19  3:06 AM  Result Value Ref Range   Glucose-Capillary 119 (H) 70 - 99 mg/dL  CBC     Status: Abnormal   Collection Time: 09/16/19  3:11 AM  Result Value Ref Range   WBC 19.4 (H) 4.0 - 10.5 K/uL   RBC 5.06 3.87 - 5.11 MIL/uL   Hemoglobin 13.5 12.0 - 15.0 g/dL   HCT 43.1 36.0 - 46.0 %   MCV 85.2 80.0 - 100.0 fL   MCH 26.7 26.0 - 34.0 pg   MCHC 31.3 30.0 - 36.0 g/dL   RDW 15.8 (H) 11.5 - 15.5 %   Platelets 296 150 - 400 K/uL   nRBC 0.0 0.0 - 0.2 %    Comment: Performed at [facility], [place] [address] [place], [place] [address]  Basic metabolic panel     Status: Abnormal   Collection Time: 09/16/19  3:11 AM  Result Value Ref Range   Sodium 141 135 - 145 mmol/L   Potassium 3.9 3.5 - 5.1 mmol/L   Chloride 111 98 - 111 mmol/L   CO2 17 (L) 22 - 32 mmol/L   Glucose, Bld 116 (H) 70 - 99 mg/dL   BUN 23 8 - 23 mg/dL   Creatinine, Ser 1.66 (H) 0.44 - 1.00 mg/dL   Calcium 8.0 (L) 8.9 - 10.3 mg/dL   GFR calc non Af Amer 30 (L) >60 mL/min   GFR calc Af Amer 35 (L) >60 mL/min   Anion gap 13 5 - 15    Comment: Performed at [facility] [address], [place], [place] [address]  Magnesium     Status: None   Collection Time: 09/16/19  3:11 AM  Result Value Ref Range   Magnesium 1.7 1.7 - 2.4 mg/dL    Comment: Performed at [facility] [address] [place], [place] [address]  Phosphorus     Status: None   Collection Time: 09/16/19  3:11 AM  Result Value Ref Range   Phosphorus 3.8 2.5 - 4.6 mg/dL    Comment: Performed at [facility] [address], [place], [place] [address]  I-STAT 7, (LYTES, BLD GAS, ICA, H+H)      Status: Abnormal   Collection Time: 09/16/19  3:39 AM  Result Value Ref Range   pH, Arterial 7.291 (L) 7.350 - 7.450   pCO2 arterial 37.1 32.0 - 48.0 mmHg   pO2, Arterial 99.0 83.0 - 108.0 mmHg   Bicarbonate 17.8 (L) 20.0 - 28.0 mmol/L   TCO2 19 (L) 22 - 32 mmol/L   O2 Saturation 97.0 %   Acid-base deficit 8.0 (H) 0.0 - 2.0 mmol/L   Sodium 141 135 - 145 mmol/L   Potassium 3.4 (L) 3.5 - 5.1 mmol/L  Calcium, Ion 1.17 1.15 - 1.40 mmol/L   HCT 34.0 (L) 36.0 - 46.0 %   Hemoglobin 11.6 (L) 12.0 - 15.0 g/dL   Patient temperature 37.5 C    Collection site RADIAL, ALLEN'S TEST ACCEPTABLE    Drawn by RT    Sample type ARTERIAL   Glucose, capillary     Status: None   Collection Time: 09/16/19  8:06 AM  Result Value Ref Range   Glucose-Capillary 96 70 - 99 mg/dL  Glucose, capillary     Status: Abnormal   Collection Time: 09/16/19 11:39 AM  Result Value Ref Range   Glucose-Capillary 113 (H) 70 - 99 mg/dL    Recent Results (from the past 240 hour(s))  Respiratory Panel by RT PCR (Flu A&B, Covid) - Nasopharyngeal Swab     Status: None   Collection Time: 09/15/19  1:22 PM   Specimen: Nasopharyngeal Swab  Result Value Ref Range Status   SARS Coronavirus 2 by RT PCR NEGATIVE NEGATIVE Final    Comment: (NOTE) SARS-CoV-2 target nucleic acids are NOT DETECTED. The SARS-CoV-2 RNA is generally detectable in upper respiratoy specimens during the acute phase of infection. The lowest concentration of SARS-CoV-2 viral copies this assay can detect is 131 copies/mL. A negative result does not preclude SARS-Cov-2 infection and should not be used as the sole basis for treatment or other patient management decisions. A negative result may occur with  improper specimen collection/handling, submission of specimen other than nasopharyngeal swab, presence of viral mutation(s) within the areas targeted by this assay, and inadequate number of viral copies (<131 copies/mL). A negative result must be combined  with clinical observations, patient history, and epidemiological information. The expected result is Negative. Fact Sheet for Patients:  PinkCheek.be Fact Sheet for Healthcare Providers:  GravelBags.it This test is not yet ap proved or cleared by the [place] FDA and  has been authorized for detection and/or diagnosis of SARS-CoV-2 by FDA under an Emergency Use Authorization (EUA). This EUA will remain  in effect (meaning this test can be used) for the duration of the COVID-19 declaration under Section 564(b)(1) of the Act, 21 U.S.C. section 360bbb-3(b)(1), unless the authorization is terminated or revoked sooner.    Influenza A by PCR NEGATIVE NEGATIVE Final   Influenza B by PCR NEGATIVE NEGATIVE Final    Comment: (NOTE) The Xpert Xpress SARS-CoV-2/FLU/RSV assay is intended as an aid in  the diagnosis of influenza from Nasopharyngeal swab specimens and  should not be used as a sole basis for treatment. Nasal washings and  aspirates are unacceptable for Xpert Xpress SARS-CoV-2/FLU/RSV  testing. Fact Sheet for Patients: PinkCheek.be Fact Sheet for Healthcare Providers: GravelBags.it This test is not yet approved or cleared by the [place] FDA and  has been authorized for detection and/or diagnosis of SARS-CoV-2 by  FDA under an Emergency Use Authorization (EUA). This EUA will remain  in effect (meaning this test can be used) for the duration of the  Covid-19 declaration under Section 564(b)(1) of the Act, 21  U.S.C. section 360bbb-3(b)(1), unless the authorization is  terminated or revoked. Performed at [facility], [place] [address] [place], [place] [address]   Urine culture     Status: None   Collection Time: 09/15/19  2:50 PM   Specimen: Urine, Random  Result Value Ref Range Status   Specimen Description URINE, RANDOM  Final   Special Requests  NONE  Final   Culture   Final    NO GROWTH Performed  at [facility], [place] [address] [place], [place] [address]    Report Status 09/16/2019 FINAL  Final  Blood culture (routine x 2)     Status: None (Preliminary result)   Collection Time: 09/15/19  4:05 PM   Specimen: BLOOD LEFT WRIST  Result Value Ref Range Status   Specimen Description BLOOD LEFT WRIST  Final   Special Requests   Final    BOTTLES DRAWN AEROBIC AND ANAEROBIC Blood Culture results may not be optimal due to an inadequate volume of blood received in culture bottles   Culture   Final    NO GROWTH < 24 HOURS Performed at [facility], [place] [address], [place], [place] [address]    Report Status PENDING  Incomplete  Blood culture (routine x 2)     Status: None (Preliminary result)   Collection Time: 09/15/19  4:40 PM   Specimen: BLOOD  Result Value Ref Range Status   Specimen Description BLOOD RIGHT ANTECUBITAL  Final   Special Requests   Final    BOTTLES DRAWN AEROBIC AND ANAEROBIC Blood Culture adequate volume   Culture   Final    NO GROWTH < 24 HOURS Performed at [facility], [place] [address] [address], [place], [place] [address]    Report Status PENDING  Incomplete  CSF culture     Status: None (Preliminary result)   Collection Time: 09/15/19  6:20 PM   Specimen: CSF; Cerebrospinal Fluid  Result Value Ref Range Status   Specimen Description CSF  Final   Special Requests NONE  Final   Gram Stain   Final    WBC PRESENT,BOTH PMN AND MONONUCLEAR NO ORGANISMS SEEN CYTOSPIN SMEAR    Culture   Final    NO GROWTH < 24 HOURS Performed at [facility], [place] [address], [place], [place] [address]    Report Status PENDING  Incomplete  MRSA PCR Screening     Status: None   Collection Time: 09/15/19  9:02 PM   Specimen: Nasal Mucosa; Nasopharyngeal  Result Value Ref Range Status   MRSA by PCR NEGATIVE NEGATIVE Final    Comment:        The GeneXpert MRSA Assay (FDA approved for NASAL specimens only),  is one component of a comprehensive MRSA colonization surveillance program. It is not intended to diagnose MRSA infection nor to guide or monitor treatment for MRSA infections. Performed at [facility], [place] [address] [place], [place] [address]     Lipid Panel Recent Labs    09/15/19 1632  TRIG 283*    Studies/Results: DG Wrist 2 Views Left  Result Date: 09/15/2019 CLINICAL DATA:  Found down in shower, confused, disoriented EXAM: LEFT WRIST - 2 VIEW COMPARISON:  None. FINDINGS: No fracture or dislocation of the left wrist. The carpus is normally aligned. Mild arthrosis. Soft tissues are unremarkable. IMPRESSION: No fracture or dislocation of the left wrist. The carpus is normally aligned. Electronically Signed   By: Eddie Candle M.D.   On: 09/15/2019 14:32   DG Abd 1 View  Result Date: 09/16/2019 CLINICAL DATA:  74 year old female enteric tube placement. EXAM: ABDOMEN - 1 VIEW COMPARISON:  None. FINDINGS: Portable AP semi upright view at 0318 hours. Enteric tube courses into the stomach with side hole at the level of the gastric body. Mild streaky opacity at the left lung base. Visible right lung base appears negative. Visualized bowel gas pattern is non obstructed. Scoliosis. No acute osseous abnormality  identified. IMPRESSION: Enteric tube placed into the stomach with side hole at the level of the gastric body. Electronically Signed   By: Genevie Ann M.D.   On: 09/16/2019 03:50   CT Head Wo Contrast  Result Date: 09/15/2019 CLINICAL DATA:  Found down in the shower.  Altered mental status. EXAM: CT HEAD WITHOUT CONTRAST CT CERVICAL SPINE WITHOUT CONTRAST TECHNIQUE: Multidetector CT imaging of the head and cervical spine was performed following the standard protocol without intravenous contrast. Multiplanar CT image reconstructions of the cervical spine were also generated. COMPARISON:  MR brain and CT head dated December 11, 2011. FINDINGS: CT HEAD FINDINGS Brain: No evidence of acute  infarction, hemorrhage, hydrocephalus, extra-axial collection or mass lesion/mass effect. Stable mild chronic microvascular ischemic changes. Vascular: No hyperdense vessel or unexpected calcification. Skull: Normal. Negative for fracture or focal lesion. Sinuses/Orbits: Pansinus mucosal thickening. Air-fluid level in the right maxillary sinus. The mastoid air cells are clear. The orbits are unremarkable. Other: None. CT CERVICAL SPINE FINDINGS Alignment: Normal. Skull base and vertebrae: No acute fracture. No primary bone lesion or focal pathologic process. 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 Soft tissues and spinal canal: No prevertebral fluid or swelling. No visible canal hematoma. Disc levels: Severe disc height loss and advanced uncovertebral hypertrophy from C4-C5 through C6-C7. Upper chest: Mild emphysema. Other: None. IMPRESSION: 1.  No acute intracranial abnormality. 2.  No acute cervical spine fracture. Electronically Signed   By: Titus Dubin M.D.   On: 09/15/2019 13:12   CT Cervical Spine Wo Contrast  Result Date: 09/15/2019 CLINICAL DATA:  Found down in the shower.  Altered mental status. EXAM: CT HEAD WITHOUT CONTRAST CT CERVICAL SPINE WITHOUT CONTRAST TECHNIQUE: Multidetector CT imaging of the head and cervical spine was performed following the standard protocol without intravenous contrast. Multiplanar CT image reconstructions of the cervical spine were also generated. COMPARISON:  MR brain and CT head dated December 11, 2011. FINDINGS: CT HEAD FINDINGS Brain: No evidence of acute infarction, hemorrhage, hydrocephalus, extra-axial collection or mass lesion/mass effect. Stable mild chronic microvascular ischemic changes. Vascular: No hyperdense vessel or unexpected calcification. Skull: Normal. Negative for fracture or focal lesion. Sinuses/Orbits: Pansinus mucosal thickening. Air-fluid level in the right maxillary sinus. The mastoid air cells are clear. The orbits are  unremarkable. Other: None. CT CERVICAL SPINE FINDINGS Alignment: Normal. Skull base and vertebrae: No acute fracture. No primary bone lesion or focal pathologic process. 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 Soft tissues and spinal canal: No prevertebral fluid or swelling. No visible canal hematoma. Disc levels: Severe disc height loss and advanced uncovertebral hypertrophy from C4-C5 through C6-C7. Upper chest: Mild emphysema. Other: None. IMPRESSION: 1.  No acute intracranial abnormality. 2.  No acute cervical spine fracture. Electronically Signed   By: Titus Dubin M.D.   On: 09/15/2019 13:12   US RENAL  Result Date: 09/15/2019 CLINICAL DATA:  AKI EXAM: RENAL / URINARY TRACT ULTRASOUND COMPLETE COMPARISON:  None. FINDINGS: Right Kidney: Renal measurements: 10 x 4.4 x 4.1 cm = volume: 92.6 mL . Echogenicity within normal limits. No mass or hydronephrosis visualized. Left Kidney: Renal measurements: 10.3 x 5.4 x 3.5 cm = volume: 101.9 mL. Echogenicity within normal limits. No mass or hydronephrosis visualized. Bladder: Decompressed by Foley catheter and therefore poorly assessed by sonography. Other: Patient is intubated at the time of exam with inability to reposition patient. IMPRESSION: Unremarkable appearance of the kidneys. Bladder is decompressed by Foley catheter. Technically difficult exam secondary to inability  to reposition this intubated patient. Electronically Signed   By: Lovena Le M.D.   On: 09/15/2019 19:36   DG Pelvis Portable  Result Date: 09/15/2019 CLINICAL DATA:  Pain. The patient was found down. EXAM: PORTABLE PELVIS 1-2 VIEWS COMPARISON:  None. FINDINGS: There is no evidence of pelvic fracture or diastasis. No pelvic bone lesions are seen. Degenerative changes in the lower lumbar spine. IMPRESSION: Normal appearing pelvis. Degenerative changes in the lower lumbar spine. Electronically Signed   By: Lorriane Shire M.D.   On: 09/15/2019 14:34   DG Chest Port 1  View  Result Date: 09/16/2019 CLINICAL DATA:  74 year old female intubated after found down. EXAM: PORTABLE CHEST 1 VIEW COMPARISON:  09/15/2019 portable chest. FINDINGS: Portable AP upright view at 0322 hours. Endotracheal tube tip in good position between the level the clavicles and carina. Enteric tube courses to the abdomen. Mildly lower lung volumes. Streaky left lung base opacity is new and most resembles atelectasis. Elsewhere lungs remain clear allowing for portable technique. Normal cardiac size and mediastinal contours. No acute osseous abnormality identified. IMPRESSION: 1.  Stable lines and tubes. 2. Left lung base atelectasis. No other acute cardiopulmonary abnormality. Electronically Signed   By: Genevie Ann M.D.   On: 09/16/2019 03:52   DG Chest Portable 1 View  Result Date: 09/15/2019 CLINICAL DATA:  Altered level of consciousness. Patient was found down. EXAM: PORTABLE CHEST 1 VIEW COMPARISON:  04/08/2006 FINDINGS: Endotracheal tube in good position 3 cm above the carina. NG tube tip below the diaphragm. Heart size and pulmonary vascularity are normal. Lungs are clear. No bone abnormality. IMPRESSION: Endotracheal tube in good position. Lungs are clear. Electronically Signed   By: Lorriane Shire M.D.   On: 09/15/2019 14:33   EEG adult  Result Date: 09/15/2019 Lora Havens, MD     09/15/2019  8:21 PM Patient Name: JOANE POSTEL MRN: [id number] Epilepsy Attending: Lora Havens Referring Physician/Provider: Dr Amie [place] Date: 09/15/2019 Duration: 21.23 mins Patient history: 74yo F with ams. EEG to evaluate for seizure Level of alertness: comatose AEDs during EEG study: None Technical aspects: This EEG study was done with scalp electrodes positioned according to the 10-20 International system of electrode placement. Electrical activity was acquired at a sampling rate of '500Hz'  and reviewed with a high frequency filter of '70Hz'  and a low frequency filter of '1Hz' . EEG data were recorded  continuously and digitally stored. DESCRIPTION: EEG showed continuous generalized background attenuation. EEG was not reactive to tactile stimulation. Hyperventilation and photic stimulation were not performed. ABNORMALITY - Background attenuation, generalized IMPRESSION: This study is suggestive of profound diffuse encephalopathy, non specific to etiology.  No seizures or epileptiform discharges were seen throughout the recording. Priyanka Barbra Sarks    Medications:  Scheduled: . amLODipine  5 mg Oral Daily  . bisoprolol  5 mg Oral Daily  . Chlorhexidine Gluconate Cloth  6 each Topical Daily  . ciprofloxacin  2 drop Both Eyes Q4H while awake  . enoxaparin (LOVENOX) injection  30 mg Subcutaneous Q24H  . insulin aspart  0-24 Units Subcutaneous Q4H  . ipratropium-albuterol  3 mL Nebulization Q6H  . ipratropium-albuterol      . [START ON 09/17/2019] pantoprazole  40 mg Oral Daily   Continuous: . sodium chloride 10 mL/hr at 09/16/19 0600  . cefTRIAXone (ROCEPHIN)  IV Stopped (09/16/19 0440)  . potassium chloride 10 mEq (09/16/19 1143)   LP- HSV by PCR negative Glucose 118 Protein 63 Culture negative Red blood cells  335, white blood cell 6, neutrophils 80, lymphocytes 11, monocytes 9  EEG-this study is suggestive of profound diffuse encephalopathy, non specific to etiology.  No seizures or epileptiform discharges were seen throughout the recording.  Assessment: 74 year old female admitted to the intensive care unit for evaluation of altered mental status.  -- Initially found down in her shower confused after being checked on for not showing up to work. On today's exam, the patient's mental status is significantly improved relative to prior EDP assessment on patient's arrival yesterday.  -- Leukocytosis was 32K on initial labs, now improved to 20K. Infection likely contributed to her AMS.  -- AKI on CKD. Metabolic disturbance also likely to be a contributing factor.  -- No meningismus on  exam. -- LP does not reveal etiology for encephalopathy. HSV PCR negative. Borderline elevation of WBC most likely due to peripheral WBC from bloody tap. Protein elevation is mild and also most likely secondary to bloody tap.  -- EEG yesterday was consistent with a diffuse encephalopathy. No electrographic seizures were noted.  -- Overall clinical picture most consistent with a resolving multifactorial toxic/metabolic/infectious encephalopathy.    Recommendations: -- Continue to treat toxic metabolic issues -- Supportive care per primary team -- Agree with discontinuing acyclovir  -- Can wait for CSF cultures to discontinue meningitis-dose antibiotics. Alternatively, given the minimal elevation of CSF WBC and slight elevation of CSF protein, both most likely secondary to contamination from bloody tap, could obtain second opinion from ID and perhaps D/C ABX without culture results if ID opinion also is that her presentation is not c/w a meningitis -- Neurology will sign off.  Please call if there are additional questions.    LOS: 1 day   '@Electronically'  signed: Dr. Kerney Elbe 09/16/2019  11:57 AM

## 2019-09-16 NOTE — Evaluation (Signed)
Physical Therapy Evaluation Patient Details Name: Sherry Kent MRN: [id number] DOB: 15-May-1946 Today's Date: 09/16/2019   History of Present Illness  Sherry Kent is a 74 year old female with a past medical history notable for CKD, HTN, HLD, impaired glucose intolerance, who was brought to the ED via EMS for AMS. Patient agitated and placed on vent for brain imaging, thought to be due to UTI with treatment and some confusion earlier in the week.  Clinical Impression  Patient presents with decreased mobility due to weakness, tremulous, decreased activity tolerance with N&V and elevated BP as well as decreased cognition.  Currently able to tolerate in bed strength assessment only as with N&V after coughing during attempts at in bed mobility.  Feel she will benefit from skilled PT In the acute setting to allow return home with family support if adequate or will consider CIR if needed.      Follow Up Recommendations Supervision/Assistance - 24 hour;Home health PT    Equipment Recommendations  Other (comment)(to be assessed when can tolerate OOB mobility)    Recommendations for Other Services       Precautions / Restrictions Precautions Precautions: Fall Precaution Comments: watch BP, watch for N&V Restrictions Weight Bearing Restrictions: No      Mobility  Bed Mobility               General bed mobility comments: initiated up to EOB, but pt with N&V so terminated and RN made aware  Transfers                    Ambulation/Gait                Stairs            Wheelchair Mobility    Modified Rankin (Stroke Patients Only)       Balance       Sitting balance - Comments: NT due to N&V                                     Pertinent Vitals/Pain Pain Assessment: No/denies pain    Home Living Family/patient expects to be discharged to:: Private residence Living Arrangements: Alone Available Help at Discharge: Family;Available 24  hours/day(neice can help per pt) Type of Home: Apartment Home Access: Level entry     Home Layout: One level Home Equipment: None 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[employer]    Prior Function Level of Independence: Independent               Journalist, newspaper        Extremity/Trunk Assessment   Upper Extremity Assessment Upper Extremity Assessment: Generalized weakness    Lower Extremity Assessment Lower Extremity Assessment: Generalized weakness       Communication   Communication: No difficulties  Cognition Arousal/Alertness: Awake/alert Behavior During Therapy: WFL for tasks assessed/performed Overall Cognitive Status: Impaired/Different from baseline Area of Impairment: Safety/judgement;Attention;Memory                   Current Attention Level: Sustained Memory: Decreased short-term memory   Safety/Judgement: Decreased awareness of safety;Decreased awareness of deficits     General Comments: kept forgetting how to use the yonker and got agitated with herself for forgetting each time      General Comments      Exercises     Assessment/Plan    PT Assessment Patient needs continued PT  services  PT Problem List Decreased activity tolerance;Decreased mobility;Decreased cognition;Decreased safety awareness;Decreased knowledge of use of DME       PT Treatment Interventions DME instruction;Therapeutic activities;Balance training;Cognitive remediation;Patient/family education;Therapeutic exercise;Functional mobility training;Gait training    PT Goals (Current goals can be found in the Care Plan section)  Acute Rehab PT Goals Patient Stated Goal: home PT Goal Formulation: With patient Time For Goal Achievement: 09/30/19 Potential to Achieve Goals: Good    Frequency Min 3X/week   Barriers to discharge        Co-evaluation               AM-PAC PT "6 Clicks" Mobility  Outcome Measure Help needed turning from your back to your side  while in a flat bed without using bedrails?: Total Help needed moving from lying on your back to sitting on the side of a flat bed without using bedrails?: Total Help needed moving to and from a bed to a chair (including a wheelchair)?: Total Help needed standing up from a chair using your arms (e.g., wheelchair or bedside chair)?: Total Help needed to walk in hospital room?: Total Help needed climbing 3-5 steps with a railing? : Total 6 Click Score: 6    End of Session   Activity Tolerance: Treatment limited secondary to medical complications (Comment) Patient left: in bed Nurse Communication: Other (comment)(needs nausea medication) PT Visit Diagnosis: Muscle weakness (generalized) (M62.81);Other symptoms and signs involving the nervous system (R29.898);Other abnormalities of gait and mobility (R26.89)    Time: 1400-1415 PT Time Calculation (min) (ACUTE ONLY): 15 min   Charges:   PT Evaluation $PT Eval High Complexity: 1 High          Magda Kiel, [place] [facility] [phone] 09/16/2019   Reginia Naas 09/16/2019, 3:50 PM

## 2019-09-17 DIAGNOSIS — N3000 Acute cystitis without hematuria: Secondary | ICD-10-CM

## 2019-09-17 DIAGNOSIS — G9341 Metabolic encephalopathy: Secondary | ICD-10-CM

## 2019-09-17 LAB — CSF CELL COUNT WITH DIFFERENTIAL
Eosinophils, CSF: 0 % (ref 0–1)
Lymphs, CSF: 19 % — ABNORMAL LOW (ref 40–80)
Monocyte-Macrophage-Spinal Fluid: 8 % — ABNORMAL LOW (ref 15–45)
RBC Count, CSF: 3250 /mm3 — ABNORMAL HIGH
Segmented Neutrophils-CSF: 73 % — ABNORMAL HIGH (ref 0–6)
Tube #: 1
WBC, CSF: 8 /mm3 — ABNORMAL HIGH (ref 0–5)

## 2019-09-17 LAB — CBC WITH DIFFERENTIAL/PLATELET
Abs Immature Granulocytes: 0.09 10*3/uL — ABNORMAL HIGH (ref 0.00–0.07)
Basophils Absolute: 0 10*3/uL (ref 0.0–0.1)
Basophils Relative: 0 %
Eosinophils Absolute: 0 10*3/uL (ref 0.0–0.5)
Eosinophils Relative: 0 %
HCT: 33.4 % — ABNORMAL LOW (ref 36.0–46.0)
Hemoglobin: 10.7 g/dL — ABNORMAL LOW (ref 12.0–15.0)
Immature Granulocytes: 1 %
Lymphocytes Relative: 14 %
Lymphs Abs: 2.1 10*3/uL (ref 0.7–4.0)
MCH: 26.9 pg (ref 26.0–34.0)
MCHC: 32 g/dL (ref 30.0–36.0)
MCV: 83.9 fL (ref 80.0–100.0)
Monocytes Absolute: 0.9 10*3/uL (ref 0.1–1.0)
Monocytes Relative: 6 %
Neutro Abs: 11.7 10*3/uL — ABNORMAL HIGH (ref 1.7–7.7)
Neutrophils Relative %: 79 %
Platelets: 242 10*3/uL (ref 150–400)
RBC: 3.98 MIL/uL (ref 3.87–5.11)
RDW: 15.9 % — ABNORMAL HIGH (ref 11.5–15.5)
WBC: 14.7 10*3/uL — ABNORMAL HIGH (ref 4.0–10.5)
nRBC: 0 % (ref 0.0–0.2)

## 2019-09-17 LAB — COMPREHENSIVE METABOLIC PANEL
ALT: 24 U/L (ref 0–44)
AST: 39 U/L (ref 15–41)
Albumin: 3 g/dL — ABNORMAL LOW (ref 3.5–5.0)
Alkaline Phosphatase: 59 U/L (ref 38–126)
Anion gap: 10 (ref 5–15)
BUN: 17 mg/dL (ref 8–23)
CO2: 21 mmol/L — ABNORMAL LOW (ref 22–32)
Calcium: 8.4 mg/dL — ABNORMAL LOW (ref 8.9–10.3)
Chloride: 108 mmol/L (ref 98–111)
Creatinine, Ser: 1.41 mg/dL — ABNORMAL HIGH (ref 0.44–1.00)
GFR calc Af Amer: 42 mL/min — ABNORMAL LOW (ref >60)
GFR calc non Af Amer: 37 mL/min — ABNORMAL LOW (ref >60)
Glucose, Bld: 102 mg/dL — ABNORMAL HIGH (ref 70–99)
Potassium: 3.6 mmol/L (ref 3.5–5.1)
Sodium: 139 mmol/L (ref 135–145)
Total Bilirubin: 0.7 mg/dL (ref 0.3–1.2)
Total Protein: 5.8 g/dL — ABNORMAL LOW (ref 6.5–8.1)

## 2019-09-17 LAB — GLUCOSE, CAPILLARY
Glucose-Capillary: 102 mg/dL — ABNORMAL HIGH (ref 70–99)
Glucose-Capillary: 85 mg/dL (ref 70–99)
Glucose-Capillary: 92 mg/dL (ref 70–99)
Glucose-Capillary: 95 mg/dL (ref 70–99)

## 2019-09-17 LAB — TROPONIN I (HIGH SENSITIVITY)
Troponin I (High Sensitivity): 24 ng/L — ABNORMAL HIGH (ref <18)
Troponin I (High Sensitivity): 25 ng/L — ABNORMAL HIGH (ref <18)

## 2019-09-17 LAB — PROCALCITONIN: Procalcitonin: 0.84 ng/mL

## 2019-09-17 MED ORDER — IPRATROPIUM-ALBUTEROL 0.5-2.5 (3) MG/3ML IN SOLN: 3.0000 mL | Freq: Four times a day (QID) | RESPIRATORY_TRACT | Status: DC | PRN

## 2019-09-17 MED ORDER — ROSUVASTATIN CALCIUM 20 MG PO TABS
20.0000 mg | ORAL_TABLET | Freq: Every day | ORAL | Status: DC
  Administered 2019-09-17 – 2019-09-20 (×4): 20 mg via ORAL
  Filled 2019-09-17 (×4): qty 1

## 2019-09-17 MED ORDER — SERTRALINE HCL 50 MG PO TABS
50.0000 mg | ORAL_TABLET | Freq: Every day | ORAL | Status: DC
  Administered 2019-09-17 – 2019-09-20 (×4): 50 mg via ORAL
  Filled 2019-09-17 (×4): qty 1

## 2019-09-17 MED ORDER — IPRATROPIUM-ALBUTEROL 0.5-2.5 (3) MG/3ML IN SOLN
3.0000 mL | Freq: Two times a day (BID) | RESPIRATORY_TRACT | Status: DC
  Administered 2019-09-17: 3 mL via RESPIRATORY_TRACT
  Filled 2019-09-17: qty 3

## 2019-09-17 MED ORDER — HYDRALAZINE HCL 20 MG/ML IJ SOLN
10.0000 mg | INTRAMUSCULAR | Status: DC | PRN
  Administered 2019-09-17 – 2019-09-21 (×7): 10 mg via INTRAVENOUS
  Filled 2019-09-17 (×7): qty 1

## 2019-09-17 MED ORDER — ASPIRIN EC 81 MG PO TBEC
81.0000 mg | DELAYED_RELEASE_TABLET | Freq: Every day | ORAL | Status: DC
  Administered 2019-09-17 – 2019-09-20 (×4): 81 mg via ORAL
  Filled 2019-09-17 (×4): qty 1

## 2019-09-17 MED ORDER — AMLODIPINE BESYLATE 10 MG PO TABS
10.0000 mg | ORAL_TABLET | Freq: Every day | ORAL | Status: DC
  Administered 2019-09-18 – 2019-09-21 (×4): 10 mg via ORAL
  Filled 2019-09-17 (×4): qty 1

## 2019-09-17 NOTE — Evaluation (Signed)
Occupational Therapy Evaluation Patient Details Name: Sherry Kent MRN: [id number] DOB: December 18, 1945 Today's Date: 09/17/2019    History of Present Illness Sherry Kent is a 74 year old female with a past medical history notable for CKD, HTN, HLD, impaired glucose intolerance, who was brought to the ED via EMS for AMS. Patient agitated and placed on vent for brain imaging, thought to be due to UTI with treatment and some confusion earlier in the week.   Clinical Impression   This 74 yo female admitted with above presents to acute OT doing much better today (than on PT eval yesterday), BP still high, pt is min guard A to S level when up on her feet for basic ADLs and normally is independent and lives alone. She will benefit from one more session of acute OT without need for follow up.    Follow Up Recommendations  No OT follow up    Equipment Recommendations  None recommended by OT       Precautions / Restrictions Precautions Precautions: Fall Precaution Comments: watch BP, watch for N&V (BP still up 1/15 but no N/V) Restrictions Weight Bearing Restrictions: No      Mobility Bed Mobility Overal bed mobility: Independent                Transfers Overall transfer level: Needs assistance Equipment used: None Transfers: Sit to/from Stand Sit to Stand: Supervision         General transfer comment: min guard A ambulation without AD    Balance Overall balance assessment: Mild deficits observed, not formally tested(in standing not sitting)                                         ADL either performed or assessed with clinical judgement   ADL Overall ADL's : Needs assistance/impaired Eating/Feeding: Independent   Grooming: Wash/dry hands;Brushing hair;Oral care;Supervision/safety;Standing;Set up   Upper Body Bathing: Set up;Supervision/ safety;Sitting   Lower Body Bathing: Supervison/ safety;Set up;Sit to/from stand   Upper Body Dressing :  Supervision/safety;Set up;Sitting   Lower Body Dressing: Supervision/safety;Set up;Sit to/from stand   Toilet Transfer: Supervision/safety;Ambulation   Toileting- Clothing Manipulation and Hygiene: Supervision/safety;Sit to/from stand               Vision Patient Visual Report: No change from baseline              Pertinent Vitals/Pain Pain Assessment: No/denies pain     Hand Dominance Right   Extremity/Trunk Assessment Upper Extremity Assessment Upper Extremity Assessment: Overall WFL for tasks assessed           Communication Communication Communication: No difficulties   Cognition Arousal/Alertness: Awake/alert Behavior During Therapy: WFL for tasks assessed/performed Overall Cognitive Status: Within Functional Limits for tasks assessed                                                Home Living Family/patient expects to be discharged to:: Private residence Living Arrangements: Alone Available Help at Discharge: Family;Available 24 hours/day(niece can stay with her) Type of Home: Apartment Home Access: Level entry     Home Layout: One level     Bathroom Shower/Tub: Teacher, early years/pre: Standard     Home Equipment: None   Additional Comments: works  as professor at [employer]      Prior Functioning/Environment Level of Independence: Independent                 OT Problem List: Impaired balance (sitting and/or standing)      OT Treatment/Interventions: Self-care/ADL training;DME and/or AE instruction;Patient/family education;Balance training    OT Goals(Current goals can be found in the care plan section) Acute Rehab OT Goals Patient Stated Goal: to go home OT Goal Formulation: With patient Time For Goal Achievement: 10/01/19 Potential to Achieve Goals: Good  OT Frequency: Min 2X/week           Co-evaluation PT/OT/SLP Co-Evaluation/Treatment: Yes Reason for Co-Treatment: Other (comment)(pt was not  able to tolerate much yesterday) PT goals addressed during session: Mobility/safety with mobility;Balance;Strengthening/ROM OT goals addressed during session: Strengthening/ROM;ADL's and self-care      AM-PAC OT "6 Clicks" Daily Activity     Outcome Measure Help from another person eating meals?: None Help from another person taking care of personal grooming?: A Little Help from another person toileting, which includes using toliet, bedpan, or urinal?: A Little Help from another person bathing (including washing, rinsing, drying)?: A Little Help from another person to put on and taking off regular upper body clothing?: A Little Help from another person to put on and taking off regular lower body clothing?: A Little 6 Click Score: 19   End of Session Equipment Utilized During Treatment: Gait belt  Activity Tolerance: Patient tolerated treatment well Patient left: in chair;with call bell/phone within reach;with chair alarm set  OT Visit Diagnosis: Unsteadiness on feet (R26.81);Muscle weakness (generalized) (M62.81)                Time: [id number] OT Time Calculation (min): 21 min Charges:  OT General Charges $OT Visit: 1 Visit OT Evaluation $OT Eval Moderate Complexity: 1 Mod  Cathy OTR/L Acute [employer] Pager [phone] Office [phone]     09/17/2019, 12:17 PM

## 2019-09-17 NOTE — Progress Notes (Signed)
PROGRESS NOTE    Sherry Kent  [id number] DOB: [birth year] DOA: 09/15/2019 PCP: Alycia Rossetti, MD   Brief Narrative:  Sherry Kent is a 74 year old female with a past medical history notable for CKD, HTN, HLD, impaired glucose intolerance, who was brought to the ED via EMS for AMS.She is a professor at [employer] and when she did not show for work this morning a welfare check was completed.  Patient lives alone where she was found down minimally responsive in the shower.  Last known normal was approximate 4 PM the prior day.  Patient was very encephalopathic and agitated with EMS, intubated for airway protection.  History of recent UTI being treated as an outpatient.  Patient does not remember anything.  Mentation improved and she was extubated yesterday, could not find any note when??  And transferred out to [facility] today.  On arrival she was febrile at [id number], neutrophilic predominant leukocytosis, no obvious source found, UA with some leukocytes, PCT at 1.12, UDS with THC only, all cultures negative including CSF. EEG negative for seizures only show some encephalopathy.  CT head, cervical spine and chest x-ray all negative.  Neurology was consulted initially-could not find any neurologic disorder so the signed off.  No troponin checked in chart.  Echocardiogram with grade 1 diastolic dysfunction, no other abnormality.  Subjective: Patient was feeling better when seen this morning.  She could not remember any event prior to this incidence.  According to her she went take a shower and next thing she remembered was that she was waking up in the hospital.  Denies any chest pain, palpitations, shortness of breath, headache, dizziness.  Currently patient she normally does not get sick very often and never had this type of incidence before.  Assessment & Plan:   Active Problems:   Airway compromise   Acute renal injury ([place])   Acute encephalopathy  Encephalopathy/syncopal episode.  Unknown etiology.   Work-up negative so far.  May be vasovagal.  No cardiac work-up in chart except echocardiogram which shows grade 1 diastolic dysfunction. Mental status improved.  Patient was alert and oriented x3.  Unable to recall anything.  Patient was initially treated with empiric antibiotics for meningitis including antiviral which were discontinued.  Currently on ceftriaxone for presumed UTI. -Check troponin. -Cardiology consult for Holter monitor to rule out any arrhythmia. -PT OT consult as patient lives alone.  Hypertension.  Pressure elevated.  Patient was on amlodipine 5 mg and lisinopril 40 mg daily at home.  Lisinopril was placed on hold due to AKI. -Increase amlodipine to 10 mg daily. -Labetalol and hydralazine as needed. -Will restart lisinopril once renal function improves. -Restart Bystolic, aspirin and Crestor.  AKI.  Creatinine peaked at 1.74 from 1.16.  Improving today, it was 1.41. Renal ultrasound was unremarkable. -Continue with gentle hydration for today. -Continue to monitor. -Avoid nephrotoxic -Lisinopril should be restarted tomorrow if renal function continued to improve.  UTI.  With positive bacteria and leukocytes.  Urine culture negative. -Continue ceftriaxone for 1 more day.  Prediabetes.  A1c 6.0 -Continue to monitor.  Conjunctivitis: L>R eye appears to be bacterial based on exudate pattern Cover with ciprofloxacin as pt wears contacts, she does not currently have contacts on.    Objective: Vitals:   09/17/19 0943 09/17/19 1102 09/17/19 1152 09/17/19 1256  BP: (!) 200/97 (!) 176/78 (!) 190/92 (!) 205/77  Pulse: 98 77 73   Resp: (!) 21 15 13    Temp:   98.5 F (36.9 C)  TempSrc:   Oral   SpO2: 93% 93% 98%   Weight:      Height:        Intake/Output Summary (Last 24 hours) at 09/17/2019 1309 Last data filed at 09/17/2019 0532 Gross per 24 hour  Intake 1367.88 ml  Output 525 ml  Net 842.88 ml   Filed Weights   09/15/19 2000 09/16/19 0400 09/17/19 0500    Weight: 79 kg 79 kg 79.3 kg    Examination:  General exam: Appears calm and comfortable.  Respiratory system: Clear to auscultation. Respiratory effort normal. Cardiovascular system: S1 & S2 heard, RRR. No JVD, murmurs, rubs, gallops or clicks. Gastrointestinal system: Soft, nontender, nondistended, bowel sounds positive. Central nervous system: Alert and oriented. No focal neurological deficits.Symmetric 5 x 5 power. Extremities: No edema, no cyanosis, pulses intact and symmetrical. Skin: No rashes, lesions or ulcers Psychiatry: Judgement and insight appear normal. Mood & affect appropriate.   DVT prophylaxis: Lovenox Code Status: Full Family Communication: No family at bedside Disposition Plan: Pending cardiology consult, most likely home tomorrow.  Consultants:   Neurology  Cardiology  PCCM  Procedures:  Antimicrobials:  Ceftriaxone.  Data Reviewed: I have personally reviewed following labs and imaging studies  CBC: Recent Labs  Lab 09/15/19 1322 09/15/19 1620 09/15/19 2200 09/16/19 0311 09/16/19 0339 09/16/19 1830 09/17/19 0219  WBC 32.1*  --   --  19.4*  --  20.0* 14.7*  NEUTROABS 28.9*  --   --   --   --   --  11.7*  HGB 14.1   < > 10.5* 13.5 11.6* 11.6* 10.7*  HCT 45.1   < > 31.0* 43.1 34.0* 37.0 33.4*  MCV 85.7  --   --  85.2  --  84.7 83.9  PLT 470*  --   --  296  --  241 242   < > = values in this interval not displayed.   Basic Metabolic Panel: Recent Labs  Lab 09/15/19 1322 09/15/19 1620 09/15/19 2230 09/16/19 0311 09/16/19 0339 09/16/19 1830 09/17/19 0219  NA 139   < > 141 141 141 140 139  K 3.7   < > 3.7 3.9 3.4* 3.8 3.6  CL 102  --  112* 111  --  110 108  CO2 17*  --  19* 17*  --  19* 21*  GLUCOSE 211*  --  105* 116*  --  110* 102*  BUN 20  --  21 23  --  19 17  CREATININE 1.74*  --  1.68* 1.66*  --  1.51* 1.41*  CALCIUM 9.5  --  7.5* 8.0*  --  8.1* 8.4*  MG  --   --   --  1.7  --  2.4  --   PHOS  --   --   --  3.8  --  3.4  --     < > = values in this interval not displayed.   GFR: Estimated Creatinine Clearance: 37.2 mL/min (A) (by C-G formula based on SCr of 1.41 mg/dL (H)). Liver Function Tests: Recent Labs  Lab 09/15/19 1322 09/16/19 1830 09/17/19 0219  AST 35 41 39  ALT 20 24 24   ALKPHOS 88 64 59  BILITOT 0.9 0.7 0.7  PROT 7.7 6.0* 5.8*  ALBUMIN 4.0 3.1* 3.0*   No results for input(s): LIPASE, AMYLASE in the last 168 hours. No results for input(s): AMMONIA in the last 168 hours. Coagulation Profile: Recent Labs  Lab 09/15/19 1632  INR 1.1  Cardiac Enzymes: Recent Labs  Lab 09/15/19 1732 09/16/19 1141 09/16/19 1830  CKTOTAL 920* 2,241* 1,915*   BNP (last 3 results) No results for input(s): PROBNP in the last 8760 hours. HbA1C: Recent Labs    09/15/19 1732  HGBA1C 6.0*   CBG: Recent Labs  Lab 09/16/19 1455 09/16/19 1910 09/16/19 2121 09/16/19 2340 09/17/19 0357  GLUCAP 116* 105* 114* 97 95   Lipid Profile: Recent Labs    09/15/19 1632  TRIG 283*   Thyroid Function Tests: No results for input(s): TSH, T4TOTAL, FREET4, T3FREE, THYROIDAB in the last 72 hours. Anemia Panel: No results for input(s): VITAMINB12, FOLATE, FERRITIN, TIBC, IRON, RETICCTPCT in the last 72 hours. Sepsis Labs: Recent Labs  Lab 09/15/19 1322 09/15/19 1632 09/15/19 2230 09/16/19 1141 09/17/19 0219  PROCALCITON  --   --   --  1.25 0.84  LATICACIDVEN 6.5* 2.6* 1.9  --   --     Recent Results (from the past 240 hour(s))  Respiratory Panel by RT PCR (Flu A&B, Covid) - Nasopharyngeal Swab     Status: None   Collection Time: 09/15/19  1:22 PM   Specimen: Nasopharyngeal Swab  Result Value Ref Range Status   SARS Coronavirus 2 by RT PCR NEGATIVE NEGATIVE Final    Comment: (NOTE) SARS-CoV-2 target nucleic acids are NOT DETECTED. The SARS-CoV-2 RNA is generally detectable in upper respiratoy specimens during the acute phase of infection. The lowest concentration of SARS-CoV-2 viral copies this  assay can detect is 131 copies/mL. A negative result does not preclude SARS-Cov-2 infection and should not be used as the sole basis for treatment or other patient management decisions. A negative result may occur with  improper specimen collection/handling, submission of specimen other than nasopharyngeal swab, presence of viral mutation(s) within the areas targeted by this assay, and inadequate number of viral copies (<131 copies/mL). A negative result must be combined with clinical observations, patient history, and epidemiological information. The expected result is Negative. Fact Sheet for Patients:  PinkCheek.be Fact Sheet for Healthcare Providers:  GravelBags.it This test is not yet ap proved or cleared by the [place] FDA and  has been authorized for detection and/or diagnosis of SARS-CoV-2 by FDA under an Emergency Use Authorization (EUA). This EUA will remain  in effect (meaning this test can be used) for the duration of the COVID-19 declaration under Section 564(b)(1) of the Act, 21 U.S.C. section 360bbb-3(b)(1), unless the authorization is terminated or revoked sooner.    Influenza A by PCR NEGATIVE NEGATIVE Final   Influenza B by PCR NEGATIVE NEGATIVE Final    Comment: (NOTE) The Xpert Xpress SARS-CoV-2/FLU/RSV assay is intended as an aid in  the diagnosis of influenza from Nasopharyngeal swab specimens and  should not be used as a sole basis for treatment. Nasal washings and  aspirates are unacceptable for Xpert Xpress SARS-CoV-2/FLU/RSV  testing. Fact Sheet for Patients: PinkCheek.be Fact Sheet for Healthcare Providers: GravelBags.it This test is not yet approved or cleared by the [place] FDA and  has been authorized for detection and/or diagnosis of SARS-CoV-2 by  FDA under an Emergency Use Authorization (EUA). This EUA will remain  in  effect (meaning this test can be used) for the duration of the  Covid-19 declaration under Section 564(b)(1) of the Act, 21  U.S.C. section 360bbb-3(b)(1), unless the authorization is  terminated or revoked. Performed at [facility], [place] [address] [place], [place] [address]   Urine culture     Status: None  Collection Time: 09/15/19  2:50 PM   Specimen: Urine, Random  Result Value Ref Range Status   Specimen Description URINE, RANDOM  Final   Special Requests NONE  Final   Culture   Final    NO GROWTH Performed at [facility], [address] [place], [place] [address]    Report Status 09/16/2019 FINAL  Final  Blood culture (routine x 2)     Status: None (Preliminary result)   Collection Time: 09/15/19  4:05 PM   Specimen: BLOOD LEFT WRIST  Result Value Ref Range Status   Specimen Description BLOOD LEFT WRIST  Final   Special Requests   Final    BOTTLES DRAWN AEROBIC AND ANAEROBIC Blood Culture results may not be optimal due to an inadequate volume of blood received in culture bottles   Culture   Final    NO GROWTH < 24 HOURS Performed at [facility], [place] [address], [place], [place] [address]    Report Status PENDING  Incomplete  Blood culture (routine x 2)     Status: None (Preliminary result)   Collection Time: 09/15/19  4:40 PM   Specimen: BLOOD  Result Value Ref Range Status   Specimen Description BLOOD RIGHT ANTECUBITAL  Final   Special Requests   Final    BOTTLES DRAWN AEROBIC AND ANAEROBIC Blood Culture adequate volume   Culture   Final    NO GROWTH < 24 HOURS Performed at [facility], [place] [address], [place], [place] [address]    Report Status PENDING  Incomplete  CSF culture     Status: None (Preliminary result)   Collection Time: 09/15/19  6:20 PM   Specimen: CSF; Cerebrospinal Fluid  Result Value Ref Range Status   Specimen Description CSF  Final   Special Requests NONE  Final   Gram Stain   Final    WBC PRESENT,BOTH PMN AND  MONONUCLEAR NO ORGANISMS SEEN CYTOSPIN SMEAR    Culture   Final    NO GROWTH 2 DAYS Performed at [facility], [place] [address] [place], [place] [address]    Report Status PENDING  Incomplete  MRSA PCR Screening     Status: None   Collection Time: 09/15/19  9:02 PM   Specimen: Nasal Mucosa; Nasopharyngeal  Result Value Ref Range Status   MRSA by PCR NEGATIVE NEGATIVE Final    Comment:        The GeneXpert MRSA Assay (FDA approved for NASAL specimens only), is one component of a comprehensive MRSA colonization surveillance program. It is not intended to diagnose MRSA infection nor to guide or monitor treatment for MRSA infections. Performed at [facility], [place] [address], [place], [place] [address]      Radiology Studies: DG Wrist 2 Views Left  Result Date: 09/15/2019 CLINICAL DATA:  Found down in shower, confused, disoriented EXAM: LEFT WRIST - 2 VIEW COMPARISON:  None. FINDINGS: No fracture or dislocation of the left wrist. The carpus is normally aligned. Mild arthrosis. Soft tissues are unremarkable. IMPRESSION: No fracture or dislocation of the left wrist. The carpus is normally aligned. Electronically Signed   By: Eddie Candle M.D.   On: 09/15/2019 14:32   DG Abd 1 View  Result Date: 09/16/2019 CLINICAL DATA:  74 year old female enteric tube placement. EXAM: ABDOMEN - 1 VIEW COMPARISON:  None. FINDINGS: Portable AP semi upright view at 0318 hours. Enteric tube courses into the stomach with side hole at the level  of the gastric body. Mild streaky opacity at the left lung base. Visible right lung base appears negative. Visualized bowel gas pattern is non obstructed. Scoliosis. No acute osseous abnormality identified. IMPRESSION: Enteric tube placed into the stomach with side hole at the level of the gastric body. Electronically Signed   By: Genevie Ann M.D.   On: 09/16/2019 03:50   US RENAL  Result Date: 09/15/2019 CLINICAL DATA:  AKI EXAM: RENAL / URINARY TRACT  ULTRASOUND COMPLETE COMPARISON:  None. FINDINGS: Right Kidney: Renal measurements: 10 x 4.4 x 4.1 cm = volume: 92.6 mL . Echogenicity within normal limits. No mass or hydronephrosis visualized. Left Kidney: Renal measurements: 10.3 x 5.4 x 3.5 cm = volume: 101.9 mL. Echogenicity within normal limits. No mass or hydronephrosis visualized. Bladder: Decompressed by Foley catheter and therefore poorly assessed by sonography. Other: Patient is intubated at the time of exam with inability to reposition patient. IMPRESSION: Unremarkable appearance of the kidneys. Bladder is decompressed by Foley catheter. Technically difficult exam secondary to inability to reposition this intubated patient. Electronically Signed   By: Lovena Le M.D.   On: 09/15/2019 19:36   DG Pelvis Portable  Result Date: 09/15/2019 CLINICAL DATA:  Pain. The patient was found down. EXAM: PORTABLE PELVIS 1-2 VIEWS COMPARISON:  None. FINDINGS: There is no evidence of pelvic fracture or diastasis. No pelvic bone lesions are seen. Degenerative changes in the lower lumbar spine. IMPRESSION: Normal appearing pelvis. Degenerative changes in the lower lumbar spine. Electronically Signed   By: Lorriane Shire M.D.   On: 09/15/2019 14:34   DG Chest Port 1 View  Result Date: 09/16/2019 CLINICAL DATA:  74 year old female intubated after found down. EXAM: PORTABLE CHEST 1 VIEW COMPARISON:  09/15/2019 portable chest. FINDINGS: Portable AP upright view at 0322 hours. Endotracheal tube tip in good position between the level the clavicles and carina. Enteric tube courses to the abdomen. Mildly lower lung volumes. Streaky left lung base opacity is new and most resembles atelectasis. Elsewhere lungs remain clear allowing for portable technique. Normal cardiac size and mediastinal contours. No acute osseous abnormality identified. IMPRESSION: 1.  Stable lines and tubes. 2. Left lung base atelectasis. No other acute cardiopulmonary abnormality. Electronically  Signed   By: Genevie Ann M.D.   On: 09/16/2019 03:52   DG Chest Portable 1 View  Result Date: 09/15/2019 CLINICAL DATA:  Altered level of consciousness. Patient was found down. EXAM: PORTABLE CHEST 1 VIEW COMPARISON:  04/08/2006 FINDINGS: Endotracheal tube in good position 3 cm above the carina. NG tube tip below the diaphragm. Heart size and pulmonary vascularity are normal. Lungs are clear. No bone abnormality. IMPRESSION: Endotracheal tube in good position. Lungs are clear. Electronically Signed   By: Lorriane Shire M.D.   On: 09/15/2019 14:33   EEG adult  Result Date: 09/15/2019 Lora Havens, MD     09/15/2019  8:21 PM Patient Name: MELYNDA BEYEA MRN: [id number] Epilepsy Attending: Lora Havens Referring Physician/Provider: Dr Amie [place] Date: 09/15/2019 Duration: 21.23 mins Patient history: 74yo F with ams. EEG to evaluate for seizure Level of alertness: comatose AEDs during EEG study: None Technical aspects: This EEG study was done with scalp electrodes positioned according to the 10-20 International system of electrode placement. Electrical activity was acquired at a sampling rate of 500Hz  and reviewed with a high frequency filter of 70Hz  and a low frequency filter of 1Hz . EEG data were recorded continuously and digitally stored. DESCRIPTION: EEG showed continuous generalized background attenuation. EEG was  not reactive to tactile stimulation. Hyperventilation and photic stimulation were not performed. ABNORMALITY - Background attenuation, generalized IMPRESSION: This study is suggestive of profound diffuse encephalopathy, non specific to etiology.  No seizures or epileptiform discharges were seen throughout the recording. Lora Havens   ECHOCARDIOGRAM COMPLETE  Result Date: 09/16/2019   ECHOCARDIOGRAM REPORT   Patient Name:   TEFFANY OBANDO Date of Exam: 09/16/2019 Medical Rec #:  [id number]      Height:       66.0 in Accession #:    [id number]     Weight:       174.2 lb Date of Birth:   04/11/1946       BSA:          1.89 m Patient Age:    74 years       BP:           127/63 mmHg Patient Gender: F              HR:           83 bpm. Exam Location:  Inpatient Procedure: 2D Echo Indications:    Syncope 780.2 / R55  History:        Patient has prior history of Echocardiogram examinations, most                 recent 11/03/2014. TIA; Risk Factors:Hypertension and                 Dyslipidemia.  Sonographer:    Vikki Ports Turrentine Referring Phys: [place]  1. Suboptimal image quality to rule out an intracardiac source of emboli. No large or definite valvular lesions noted. Consider TEE if clinically indicated for source of stroke.  2. The interatrial septum was not well visualized.  3. Left ventricular ejection fraction, by visual estimation, is 70 to 75%. The left ventricle has hyperdynamic function. There is no left ventricular hypertrophy.  4. Left ventricular diastolic parameters are consistent with Grade I diastolic dysfunction (impaired relaxation).  5. Global right ventricle has normal systolic function.The right ventricular size is normal. No increase in right ventricular wall thickness.  6. Left atrial size was normal.  7. Right atrial size was normal.  8. The mitral valve is grossly normal. No evidence of mitral valve regurgitation. No evidence of mitral stenosis.  9. The tricuspid valve is not well visualized. 10. The aortic valve is grossly normal. Aortic valve regurgitation is not visualized. Mild aortic valve sclerosis without stenosis. 11. The pulmonic valve was not well visualized. Pulmonic valve regurgitation is trivial. 12. The inferior vena cava is normal in size with greater than 50% respiratory variability, suggesting right atrial pressure of 3 mmHg. 13. The ascending aorta was not well visualized and the aortic arch was not well visualized. FINDINGS  Left Ventricle: Left ventricular ejection fraction, by visual estimation, is 70 to 75%. The left ventricle has  hyperdynamic function. The left ventricle is not well visualized. There is no left ventricular hypertrophy. Left ventricular diastolic parameters are consistent with Grade I diastolic dysfunction (impaired relaxation). Right Ventricle: The right ventricular size is normal. No increase in right ventricular wall thickness. Global RV systolic function is has normal systolic function. Left Atrium: Left atrial size was normal in size. Right Atrium: Right atrial size was normal in size Pericardium: There is no evidence of pericardial effusion. Mitral Valve: The mitral valve is grossly normal. No evidence of mitral valve regurgitation. No evidence of mitral valve stenosis  by observation. Tricuspid Valve: The tricuspid valve is not well visualized. Tricuspid valve regurgitation is trivial. Aortic Valve: The aortic valve is grossly normal. Aortic valve regurgitation is not visualized. Mild aortic valve sclerosis is present, with no evidence of aortic valve stenosis. Pulmonic Valve: The pulmonic valve was not well visualized. Pulmonic valve regurgitation is trivial. Pulmonic regurgitation is trivial. Aorta: The aortic root is normal in size and structure, the ascending aorta was not well visualized and the aortic arch was not well visualized. Venous: The inferior vena cava is normal in size with greater than 50% respiratory variability, suggesting right atrial pressure of 3 mmHg. IAS/Shunts: The interatrial septum was not well visualized.  LEFT VENTRICLE PLAX 2D LVIDd:         3.70 cm  Diastology LVIDs:         2.30 cm  LV e' lateral:   6.42 cm/s LV PW:         1.00 cm  LV E/e' lateral: 12.4 LV IVS:        1.00 cm  LV e' medial:    7.07 cm/s LVOT diam:     1.80 cm  LV E/e' medial:  11.2 LV SV:         40 ml LV SV Index:   20.65 LVOT Area:     2.54 cm  RIGHT VENTRICLE RV S prime:     16.90 cm/s TAPSE (M-mode): 2.4 cm LEFT ATRIUM             Index LA diam:        4.50 cm 2.39 cm/m LA Vol (A2C):   57.9 ml 30.71 ml/m LA Vol  (A4C):   63.2 ml 33.52 ml/m LA Biplane Vol: 60.6 ml 32.14 ml/m  AORTIC VALVE LVOT Vmax:   154.00 cm/s LVOT Vmean:  104.000 cm/s LVOT VTI:    0.315 m  AORTA Ao Root diam: 2.80 cm MV E velocity: 79.30 cm/s 103 cm/s                                    SHUNTS                                    Systemic VTI:  0.32 m                                    Systemic Diam: 1.80 cm  Cherlynn Kaiser MD Electronically signed by Cherlynn Kaiser MD Signature Date/Time: 09/16/2019/2:00:04 PM    Final     Scheduled Meds: . Derrill Memo ON 09/18/2019] amLODipine  10 mg Oral Daily  . bisoprolol  5 mg Oral Daily  . Chlorhexidine Gluconate Cloth  6 each Topical Daily  . ciprofloxacin  2 drop Both Eyes Q4H while awake  . enoxaparin (LOVENOX) injection  40 mg Subcutaneous Q24H  . insulin aspart  0-24 Units Subcutaneous Q4H  . ipratropium-albuterol  3 mL Nebulization BID  . pantoprazole  40 mg Oral Daily   Continuous Infusions: . sodium chloride 10 mL/hr at 09/16/19 2000  . cefTRIAXone (ROCEPHIN)  IV 2 g (09/17/19 0456)  . lactated ringers 75 mL/hr at 09/17/19 0336     LOS: 2 days   Time spent: 40 minutes.  I personally reviewed her chart.  Avonte Sensabaugh  Reesa Chew, MD [facility] Pager [phone]  If 7PM-7AM, please contact night-coverage [email or web address] Password [facility] 09/17/2019, 1:09 PM   This record has been created using Systems analyst. Errors have been sought and corrected,but may not always be located. Such creation errors do not reflect on the standard of care.

## 2019-09-17 NOTE — Progress Notes (Signed)
No arhythmia seen on EKG or telemetry. Doubt cardiac etiology for her presentation. Full consult note to follow.

## 2019-09-17 NOTE — Progress Notes (Signed)
Physical Therapy Treatment Patient Details Name: Sherry Kent MRN: [id number] DOB: 1946/07/21 Today's Date: 09/17/2019    History of Present Illness Sherry Kent is a 74 year old female with a past medical history notable for CKD, HTN, HLD, impaired glucose intolerance, who was brought to the ED via EMS for AMS. Patient agitated and placed on vent for brain imaging, thought to be due to UTI with treatment and some confusion earlier in the week.    PT Comments    Patient progressing well towards PT goals. Reports no nausea/vomiting today. Continues to have elevated BP however pt asymptomatic. Tolerated transfers and gait training with Min guard assist for balance/safety due to mild unsteadiness but no overt LOB. Seems to be trending towards cognitive/functional baseline today. BP pre activity 200/98, BP post activity 214/98. RN notified. Will continue to follow.   Follow Up Recommendations  Supervision/Assistance - 24 hour;Home health PT     Equipment Recommendations  None recommended by PT    Recommendations for Other Services       Precautions / Restrictions Precautions Precautions: Fall Precaution Comments: watch BP Restrictions Weight Bearing Restrictions: No    Mobility  Bed Mobility Overal bed mobility: Needs Assistance Bed Mobility: Supine to Sit     Supine to sit: Modified independent (Device/Increase time);HOB elevated     General bed mobility comments: No assist needed.  Transfers Overall transfer level: Needs assistance Equipment used: None Transfers: Sit to/from Stand Sit to Stand: Min guard         General transfer comment: Min guard for safety. Stood from EOB without difficulty. transferred to chair post ambulation.  Ambulation/Gait Ambulation/Gait assistance: Min guard Gait Distance (Feet): 250 Feet Assistive device: None Gait Pattern/deviations: Step-through pattern;Decreased stride length;Drifts right/left   Gait velocity interpretation:  >2.62 ft/sec, indicative of community ambulatory General Gait Details: Good paced gait with drifting and mild staggering noted but no overt LOB. 2/4 DOE. elevated BP   Stairs             Wheelchair Mobility    Modified Rankin (Stroke Patients Only)       Balance Overall balance assessment: Needs assistance Sitting-balance support: Feet supported;No upper extremity supported Sitting balance-Leahy Scale: Good     Standing balance support: During functional activity Standing balance-Leahy Scale: Fair Standing balance comment: Able to stand at sink and brush teeth with Min guard-supervision                            Cognition Arousal/Alertness: Awake/alert Behavior During Therapy: WFL for tasks assessed/performed Overall Cognitive Status: Within Functional Limits for tasks assessed                                 General Comments: A&Ox4, does not now why she is here but does remember they found her in the shower.      Exercises      General Comments General comments (skin integrity, edema, etc.): Sitting BP pre activity 200/98, sitting BP post activity 214/98.      Pertinent Vitals/Pain Pain Assessment: No/denies pain    Home Living Family/patient expects to be discharged to:: Private residence Living Arrangements: Alone Available Help at Discharge: Family;Available 24 hours/day(niece can stay with her) Type of Home: Apartment Home Access: Level entry   Home Layout: One level Home Equipment: None 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[employer]  Level of Independence: Independent          PT Goals (current goals can now be found in the care plan section) Acute Rehab PT Goals Patient Stated Goal: to go home Progress towards PT goals: Progressing toward goals    Frequency    Min 3X/week      PT Plan Current plan remains appropriate    Co-evaluation PT/OT/SLP Co-Evaluation/Treatment: Yes Reason for  Co-Treatment: Other (comment)(bed eval done yesterday, poor tolerance) PT goals addressed during session: Mobility/safety with mobility;Balance;Strengthening/ROM OT goals addressed during session: Strengthening/ROM;ADL's and self-care      AM-PAC PT "6 Clicks" Mobility   Outcome Measure  Help needed turning from your back to your side while in a flat bed without using bedrails?: None Help needed moving from lying on your back to sitting on the side of a flat bed without using bedrails?: None Help needed moving to and from a bed to a chair (including a wheelchair)?: A Little Help needed standing up from a chair using your arms (e.g., wheelchair or bedside chair)?: A Little Help needed to walk in hospital room?: A Little Help needed climbing 3-5 steps with a railing? : A Little 6 Click Score: 20    End of Session Equipment Utilized During Treatment: Gait belt Activity Tolerance: Patient tolerated treatment well Patient left: in chair;with call bell/phone within reach;with chair alarm set Nurse Communication: Mobility status;Other (comment)(elevated BP) PT Visit Diagnosis: Muscle weakness (generalized) (M62.81);Other symptoms and signs involving the nervous system (R29.898);Other abnormalities of gait and mobility (R26.89)     Time: [id number] PT Time Calculation (min) (ACUTE ONLY): 21 min  Charges:  $Gait Training: 8-22 mins                     Marisa Severin, PT, DPT Acute Rehabilitation Services Pager [phone] Office [place] 09/17/2019, 12:24 PM

## 2019-09-17 NOTE — Consult Note (Signed)
CARDIOLOGY CONSULT NOTE  Patient ID: MAYSEN BLACKWELDER MRN: [id number] DOB/AGE: September 21, 1945 74 y.o.  Admit date: 09/15/2019 Referring Physician: [facility] Reason for Consultation:  Loss of consciousness  HPI:   74 y.o. Caucasian female  with hypertension, hyperlipidemia, admitted with altered mental status on 09/14/2018. 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 Her last memory before her episode of confusion and loss of consciousness is being in shower. She does not recollect having had any chest pain, shortness of breath, palpitations, leg edema, orthopnea, PND, TIA/syncope. In the hospital, she was intubated for airway protection and is now extubated. She has had fever, leukocytosis, but otherwise not definitive for ischemic etiology. CK and troponin HS both elevated. Patient is now back to baseline in terms of her consciousness and denies any complaints.   Past Medical History:  Diagnosis Date  . Anxiety   . H/O hematuria   . History of colon polyps   . Hyperlipidemia   . Hypertension      Past Surgical History:  Procedure Laterality Date  . APPENDECTOMY  8/07  . DILATION AND CURETTAGE OF UTERUS    . EYE SURGERY     bilat cataracts  . TONSILLECTOMY       Family History  Problem Relation Age of Onset  . Colon cancer Sister      Social History: Social History   Socioeconomic History  . Marital status: Married    Spouse name: Not on file  . Number of children: 0  . Years of education: Not on file  . Highest education level: Not on file  Occupational History  . 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 Smoking status: Former Smoker    Packs/day: 1.00    Years: 30.00    Pack years: 30.00    Types: Cigarettes    Quit date: 10/27/2003    Years since quitting: 15.9  . Smokeless tobacco: Never Used  Substance and Sexual Activity  . Alcohol use: Yes    Alcohol/week: 0.0 standard drinks    Comment: socially  . Drug use:  No  . Sexual activity: Not on file  Other Topics Concern  . Not on file  Social History Narrative   Widowed. No children.   Retired.    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 Difficulty of Paying Living Expenses: Not on file  Food Insecurity:   . Worried About Charity fundraiser in the Last Year: Not on file  . Ran Out of Food in the Last Year: Not on file  Transportation Needs:   . Lack of Transportation (Medical): Not on file  . Lack of Transportation (Non-Medical): Not on file  Physical Activity:   . Days of Exercise per Week: Not on file  . Minutes of Exercise per Session: Not on file  Stress:   . Feeling of Stress : Not on file  Social Connections:   . Frequency of Communication with Friends and Family: Not on file  . Frequency of Social Gatherings with Friends and Family: Not on file  . Attends Religious Services: Not on file  . Active Member of Clubs or Organizations: Not on file  . Attends Archivist Meetings: Not on file  . Marital Status: Not on file  Intimate Partner Violence:   .  Fear of Current or Ex-Partner: Not on file  . Emotionally Abused: Not on file  . Physically Abused: Not on file  . Sexually Abused: Not on file     Medications Prior to Admission  Medication Sig Dispense Refill Last Dose  . aspirin EC 81 MG tablet Take 81 mg by mouth daily.   09/15/2019 at Unknown time  . Calcium Carbonate-Vit D-Min (CALCIUM 1200 PO) Take by mouth. In divided doses   09/15/2019 at Unknown time  . Cholecalciferol (SM VITAMIN D3) 4000 UNITS CAPS Take 1 capsule (4,000 Units total) by mouth daily. 30 capsule 11 09/15/2019 at Unknown time  . Ibuprofen-diphenhydrAMINE HCl (ADVIL PM) 200-25 MG CAPS Take 1 tablet by mouth daily as needed (For pain).   09/14/2019 at Unknown time  . lisinopril (ZESTRIL) 40 MG tablet TAKE 1 TABLET(40 MG) BY  MOUTH DAILY (Patient taking differently: Take 40 mg by mouth daily. ) 90 tablet 1 09/15/2019 at Unknown time  . meloxicam (MOBIC) 7.5 MG tablet TAKE 1 TABLET(7.5 MG) BY MOUTH DAILY (Patient taking differently: Take 7.5 mg by mouth daily. ) 30 tablet 3 09/15/2019 at Unknown time  . nebivolol (BYSTOLIC) 5 MG tablet TAKE 1 TABLET(5 MG) BY MOUTH DAILY (Patient taking differently: Take 5 mg by mouth daily. ) 90 tablet 1 09/15/2019 at 0800  . omeprazole (PRILOSEC) 20 MG capsule TAKE 1 CAPSULE(20 MG) BY MOUTH DAILY (Patient taking differently: Take 20 mg by mouth daily. ) 30 capsule 3 09/15/2019 at Unknown time  . ondansetron (ZOFRAN) 4 MG tablet Take 1 tablet (4 mg total) by mouth every 8 (eight) hours as needed for nausea or vomiting. 20 tablet 0 09/15/2019 at Unknown time  . rosuvastatin (CRESTOR) 20 MG tablet TAKE 1 TABLET(20 MG) BY MOUTH DAILY (Patient taking differently: Take 20 mg by mouth daily. ) 90 tablet 1 09/15/2019 at Unknown time  . sertraline (ZOLOFT) 50 MG tablet TAKE 1 TABLET(50 MG) BY MOUTH DAILY (Patient taking differently: Take 50 mg by mouth daily. ) 90 tablet 1 09/15/2019 at Unknown time  . benzonatate (TESSALON) 100 MG capsule Take 1 capsule (100 mg total) by mouth 3 (three) times daily as needed for cough. 30 capsule 0 unknown at unknown    Review of Systems  Constitution: Negative for decreased appetite, malaise/fatigue, weight gain and weight loss.  HENT: Negative for congestion.   Eyes: Negative for visual disturbance.  Cardiovascular: Negative for chest pain, dyspnea on exertion, leg swelling, palpitations and syncope.  Respiratory: Negative for cough.   Endocrine: Negative for cold intolerance.  Hematologic/Lymphatic: Does not bruise/bleed easily.  Skin: Negative for itching and rash.  Musculoskeletal: Negative for myalgias.  Gastrointestinal: Negative for abdominal pain, nausea and vomiting.  Genitourinary: Negative for dysuria.  Neurological: Negative for dizziness and  weakness.  Psychiatric/Behavioral: The patient is not nervous/anxious.   All other systems reviewed and are negative.     Physical Exam: Physical Exam  Constitutional: She is oriented to person, place, and time. She appears well-developed and well-nourished. No distress.  HENT:  Head: Normocephalic and atraumatic.  Eyes: Pupils are equal, round, and reactive to light. Conjunctivae are normal.  Neck: No JVD present.  Cardiovascular: Normal rate, regular rhythm and intact distal pulses.  No murmur heard. Pulmonary/Chest: Effort normal and breath sounds normal. She has no wheezes. She has no rales.  Abdominal: Soft. Bowel sounds are normal. There is no rebound.  Musculoskeletal:        General: No edema.  Lymphadenopathy:    She  has no cervical adenopathy.  Neurological: She is alert and oriented to person, place, and time. No cranial nerve deficit.  Skin: Skin is warm and dry.  Psychiatric: She has a normal mood and affect.  Nursing note and vitals reviewed.    Labs:   Lab Results  Component Value Date   WBC 14.7 (H) 09/17/2019   HGB 10.7 (L) 09/17/2019   HCT 33.4 (L) 09/17/2019   MCV 83.9 09/17/2019   PLT 242 09/17/2019    Recent Labs  Lab 09/17/19 0219  NA 139  K 3.6  CL 108  CO2 21*  BUN 17  CREATININE 1.41*  CALCIUM 8.4*  PROT 5.8*  BILITOT 0.7  ALKPHOS 59  ALT 24  AST 39  GLUCOSE 102*    Lipid Panel     Component Value Date/Time   CHOL 197 03/15/2019 0829   TRIG 283 (H) 09/15/2019 1632   HDL 45 (L) 03/15/2019 0829   CHOLHDL 4.4 03/15/2019 0829   VLDL 47 (H) 06/05/2015 1022   LDLCALC 107 (H) 03/15/2019 0829    HEMOGLOBIN A1C Lab Results  Component Value Date   HGBA1C 6.0 (H) 09/15/2019   MPG 125.5 09/15/2019    Cardiac Panel (last 3 results) Recent Labs    09/15/19 1732 09/16/19 1141 09/16/19 1830  CKTOTAL 920* 2,241* 1,915*    Lab Results  Component Value Date   CKTOTAL 1,915 (H) 09/16/2019   CKMB 1.6 12/11/2011       Radiology: DG Abd 1 View  Result Date: 09/16/2019 CLINICAL DATA:  74 year old female enteric tube placement. EXAM: ABDOMEN - 1 VIEW COMPARISON:  None. FINDINGS: Portable AP semi upright view at 0318 hours. Enteric tube courses into the stomach with side hole at the level of the gastric body. Mild streaky opacity at the left lung base. Visible right lung base appears negative. Visualized bowel gas pattern is non obstructed. Scoliosis. No acute osseous abnormality identified. IMPRESSION: Enteric tube placed into the stomach with side hole at the level of the gastric body. Electronically Signed   By: Genevie Ann M.D.   On: 09/16/2019 03:50   US RENAL  Result Date: 09/15/2019 CLINICAL DATA:  AKI EXAM: RENAL / URINARY TRACT ULTRASOUND COMPLETE COMPARISON:  None. FINDINGS: Right Kidney: Renal measurements: 10 x 4.4 x 4.1 cm = volume: 92.6 mL . Echogenicity within normal limits. No mass or hydronephrosis visualized. Left Kidney: Renal measurements: 10.3 x 5.4 x 3.5 cm = volume: 101.9 mL. Echogenicity within normal limits. No mass or hydronephrosis visualized. Bladder: Decompressed by Foley catheter and therefore poorly assessed by sonography. Other: Patient is intubated at the time of exam with inability to reposition patient. IMPRESSION: Unremarkable appearance of the kidneys. Bladder is decompressed by Foley catheter. Technically difficult exam secondary to inability to reposition this intubated patient. Electronically Signed   By: Lovena Le M.D.   On: 09/15/2019 19:36   DG Chest Port 1 View  Result Date: 09/16/2019 CLINICAL DATA:  74 year old female intubated after found down. EXAM: PORTABLE CHEST 1 VIEW COMPARISON:  09/15/2019 portable chest. FINDINGS: Portable AP upright view at 0322 hours. Endotracheal tube tip in good position between the level the clavicles and carina. Enteric tube courses to the abdomen. Mildly lower lung volumes. Streaky left lung base opacity is new and most resembles atelectasis.  Elsewhere lungs remain clear allowing for portable technique. Normal cardiac size and mediastinal contours. No acute osseous abnormality identified. IMPRESSION: 1.  Stable lines and tubes. 2. Left lung base atelectasis. No other  acute cardiopulmonary abnormality. Electronically Signed   By: Genevie Ann M.D.   On: 09/16/2019 03:52   EEG adult  Result Date: 09/15/2019 Lora Havens, MD     09/15/2019  8:21 PM Patient Name: Sherry Kent MRN: [id number] Epilepsy Attending: Lora Havens Referring Physician/Provider: Dr Amie [place] Date: 09/15/2019 Duration: 21.23 mins Patient history: 74yo F with ams. EEG to evaluate for seizure Level of alertness: comatose AEDs during EEG study: None Technical aspects: This EEG study was done with scalp electrodes positioned according to the 10-20 International system of electrode placement. Electrical activity was acquired at a sampling rate of 500Hz  and reviewed with a high frequency filter of 70Hz  and a low frequency filter of 1Hz . EEG data were recorded continuously and digitally stored. DESCRIPTION: EEG showed continuous generalized background attenuation. EEG was not reactive to tactile stimulation. Hyperventilation and photic stimulation were not performed. ABNORMALITY - Background attenuation, generalized IMPRESSION: This study is suggestive of profound diffuse encephalopathy, non specific to etiology.  No seizures or epileptiform discharges were seen throughout the recording. Lora Havens   ECHOCARDIOGRAM COMPLETE  Result Date: 09/16/2019   ECHOCARDIOGRAM REPORT   Patient Name:   KEN OTOOLE Date of Exam: 09/16/2019 Medical Rec #:  [id number]      Height:       66.0 in Accession #:    [id number]     Weight:       174.2 lb Date of Birth:  28-May-1946       BSA:          1.89 m Patient Age:    5 years       BP:           127/63 mmHg Patient Gender: F              HR:           83 bpm. Exam Location:  Inpatient Procedure: 2D Echo Indications:    Syncope 780.2 / R55   History:        Patient has prior history of Echocardiogram examinations, most                 recent 11/03/2014. TIA; Risk Factors:Hypertension and                 Dyslipidemia.  Sonographer:    Vikki Ports Turrentine Referring Phys: [place]  1. Suboptimal image quality to rule out an intracardiac source of emboli. No large or definite valvular lesions noted. Consider TEE if clinically indicated for source of stroke.  2. The interatrial septum was not well visualized.  3. Left ventricular ejection fraction, by visual estimation, is 70 to 75%. The left ventricle has hyperdynamic function. There is no left ventricular hypertrophy.  4. Left ventricular diastolic parameters are consistent with Grade I diastolic dysfunction (impaired relaxation).  5. Global right ventricle has normal systolic function.The right ventricular size is normal. No increase in right ventricular wall thickness.  6. Left atrial size was normal.  7. Right atrial size was normal.  8. The mitral valve is grossly normal. No evidence of mitral valve regurgitation. No evidence of mitral stenosis.  9. The tricuspid valve is not well visualized. 10. The aortic valve is grossly normal. Aortic valve regurgitation is not visualized. Mild aortic valve sclerosis without stenosis. 11. The pulmonic valve was not well visualized. Pulmonic valve regurgitation is trivial. 12. The inferior vena cava is normal in size with greater than 50% respiratory  variability, suggesting right atrial pressure of 3 mmHg. 13. The ascending aorta was not well visualized and the aortic arch was not well visualized. FINDINGS  Left Ventricle: Left ventricular ejection fraction, by visual estimation, is 70 to 75%. The left ventricle has hyperdynamic function. The left ventricle is not well visualized. There is no left ventricular hypertrophy. Left ventricular diastolic parameters are consistent with Grade I diastolic dysfunction (impaired relaxation). Right  Ventricle: The right ventricular size is normal. No increase in right ventricular wall thickness. Global RV systolic function is has normal systolic function. Left Atrium: Left atrial size was normal in size. Right Atrium: Right atrial size was normal in size Pericardium: There is no evidence of pericardial effusion. Mitral Valve: The mitral valve is grossly normal. No evidence of mitral valve regurgitation. No evidence of mitral valve stenosis by observation. Tricuspid Valve: The tricuspid valve is not well visualized. Tricuspid valve regurgitation is trivial. Aortic Valve: The aortic valve is grossly normal. Aortic valve regurgitation is not visualized. Mild aortic valve sclerosis is present, with no evidence of aortic valve stenosis. Pulmonic Valve: The pulmonic valve was not well visualized. Pulmonic valve regurgitation is trivial. Pulmonic regurgitation is trivial. Aorta: The aortic root is normal in size and structure, the ascending aorta was not well visualized and the aortic arch was not well visualized. Venous: The inferior vena cava is normal in size with greater than 50% respiratory variability, suggesting right atrial pressure of 3 mmHg. IAS/Shunts: The interatrial septum was not well visualized.  LEFT VENTRICLE PLAX 2D LVIDd:         3.70 cm  Diastology LVIDs:         2.30 cm  LV e' lateral:   6.42 cm/s LV PW:         1.00 cm  LV E/e' lateral: 12.4 LV IVS:        1.00 cm  LV e' medial:    7.07 cm/s LVOT diam:     1.80 cm  LV E/e' medial:  11.2 LV SV:         40 ml LV SV Index:   20.65 LVOT Area:     2.54 cm  RIGHT VENTRICLE RV S prime:     16.90 cm/s TAPSE (M-mode): 2.4 cm LEFT ATRIUM             Index LA diam:        4.50 cm 2.39 cm/m LA Vol (A2C):   57.9 ml 30.71 ml/m LA Vol (A4C):   63.2 ml 33.52 ml/m LA Biplane Vol: 60.6 ml 32.14 ml/m  AORTIC VALVE LVOT Vmax:   154.00 cm/s LVOT Vmean:  104.000 cm/s LVOT VTI:    0.315 m  AORTA Ao Root diam: 2.80 cm MV E velocity: 79.30 cm/s 103 cm/s                                     SHUNTS                                    Systemic VTI:  0.32 m                                    Systemic Diam: 1.80 cm  Cherlynn Kaiser MD Electronically signed by Nadean Corwin  Margaretann Loveless MD Signature Date/Time: 09/16/2019/2:00:04 PM    Final     Scheduled Meds: . Derrill Memo ON 09/18/2019] amLODipine  10 mg Oral Daily  . aspirin EC  81 mg Oral Daily  . bisoprolol  5 mg Oral Daily  . Chlorhexidine Gluconate Cloth  6 each Topical Daily  . ciprofloxacin  2 drop Both Eyes Q4H while awake  . enoxaparin (LOVENOX) injection  40 mg Subcutaneous Q24H  . insulin aspart  0-24 Units Subcutaneous Q4H  . ipratropium-albuterol  3 mL Nebulization BID  . pantoprazole  40 mg Oral Daily  . rosuvastatin  20 mg Oral Daily  . sertraline  50 mg Oral Daily   Continuous Infusions: . sodium chloride 10 mL/hr at 09/16/19 2000  . cefTRIAXone (ROCEPHIN)  IV 2 g (09/17/19 1718)  . lactated ringers 75 mL/hr at 09/17/19 0336   PRN Meds:.sodium chloride, acetaminophen, docusate, hydrALAZINE, labetalol, ondansetron (ZOFRAN) IV  CARDIAC STUDIES:  EKG 09/15/2019: Sinus rhythm. Nonspecific ST-T changes anterolateral leads.  Telemetry: No arrhtymias  Echocardiogram 09/16/2019:  1. Suboptimal image quality to rule out an intracardiac source of emboli. No large or definite valvular lesions noted. Consider TEE if clinically indicated for source of stroke.  2. The interatrial septum was not well visualized.  3. Left ventricular ejection fraction, by visual estimation, is 70 to 75%. The left ventricle has hyperdynamic function. There is no left ventricular hypertrophy.  4. Left ventricular diastolic parameters are consistent with Grade I diastolic dysfunction (impaired relaxation).  5. Global right ventricle has normal systolic function.The right ventricular size is normal. No increase in right ventricular wall thickness.  6. Left atrial size was normal.  7. Right atrial size was normal.  8. The mitral valve is  grossly normal. No evidence of mitral valve regurgitation. No evidence of mitral stenosis.  9. The tricuspid valve is not well visualized. 10. The aortic valve is grossly normal. Aortic valve regurgitation is not visualized. Mild aortic valve sclerosis without stenosis. 11. The pulmonic valve was not well visualized. Pulmonic valve regurgitation is trivial. 12. The inferior vena cava is normal in size with greater than 50% respiratory variability, suggesting right atrial pressure of 3 mmHg. 13. The ascending aorta was not well visualized and the aortic arch was not well visualized.   Assessment & Recommendations:  74 y.o. Caucasian female  with hypertension, hyperlipidemia, admitted with altered mental status on 09/14/2018.  While troponin HS is minimally elevated, it is in the setting of elevated CK after being down for long time. This likely reflects probably rhabdomyolysis and not ACS. EKG has nonspecific findings. No arrhythmia noted on telemetry. I do not think her presentation was that of cardiac syncope. Holter monitor would not add any more information at this time.  Continue workup and management as per the primary team.  Cardiology signing off. Please contact if any further questions.     Nigel Mormon, MD 09/17/2019, 6:20 PM [facility] Pager: [phone] Office: [phone] If no answer Cell [phone]

## 2019-09-17 NOTE — Progress Notes (Signed)
   09/17/19 0810  MEWS Score  Temp 99 F (37.2 C)  BP (!) 202/90  Pulse Rate 99  ECG Heart Rate 99  Resp (!) 22  SpO2 96 %  O2 Device Nasal Cannula  MEWS Score  MEWS Temp 0  MEWS Systolic 2  MEWS Pulse 0  MEWS RR 1  MEWS LOC 0  MEWS Score 3  MEWS Score Color Yellow  MEWS Assessment  Is this an acute change? Yes  MEWS guidelines implemented *See Row Information* Yellow  Provider Notification  Provider Name/Title Dr Chauncey Cruel. Amin   Date Provider Notified 09/17/19  Time Provider Notified 0827  Notification Type Page  Notification Reason Change in status

## 2019-09-17 NOTE — Progress Notes (Signed)
   09/17/19 0943  MEWS Score  BP (!) 200/97  Pulse Rate 98  ECG Heart Rate (!) 102  Resp (!) 21  SpO2 93 %  MEWS Score  MEWS Temp 0  MEWS Systolic 0  MEWS Pulse 1  MEWS RR 1  MEWS LOC 0  MEWS Score 2  MEWS Score Color Yellow  MEWS Assessment  Is this an acute change? No  MEWS guidelines implemented *See Row Information* Yellow  Provider Notification  Provider Name/Title Dr. Latina Craver   Date Provider Notified 09/17/19  Time Provider Notified [phone]  Notification Type Page  Notification Reason Other (Comment)  Response Other (Comment) (verbal give labetolol )  Date of Provider Response 09/17/19  Time of Provider Response [phone]    RN spoke with MD. Verbal to give PRN labetalol.

## 2019-09-18 LAB — GLUCOSE, CAPILLARY
Glucose-Capillary: 106 mg/dL — ABNORMAL HIGH (ref 70–99)
Glucose-Capillary: 76 mg/dL (ref 70–99)
Glucose-Capillary: 87 mg/dL (ref 70–99)
Glucose-Capillary: 89 mg/dL (ref 70–99)
Glucose-Capillary: 91 mg/dL (ref 70–99)
Glucose-Capillary: 95 mg/dL (ref 70–99)

## 2019-09-18 LAB — CBC WITH DIFFERENTIAL/PLATELET
Abs Immature Granulocytes: 0.08 10*3/uL — ABNORMAL HIGH (ref 0.00–0.07)
Basophils Absolute: 0 10*3/uL (ref 0.0–0.1)
Basophils Relative: 0 %
Eosinophils Absolute: 0 10*3/uL (ref 0.0–0.5)
Eosinophils Relative: 0 %
HCT: 34.3 % — ABNORMAL LOW (ref 36.0–46.0)
Hemoglobin: 10.8 g/dL — ABNORMAL LOW (ref 12.0–15.0)
Immature Granulocytes: 1 %
Lymphocytes Relative: 16 %
Lymphs Abs: 1.7 10*3/uL (ref 0.7–4.0)
MCH: 26.7 pg (ref 26.0–34.0)
MCHC: 31.5 g/dL (ref 30.0–36.0)
MCV: 84.9 fL (ref 80.0–100.0)
Monocytes Absolute: 0.6 10*3/uL (ref 0.1–1.0)
Monocytes Relative: 5 %
Neutro Abs: 7.9 10*3/uL — ABNORMAL HIGH (ref 1.7–7.7)
Neutrophils Relative %: 78 %
Platelets: 249 10*3/uL (ref 150–400)
RBC: 4.04 MIL/uL (ref 3.87–5.11)
RDW: 15.9 % — ABNORMAL HIGH (ref 11.5–15.5)
WBC: 10.2 10*3/uL (ref 4.0–10.5)
nRBC: 0 % (ref 0.0–0.2)

## 2019-09-18 LAB — COMPREHENSIVE METABOLIC PANEL
ALT: 28 U/L (ref 0–44)
AST: 38 U/L (ref 15–41)
Albumin: 3 g/dL — ABNORMAL LOW (ref 3.5–5.0)
Alkaline Phosphatase: 60 U/L (ref 38–126)
Anion gap: 12 (ref 5–15)
BUN: 14 mg/dL (ref 8–23)
CO2: 23 mmol/L (ref 22–32)
Calcium: 8.9 mg/dL (ref 8.9–10.3)
Chloride: 104 mmol/L (ref 98–111)
Creatinine, Ser: 1.33 mg/dL — ABNORMAL HIGH (ref 0.44–1.00)
GFR calc Af Amer: 46 mL/min — ABNORMAL LOW (ref >60)
GFR calc non Af Amer: 39 mL/min — ABNORMAL LOW (ref >60)
Glucose, Bld: 94 mg/dL (ref 70–99)
Potassium: 3.9 mmol/L (ref 3.5–5.1)
Sodium: 139 mmol/L (ref 135–145)
Total Bilirubin: 1.1 mg/dL (ref 0.3–1.2)
Total Protein: 6.1 g/dL — ABNORMAL LOW (ref 6.5–8.1)

## 2019-09-18 LAB — PROCALCITONIN: Procalcitonin: 0.35 ng/mL

## 2019-09-18 NOTE — Progress Notes (Signed)
PROGRESS NOTE    Sherry Kent  [id number] DOB: 16-Apr-1946 DOA: 09/15/2019 PCP: Alycia Rossetti, MD   Brief Narrative:  Sherry Kent is a 74 year old female with a past medical history notable for CKD, HTN, HLD, impaired glucose intolerance, who was brought to the ED via EMS for AMS. Patient was very encephalopathic and agitated requiring EMS field intubation for airway protection.  On arrival she was febrile at [id number], neutrophilic predominant leukocytosis, UA with some leukocytes, PCT at 1.12, UDS with THC only, all cultures negative including CSF. EEG negative for seizures only show some encephalopathy.  CT head, cervical spine and chest x-ray all negative.  Neurology was consulted -could not find any neurologic disorder so the signed off.  No troponin checked in chart.  Echocardiogram with grade 1 diastolic dysfunction, no other abnormality.  Treated supportively with overall improvement - She could not remember any event prior to this incidence.  According to her she went take a shower and next thing she remembered was that she was waking up in the hospital   Subjective: No fever or chills  Assessment & Plan:   Active Problems:   Airway compromise   Acute renal injury ([place])   Acute encephalopathy   Metabolic encephalopathy   Acute cystitis without hematuria  Encephalopathy/syncopal episode.   Unknown etiology.  Work-up negative so far.  May be vasovagal.  No cardiac work-up in chart except echocardiogram which shows grade 1 diastolic dysfunction. Mental status improved.  Patient was alert and oriented x3.  Unable to recall anything.  Patient was initially treated with empiric antibiotics for meningitis including antiviral which were discontinued.  Currently on ceftriaxone for presumed UTI. -Check troponin negative for ACS. -Cardiology consult --No arrhythmia noted on telemetry. I do not think her presentation was that of cardiac syncope. Holter monitor would not add any more  information at this time PT OT consult as patient lives alone.  Hypertension.  Pressure elevated.  Patient was on amlodipine 5 mg and lisinopril 40 mg daily at home.  Lisinopril was placed on hold due to AKI. -Increased amlodipine to 10 mg daily. -Labetalol and hydralazine as needed. -Will restart lisinopril once renal function improves. -Restarted Bystolic, aspirin and Crestor.  AKI.  Creatinine peaked at 1.74 from 1.16.  Improving today, it was 1.41. Renal ultrasound was unremarkable. -Continue with gentle hydration for today. -Continue to monitor. -Avoid nephrotoxic -Resume lisinopril  if renal function continued to improve.  Rhabdomyolysis Gentle IV hydration with monitoring of renal fxn and electrolytes  UTI.  With positive bacteria and leukocytes.  Urine culture negative. -Antibiotic  Prediabetes.  A1c 6.0 -Continue to monitor.  .    Objective: Vitals:   09/18/19 0748 09/18/19 0751 09/18/19 0853 09/18/19 1144  BP: (!) 197/99 (!) 197/99 (!) 160/69 (!) 159/74  Pulse: 92   80  Resp: (!) 22   20  Temp: 98.5 F (36.9 C)   98.8 F (37.1 C)  TempSrc: Oral   Oral  SpO2: 96%   97%  Weight:      Height:        Intake/Output Summary (Last 24 hours) at 09/18/2019 1321 Last data filed at 09/18/2019 0600 Gross per 24 hour  Intake 2563.84 ml  Output --  Net 2563.84 ml   Filed Weights   09/15/19 2000 09/16/19 0400 09/17/19 0500  Weight: 79 kg 79 kg 79.3 kg    Examination:  General exam: Appears calm and comfortable.  Respiratory system: Clear to auscultation. Respiratory effort normal.  Cardiovascular system: S1 & S2 heard, RRR. No JVD, murmurs, rubs, gallops or clicks. Gastrointestinal system: Soft, nontender, nondistended, bowel sounds positive. Central nervous system: Alert and oriented. No focal neurological deficits.Symmetric 5 x 5 power. Extremities: No edema, no cyanosis, pulses intact and symmetrical. Skin: No rashes, lesions or ulcers Psychiatry: Judgement  and insight appear normal. Mood & affect appropriate.   DVT prophylaxis: Lovenox Code Status: Full Family Communication: No family at bedside Disposition Plan: Pending cardiology consult, most likely home tomorrow.  Consultants:   Neurology  Cardiology  PCCM  Procedures:  Antimicrobials:  Ceftriaxone.  Data Reviewed: I have personally reviewed following labs and imaging studies  CBC: Recent Labs  Lab 09/15/19 1322 09/15/19 1620 09/16/19 0311 09/16/19 0339 09/16/19 1830 09/17/19 0219 09/18/19 0509  WBC 32.1*  --  19.4*  --  20.0* 14.7* 10.2  NEUTROABS 28.9*  --   --   --   --  11.7* 7.9*  HGB 14.1   < > 13.5 11.6* 11.6* 10.7* 10.8*  HCT 45.1   < > 43.1 34.0* 37.0 33.4* 34.3*  MCV 85.7  --  85.2  --  84.7 83.9 84.9  PLT 470*  --  296  --  241 242 249   < > = values in this interval not displayed.   Basic Metabolic Panel: Recent Labs  Lab 09/15/19 2230 09/15/19 2230 09/16/19 0311 09/16/19 0339 09/16/19 1830 09/17/19 0219 09/18/19 0509  NA 141   < > 141 141 140 139 139  K 3.7   < > 3.9 3.4* 3.8 3.6 3.9  CL 112*  --  111  --  110 108 104  CO2 19*  --  17*  --  19* 21* 23  GLUCOSE 105*  --  116*  --  110* 102* 94  BUN 21  --  23  --  19 17 14   CREATININE 1.68*  --  1.66*  --  1.51* 1.41* 1.33*  CALCIUM 7.5*  --  8.0*  --  8.1* 8.4* 8.9  MG  --   --  1.7  --  2.4  --   --   PHOS  --   --  3.8  --  3.4  --   --    < > = values in this interval not displayed.   GFR: Estimated Creatinine Clearance: 39.4 mL/min (A) (by C-G formula based on SCr of 1.33 mg/dL (H)). Liver Function Tests: Recent Labs  Lab 09/15/19 1322 09/16/19 1830 09/17/19 0219 09/18/19 0509  AST 35 41 39 38  ALT 20 24 24 28   ALKPHOS 88 64 59 60  BILITOT 0.9 0.7 0.7 1.1  PROT 7.7 6.0* 5.8* 6.1*  ALBUMIN 4.0 3.1* 3.0* 3.0*   No results for input(s): LIPASE, AMYLASE in the last 168 hours. No results for input(s): AMMONIA in the last 168 hours. Coagulation Profile: Recent Labs  Lab  09/15/19 1632  INR 1.1   Cardiac Enzymes: Recent Labs  Lab 09/15/19 1732 09/16/19 1141 09/16/19 1830  CKTOTAL 920* 2,241* 1,915*   BNP (last 3 results) No results for input(s): PROBNP in the last 8760 hours. HbA1C: Recent Labs    09/15/19 1732  HGBA1C 6.0*   CBG: Recent Labs  Lab 09/17/19 2012 09/17/19 2330 09/18/19 0415 09/18/19 0747 09/18/19 1142  GLUCAP 92 85 76 89 87   Lipid Profile: Recent Labs    09/15/19 1632  TRIG 283*   Thyroid Function Tests: No results for input(s): TSH, T4TOTAL, FREET4, T3FREE, THYROIDAB  in the last 72 hours. Anemia Panel: No results for input(s): VITAMINB12, FOLATE, FERRITIN, TIBC, IRON, RETICCTPCT in the last 72 hours. Sepsis Labs: Recent Labs  Lab 09/15/19 1322 09/15/19 1632 09/15/19 2230 09/16/19 1141 09/17/19 0219 09/18/19 0509  PROCALCITON  --   --   --  1.25 0.84 0.35  LATICACIDVEN 6.5* 2.6* 1.9  --   --   --     Recent Results (from the past 240 hour(s))  Respiratory Panel by RT PCR (Flu A&B, Covid) - Nasopharyngeal Swab     Status: None   Collection Time: 09/15/19  1:22 PM   Specimen: Nasopharyngeal Swab  Result Value Ref Range Status   SARS Coronavirus 2 by RT PCR NEGATIVE NEGATIVE Final    Comment: (NOTE) SARS-CoV-2 target nucleic acids are NOT DETECTED. The SARS-CoV-2 RNA is generally detectable in upper respiratoy specimens during the acute phase of infection. The lowest concentration of SARS-CoV-2 viral copies this assay can detect is 131 copies/mL. A negative result does not preclude SARS-Cov-2 infection and should not be used as the sole basis for treatment or other patient management decisions. A negative result may occur with  improper specimen collection/handling, submission of specimen other than nasopharyngeal swab, presence of viral mutation(s) within the areas targeted by this assay, and inadequate number of viral copies (<131 copies/mL). A negative result must be combined with  clinical observations, patient history, and epidemiological information. The expected result is Negative. Fact Sheet for Patients:  PinkCheek.be Fact Sheet for Healthcare Providers:  GravelBags.it This test is not yet ap proved or cleared by the [place] FDA and  has been authorized for detection and/or diagnosis of SARS-CoV-2 by FDA under an Emergency Use Authorization (EUA). This EUA will remain  in effect (meaning this test can be used) for the duration of the COVID-19 declaration under Section 564(b)(1) of the Act, 21 U.S.C. section 360bbb-3(b)(1), unless the authorization is terminated or revoked sooner.    Influenza A by PCR NEGATIVE NEGATIVE Final   Influenza B by PCR NEGATIVE NEGATIVE Final    Comment: (NOTE) The Xpert Xpress SARS-CoV-2/FLU/RSV assay is intended as an aid in  the diagnosis of influenza from Nasopharyngeal swab specimens and  should not be used as a sole basis for treatment. Nasal washings and  aspirates are unacceptable for Xpert Xpress SARS-CoV-2/FLU/RSV  testing. Fact Sheet for Patients: PinkCheek.be Fact Sheet for Healthcare Providers: GravelBags.it This test is not yet approved or cleared by the [place] FDA and  has been authorized for detection and/or diagnosis of SARS-CoV-2 by  FDA under an Emergency Use Authorization (EUA). This EUA will remain  in effect (meaning this test can be used) for the duration of the  Covid-19 declaration under Section 564(b)(1) of the Act, 21  U.S.C. section 360bbb-3(b)(1), unless the authorization is  terminated or revoked. Performed at [facility], [place] [address], [place], [place] [address]   Urine culture     Status: None   Collection Time: 09/15/19  2:50 PM   Specimen: Urine, Random  Result Value Ref Range Status   Specimen Description URINE, RANDOM  Final   Special Requests NONE   Final   Culture   Final    NO GROWTH Performed at [facility], [place] [address], [place], [place] [address]    Report Status 09/16/2019 FINAL  Final  Blood culture (routine x 2)     Status: None (Preliminary result)   Collection Time: 09/15/19  4:05 PM  Specimen: BLOOD LEFT WRIST  Result Value Ref Range Status   Specimen Description BLOOD LEFT WRIST  Final   Special Requests   Final    BOTTLES DRAWN AEROBIC AND ANAEROBIC Blood Culture results may not be optimal due to an inadequate volume of blood received in culture bottles   Culture   Final    NO GROWTH 3 DAYS Performed at [facility], [place] [address], [place], [place] [address]    Report Status PENDING  Incomplete  Blood culture (routine x 2)     Status: None (Preliminary result)   Collection Time: 09/15/19  4:40 PM   Specimen: BLOOD  Result Value Ref Range Status   Specimen Description BLOOD RIGHT ANTECUBITAL  Final   Special Requests   Final    BOTTLES DRAWN AEROBIC AND ANAEROBIC Blood Culture adequate volume   Culture   Final    NO GROWTH 3 DAYS Performed at [facility], [place] [address], [place], [place] [address]    Report Status PENDING  Incomplete  CSF culture     Status: None (Preliminary result)   Collection Time: 09/15/19  6:20 PM   Specimen: CSF; Cerebrospinal Fluid  Result Value Ref Range Status   Specimen Description CSF  Final   Special Requests NONE  Final   Gram Stain   Final    WBC PRESENT,BOTH PMN AND MONONUCLEAR NO ORGANISMS SEEN CYTOSPIN SMEAR    Culture   Final    NO GROWTH 3 DAYS Performed at [facility], [place] [address], [place], [place] [address]    Report Status PENDING  Incomplete  MRSA PCR Screening     Status: None   Collection Time: 09/15/19  9:02 PM   Specimen: Nasal Mucosa; Nasopharyngeal  Result Value Ref Range Status   MRSA by PCR NEGATIVE NEGATIVE Final    Comment:        The GeneXpert MRSA Assay (FDA approved for NASAL specimens only), is one component  of a comprehensive MRSA colonization surveillance program. It is not intended to diagnose MRSA infection nor to guide or monitor treatment for MRSA infections. Performed at [facility], [place] [address], [place], [place] [address]      Radiology Studies: No results found.  Scheduled Meds: . amLODipine  10 mg Oral Daily  . aspirin EC  81 mg Oral Daily  . bisoprolol  5 mg Oral Daily  . Chlorhexidine Gluconate Cloth  6 each Topical Daily  . ciprofloxacin  2 drop Both Eyes Q4H while awake  . enoxaparin (LOVENOX) injection  40 mg Subcutaneous Q24H  . insulin aspart  0-24 Units Subcutaneous Q4H  . pantoprazole  40 mg Oral Daily  . rosuvastatin  20 mg Oral Daily  . sertraline  50 mg Oral Daily   Continuous Infusions: . sodium chloride 10 mL/hr at 09/16/19 2000  . cefTRIAXone (ROCEPHIN)  IV 2 g (09/18/19 0521)  . lactated ringers 75 mL/hr at 09/18/19 0518     LOS: 3 days   Time spent: 25 minutes.  I personally reviewed her chart.  Benito Mccreedy, MD [facility] Pager [phone]  If 7PM-7AM, please contact night-coverage [email or web address] Password [facility] 09/18/2019, 1:21 PM   This record has been created using Systems analyst. Errors have been sought and corrected,but may not always be located. Such creation errors do not reflect on the standard of care.

## 2019-09-19 LAB — COMPREHENSIVE METABOLIC PANEL
ALT: 33 U/L (ref 0–44)
AST: 36 U/L (ref 15–41)
Albumin: 3.4 g/dL — ABNORMAL LOW (ref 3.5–5.0)
Alkaline Phosphatase: 62 U/L (ref 38–126)
Anion gap: 15 (ref 5–15)
BUN: 11 mg/dL (ref 8–23)
CO2: 25 mmol/L (ref 22–32)
Calcium: 9.1 mg/dL (ref 8.9–10.3)
Chloride: 99 mmol/L (ref 98–111)
Creatinine, Ser: 1.1 mg/dL — ABNORMAL HIGH (ref 0.44–1.00)
GFR calc Af Amer: 57 mL/min — ABNORMAL LOW (ref >60)
GFR calc non Af Amer: 49 mL/min — ABNORMAL LOW (ref >60)
Glucose, Bld: 90 mg/dL (ref 70–99)
Potassium: 2.5 mmol/L — CL (ref 3.5–5.1)
Sodium: 139 mmol/L (ref 135–145)
Total Bilirubin: 0.7 mg/dL (ref 0.3–1.2)
Total Protein: 7.1 g/dL (ref 6.5–8.1)

## 2019-09-19 LAB — CBC WITH DIFFERENTIAL/PLATELET
Abs Immature Granulocytes: 0.08 10*3/uL — ABNORMAL HIGH (ref 0.00–0.07)
Basophils Absolute: 0 10*3/uL (ref 0.0–0.1)
Basophils Relative: 0 %
Eosinophils Absolute: 0 10*3/uL (ref 0.0–0.5)
Eosinophils Relative: 0 %
HCT: 40 % (ref 36.0–46.0)
Hemoglobin: 12.5 g/dL (ref 12.0–15.0)
Immature Granulocytes: 1 %
Lymphocytes Relative: 19 %
Lymphs Abs: 2 10*3/uL (ref 0.7–4.0)
MCH: 26.2 pg (ref 26.0–34.0)
MCHC: 31.3 g/dL (ref 30.0–36.0)
MCV: 83.7 fL (ref 80.0–100.0)
Monocytes Absolute: 0.7 10*3/uL (ref 0.1–1.0)
Monocytes Relative: 6 %
Neutro Abs: 8 10*3/uL — ABNORMAL HIGH (ref 1.7–7.7)
Neutrophils Relative %: 74 %
Platelets: 289 10*3/uL (ref 150–400)
RBC: 4.78 MIL/uL (ref 3.87–5.11)
RDW: 15.6 % — ABNORMAL HIGH (ref 11.5–15.5)
WBC: 10.8 10*3/uL — ABNORMAL HIGH (ref 4.0–10.5)
nRBC: 0 % (ref 0.0–0.2)

## 2019-09-19 LAB — CSF CULTURE W GRAM STAIN: Culture: NO GROWTH

## 2019-09-19 LAB — BASIC METABOLIC PANEL
Anion gap: 13 (ref 5–15)
BUN: 11 mg/dL (ref 8–23)
CO2: 28 mmol/L (ref 22–32)
Calcium: 8.8 mg/dL — ABNORMAL LOW (ref 8.9–10.3)
Chloride: 98 mmol/L (ref 98–111)
Creatinine, Ser: 1.03 mg/dL — ABNORMAL HIGH (ref 0.44–1.00)
GFR calc Af Amer: >60 mL/min (ref >60)
GFR calc non Af Amer: 54 mL/min — ABNORMAL LOW (ref >60)
Glucose, Bld: 100 mg/dL — ABNORMAL HIGH (ref 70–99)
Potassium: 3.3 mmol/L — ABNORMAL LOW (ref 3.5–5.1)
Sodium: 139 mmol/L (ref 135–145)

## 2019-09-19 LAB — GLUCOSE, CAPILLARY
Glucose-Capillary: 88 mg/dL (ref 70–99)
Glucose-Capillary: 87 mg/dL (ref 70–99)
Glucose-Capillary: 92 mg/dL (ref 70–99)
Glucose-Capillary: 89 mg/dL (ref 70–99)
Glucose-Capillary: 91 mg/dL (ref 70–99)

## 2019-09-19 LAB — MAGNESIUM: Magnesium: 1.8 mg/dL (ref 1.7–2.4)

## 2019-09-19 MED ORDER — SODIUM CHLORIDE 0.9 % IV SOLN: INTRAVENOUS | Status: DC | PRN

## 2019-09-19 MED ORDER — POTASSIUM CHLORIDE 10 MEQ/100ML IV SOLN
10.0000 meq | INTRAVENOUS | Status: AC
  Administered 2019-09-19 (×3): 10 meq via INTRAVENOUS
  Filled 2019-09-19 (×3): qty 100

## 2019-09-19 NOTE — Progress Notes (Signed)
Occupational Therapy Treatment and Discharge Patient Details Name: Sherry Kent MRN: [id number] DOB: 03-02-1946 Today's Date: 09/19/2019    History of present illness Sherry Kent is a 74 year old female with a past medical history notable for CKD, HTN, HLD, impaired glucose intolerance, who was brought to the ED via EMS for AMS. Patient agitated and placed on vent for brain imaging, thought to be due to UTI with treatment and some confusion earlier in the week.   OT comments  This 74 yo female is not at an independent to Mod I level for all basic ADLs. Her BP is what is basically keeping her here prn RN. No further OT needs, we will sign off.  Follow Up Recommendations  No OT follow up    Equipment Recommendations  None recommended by OT       Precautions / Restrictions Precautions Precautions: Fall Precaution Comments: watch BP Restrictions Weight Bearing Restrictions: No       Mobility Bed Mobility Overal bed mobility: Independent                Transfers Overall transfer level: Independent               General transfer comment: Pt ambulated around entire 4NP/4N unit without AD and not LOB    Balance Overall balance assessment: No apparent balance deficits (not formally assessed)                                         ADL either performed or assessed with clinical judgement   ADL Overall ADL's : Modified independent Eating/Feeding: Independent   Grooming: Wash/dry hands;Wash/dry face;Oral care;Brushing hair;Standing;Independent                   Toilet Transfer: Modified Independent;Comfort height toilet   Toileting- Clothing Manipulation and Hygiene: Modified independent;Sit to/from stand         General ADL Comments: We discussed that she would need a shower seat if she was going to shower (for safety and endurance), we also discussed the sequence and positioning of getting dressed that is most efficient and safe      Vision Patient Visual Report: No change from baseline            Cognition Arousal/Alertness: Awake/alert Behavior During Therapy: WFL for tasks assessed/performed Overall Cognitive Status: Within Functional Limits for tasks assessed                                                     Pertinent Vitals/ Pain       Pain Assessment: No/denies pain         Frequency  Min 2X/week        Progress Toward Goals    Progress towards OT goals: Goals met/education completed, patient discharged from OT            AM-PAC OT "6 Clicks" Daily Activity     Outcome Measure   Help from another person eating meals?: None Help from another person taking care of personal grooming?: None Help from another person toileting, which includes using toliet, bedpan, or urinal?: None Help from another person bathing (including washing, rinsing, drying)?: None Help from another person to put on and taking off  regular upper body clothing?: None Help from another person to put on and taking off regular lower body clothing?: None 6 Click Score: 24    End of Session Equipment Utilized During Treatment: Gait belt  OT Visit Diagnosis: Muscle weakness (generalized) (M62.81)   Activity Tolerance Patient tolerated treatment well   Patient Left in chair;with call bell/phone within reach;with chair alarm set   Nurse Communication  BP went up with activity but still lower than before she gave her medications.        Time: 0658-2608 OT Time Calculation (min): 33 min  Charges: OT General Charges $OT Visit: 1 Visit OT Treatments $Self Care/Home Management : 23-37 mins   Tye [place], OTR/L [place] Pager [phone] Office [phone]     09/19/2019, 11:59 AM

## 2019-09-19 NOTE — Progress Notes (Signed)
CRITICAL VALUE ALERT  Critical Value:  Potassium 2.5  Date & Time Notied:  09/19/19  Provider Notified: DR. Vista Lawman  Orders Received/Actions taken: Three runs of potassium then recheck potassium at [place]

## 2019-09-19 NOTE — Progress Notes (Signed)
PROGRESS NOTE    [name]  [id number] DOB: Jun 26, 1946 DOA: 09/15/2019 PCP: Alycia Rossetti, MD   Brief Narrative:  Sherry Kent is a 74 year old female with a past medical history notable for CKD, HTN, HLD, impaired glucose intolerance, who was brought to the ED via EMS for AMS. Patient was very encephalopathic and agitated requiring EMS field intubation for airway protection.  On arrival she was febrile at [id number], neutrophilic predominant leukocytosis, UA with some leukocytes, PCT at 1.12, UDS with THC only, all cultures negative including CSF. EEG negative for seizures only show some encephalopathy.  CT head, cervical spine and chest x-ray all negative.  Neurology was consulted -could not find any neurologic disorder so the signed off.  No troponin checked in chart.  Echocardiogram with grade 1 diastolic dysfunction, no other abnormality.  Treated supportively with overall improvement - She could not remember any event prior to this incidence.  According to her she went take a shower and next thing she remembered was that she was waking up in the hospital   Subjective: No fever or chills  Assessment & Plan:   Active Problems:   Airway compromise   Acute renal injury ([place])   Acute encephalopathy   Metabolic encephalopathy   Acute cystitis without hematuria   Hypokalemia Severe, 2.5 today IV KCL runs with f/u K, Mg Pt with Rhabdo  Rhabdomyolysis Gentle IV hydration with monitoring of renal fxn and electrolytes  UTI.   With positive bacteria and leukocytes.   Urine culture negative. Cont Antibiotic    AKI.   Creatinine peaked at 1.74 from 1.16.   Improving  Renal ultrasound was unremarkable. Continue with gentle hydration for today.  -Avoid nephrotoxic -Resume lisinopril  if renal function continued to improve.  Encephalopathy/syncopal episode.  Resolved  Unknown etiology.  Work-up negative so far.  May be vasovagal.  No cardiac work-up in chart except  echocardiogram which shows grade 1 diastolic dysfunction. Mental status improved.  Patient was alert and oriented x3.  Unable to recall anything.  Patient was initially treated with empiric antibiotics for meningitis including antiviral which were discontinued.  Currently on ceftriaxone for presumed UTI. -Check troponin negative for ACS. -Cardiology consult --No arrhythmia noted on telemetry. I do not think her presentation was that of cardiac syncope. Holter monitor would not add any more information at this time PT OT consult as patient lives alone.  Hypertension.   -Pt was on amlodipine 5 mg and lisinopril 40 mg daily at home.   -Lisinopril on hold due to AKI. -Increased amlodipine to 10 mg daily. -Labetalol and hydralazine as needed. -Consider resuming lisinopril once renal function improves. .       Prediabetes.  A1c 6.0 -Continue to monitor.  .    Objective: Vitals:   09/19/19 0352 09/19/19 0800 09/19/19 0823 09/19/19 1148  BP: (!) 181/84 (!) 191/90 (!) 161/71 (!) 130/93  Pulse: 89 84 88   Resp: 15 18    Temp: 98.5 F (36.9 C) 98.5 F (36.9 C)  98.2 F (36.8 C)  TempSrc: Oral Oral  Oral  SpO2: 97% 100% 99%   Weight:      Height:        Intake/Output Summary (Last 24 hours) at 09/19/2019 1353 Last data filed at 09/19/2019 0800 Gross per 24 hour  Intake 1693.97 ml  Output --  Net 1693.97 ml   Filed Weights   09/15/19 2000 09/16/19 0400 09/17/19 0500  Weight: 79 kg 79 kg 79.3 kg  Examination:  General exam:  Comfortable, NAD.  Respiratory system: Clear to auscultation.  Cardiovascular system:  RRR. No JVD, murmurs, rubs, gallops or clicks. Gastrointestinal system: Soft, nontender, nondistended, bowel sounds positive. Central nervous system: Alert and oriented.  Extremities: No edema, no cyanosis, pulses intact and symmetrical. Skin: No rashes, lesions or ulcers Psychiatry: Judgement and insight appear normal. Mood & affect appropriate.   DVT  prophylaxis: Lovenox Code Status: Full Family Communication: Nephew at bedside Disposition Plan: cardiology consult, most likely home tomorrow after K repletion.  Consultants:   Neurology  Cardiology  PCCM  Procedures:  Antimicrobials:  Ceftriaxone.  Data Reviewed: I have personally reviewed following labs and imaging studies  CBC: Recent Labs  Lab 09/15/19 1322 09/15/19 1620 09/16/19 0311 09/16/19 0311 09/16/19 0339 09/16/19 1830 09/17/19 0219 09/18/19 0509 09/19/19 0810  WBC 32.1*   < > 19.4*  --   --  20.0* 14.7* 10.2 10.8*  NEUTROABS 28.9*  --   --   --   --   --  11.7* 7.9* 8.0*  HGB 14.1   < > 13.5   < > 11.6* 11.6* 10.7* 10.8* 12.5  HCT 45.1   < > 43.1   < > 34.0* 37.0 33.4* 34.3* 40.0  MCV 85.7   < > 85.2  --   --  84.7 83.9 84.9 83.7  PLT 470*   < > 296  --   --  241 242 249 289   < > = values in this interval not displayed.   Basic Metabolic Panel: Recent Labs  Lab 09/16/19 0311 09/16/19 0311 09/16/19 0339 09/16/19 1830 09/17/19 0219 09/18/19 0509 09/19/19 0810  NA 141   < > 141 140 139 139 139  K 3.9   < > 3.4* 3.8 3.6 3.9 2.5*  CL 111  --   --  110 108 104 99  CO2 17*  --   --  19* 21* 23 25  GLUCOSE 116*  --   --  110* 102* 94 90  BUN 23  --   --  19 17 14 11   CREATININE 1.66*  --   --  1.51* 1.41* 1.33* 1.10*  CALCIUM 8.0*  --   --  8.1* 8.4* 8.9 9.1  MG 1.7  --   --  2.4  --   --   --   PHOS 3.8  --   --  3.4  --   --   --    < > = values in this interval not displayed.   GFR: Estimated Creatinine Clearance: 47.7 mL/min (A) (by C-G formula based on SCr of 1.1 mg/dL (H)). Liver Function Tests: Recent Labs  Lab 09/15/19 1322 09/16/19 1830 09/17/19 0219 09/18/19 0509 09/19/19 0810  AST 35 41 39 38 36  ALT 20 24 24 28  33  ALKPHOS 88 64 59 60 62  BILITOT 0.9 0.7 0.7 1.1 0.7  PROT 7.7 6.0* 5.8* 6.1* 7.1  ALBUMIN 4.0 3.1* 3.0* 3.0* 3.4*   No results for input(s): LIPASE, AMYLASE in the last 168 hours. No results for input(s):  AMMONIA in the last 168 hours. Coagulation Profile: Recent Labs  Lab 09/15/19 1632  INR 1.1   Cardiac Enzymes: Recent Labs  Lab 09/15/19 1732 09/16/19 1141 09/16/19 1830  CKTOTAL 920* 2,241* 1,915*   BNP (last 3 results) No results for input(s): PROBNP in the last 8760 hours. HbA1C: No results for input(s): HGBA1C in the last 72 hours. CBG: Recent Labs  Lab 09/18/19  1938 09/18/19 2352 09/19/19 0355 09/19/19 0754 09/19/19 1144  GLUCAP 106* 95 88 87 92   Lipid Profile: No results for input(s): CHOL, HDL, LDLCALC, TRIG, CHOLHDL, LDLDIRECT in the last 72 hours. Thyroid Function Tests: No results for input(s): TSH, T4TOTAL, FREET4, T3FREE, THYROIDAB in the last 72 hours. Anemia Panel: No results for input(s): VITAMINB12, FOLATE, FERRITIN, TIBC, IRON, RETICCTPCT in the last 72 hours. Sepsis Labs: Recent Labs  Lab 09/15/19 1322 09/15/19 1632 09/15/19 2230 09/16/19 1141 09/17/19 0219 09/18/19 0509  PROCALCITON  --   --   --  1.25 0.84 0.35  LATICACIDVEN 6.5* 2.6* 1.9  --   --   --     Recent Results (from the past 240 hour(s))  Respiratory Panel by RT PCR (Flu A&B, Covid) - Nasopharyngeal Swab     Status: None   Collection Time: 09/15/19  1:22 PM   Specimen: Nasopharyngeal Swab  Result Value Ref Range Status   SARS Coronavirus 2 by RT PCR NEGATIVE NEGATIVE Final    Comment: (NOTE) SARS-CoV-2 target nucleic acids are NOT DETECTED. The SARS-CoV-2 RNA is generally detectable in upper respiratoy specimens during the acute phase of infection. The lowest concentration of SARS-CoV-2 viral copies this assay can detect is 131 copies/mL. A negative result does not preclude SARS-Cov-2 infection and should not be used as the sole basis for treatment or other patient management decisions. A negative result may occur with  improper specimen collection/handling, submission of specimen other than nasopharyngeal swab, presence of viral mutation(s) within the areas targeted by  this assay, and inadequate number of viral copies (<131 copies/mL). A negative result must be combined with clinical observations, patient history, and epidemiological information. The expected result is Negative. Fact Sheet for Patients:  PinkCheek.be Fact Sheet for Healthcare Providers:  GravelBags.it This test is not yet ap proved or cleared by the [place] FDA and  has been authorized for detection and/or diagnosis of SARS-CoV-2 by FDA under an Emergency Use Authorization (EUA). This EUA will remain  in effect (meaning this test can be used) for the duration of the COVID-19 declaration under Section 564(b)(1) of the Act, 21 U.S.C. section 360bbb-3(b)(1), unless the authorization is terminated or revoked sooner.    Influenza A by PCR NEGATIVE NEGATIVE Final   Influenza B by PCR NEGATIVE NEGATIVE Final    Comment: (NOTE) The Xpert Xpress SARS-CoV-2/FLU/RSV assay is intended as an aid in  the diagnosis of influenza from Nasopharyngeal swab specimens and  should not be used as a sole basis for treatment. Nasal washings and  aspirates are unacceptable for Xpert Xpress SARS-CoV-2/FLU/RSV  testing. Fact Sheet for Patients: PinkCheek.be Fact Sheet for Healthcare Providers: GravelBags.it This test is not yet approved or cleared by the [place] FDA and  has been authorized for detection and/or diagnosis of SARS-CoV-2 by  FDA under an Emergency Use Authorization (EUA). This EUA will remain  in effect (meaning this test can be used) for the duration of the  Covid-19 declaration under Section 564(b)(1) of the Act, 21  U.S.C. section 360bbb-3(b)(1), unless the authorization is  terminated or revoked. Performed at [facility], [place] [address], [place], [place] [address]   Urine culture     Status: None   Collection Time: 09/15/19  2:50 PM   Specimen: Urine,  Random  Result Value Ref Range Status   Specimen Description URINE, RANDOM  Final   Special Requests NONE  Final   Culture   Final    NO  GROWTH Performed at [facility], [place] [address] [place], [place] [address]    Report Status 09/16/2019 FINAL  Final  Blood culture (routine x 2)     Status: None (Preliminary result)   Collection Time: 09/15/19  4:05 PM   Specimen: BLOOD LEFT WRIST  Result Value Ref Range Status   Specimen Description BLOOD LEFT WRIST  Final   Special Requests   Final    BOTTLES DRAWN AEROBIC AND ANAEROBIC Blood Culture results may not be optimal due to an inadequate volume of blood received in culture bottles   Culture   Final    NO GROWTH 4 DAYS Performed at [facility], [place] [address] [place], [place] [address]    Report Status PENDING  Incomplete  Blood culture (routine x 2)     Status: None (Preliminary result)   Collection Time: 09/15/19  4:40 PM   Specimen: BLOOD  Result Value Ref Range Status   Specimen Description BLOOD RIGHT ANTECUBITAL  Final   Special Requests   Final    BOTTLES DRAWN AEROBIC AND ANAEROBIC Blood Culture adequate volume   Culture   Final    NO GROWTH 4 DAYS Performed at [facility], [place] [address] [place], [place] [address]    Report Status PENDING  Incomplete  CSF culture     Status: None   Collection Time: 09/15/19  6:20 PM   Specimen: CSF; Cerebrospinal Fluid  Result Value Ref Range Status   Specimen Description CSF  Final   Special Requests NONE  Final   Gram Stain   Final    WBC PRESENT,BOTH PMN AND MONONUCLEAR NO ORGANISMS SEEN CYTOSPIN SMEAR    Culture   Final    NO GROWTH 3 DAYS Performed at [facility], [place] [address], [place], [place] [address]    Report Status 09/19/2019 FINAL  Final  MRSA PCR Screening     Status: None   Collection Time: 09/15/19  9:02 PM   Specimen: Nasal Mucosa; Nasopharyngeal  Result Value Ref Range Status   MRSA by PCR NEGATIVE NEGATIVE Final    Comment:         The GeneXpert MRSA Assay (FDA approved for NASAL specimens only), is one component of a comprehensive MRSA colonization surveillance program. It is not intended to diagnose MRSA infection nor to guide or monitor treatment for MRSA infections. Performed at [facility], [place] [address], [place],  [address]      Radiology Studies: No results found.  Scheduled Meds: . amLODipine  10 mg Oral Daily  . aspirin EC  81 mg Oral Daily  . bisoprolol  5 mg Oral Daily  . Chlorhexidine Gluconate Cloth  6 each Topical Daily  . ciprofloxacin  2 drop Both Eyes Q4H while awake  . enoxaparin (LOVENOX) injection  40 mg Subcutaneous Q24H  . insulin aspart  0-24 Units Subcutaneous Q4H  . pantoprazole  40 mg Oral Daily  . rosuvastatin  20 mg Oral Daily  . sertraline  50 mg Oral Daily   Continuous Infusions: . sodium chloride    . cefTRIAXone (ROCEPHIN)  IV 2 g (09/19/19 0528)  . lactated ringers 75 mL/hr at 09/18/19 2005     LOS: 4 days     Benito Mccreedy, MD [facility] Pager [phone]  If 7PM-7AM, please contact night-coverage [email or web address] Password [facility] 09/19/2019, 1:53 PM   This record has been created using Systems analyst. Errors have been  sought and corrected,but may not always be located. Such creation errors do not reflect on the standard of care.

## 2019-09-20 LAB — CULTURE, BLOOD (ROUTINE X 2)
Culture: NO GROWTH
Culture: NO GROWTH
Special Requests: ADEQUATE

## 2019-09-20 LAB — CBC WITH DIFFERENTIAL/PLATELET
Abs Immature Granulocytes: 0.11 10*3/uL — ABNORMAL HIGH (ref 0.00–0.07)
Basophils Absolute: 0 10*3/uL (ref 0.0–0.1)
Basophils Relative: 0 %
Eosinophils Absolute: 0 10*3/uL (ref 0.0–0.5)
Eosinophils Relative: 0 %
HCT: 37.2 % (ref 36.0–46.0)
Hemoglobin: 12.1 g/dL (ref 12.0–15.0)
Immature Granulocytes: 1 %
Lymphocytes Relative: 22 %
Lymphs Abs: 2.1 10*3/uL (ref 0.7–4.0)
MCH: 26.8 pg (ref 26.0–34.0)
MCHC: 32.5 g/dL (ref 30.0–36.0)
MCV: 82.3 fL (ref 80.0–100.0)
Monocytes Absolute: 0.8 10*3/uL (ref 0.1–1.0)
Monocytes Relative: 8 %
Neutro Abs: 6.4 10*3/uL (ref 1.7–7.7)
Neutrophils Relative %: 69 %
Platelets: 290 10*3/uL (ref 150–400)
RBC: 4.52 MIL/uL (ref 3.87–5.11)
RDW: 15.3 % (ref 11.5–15.5)
WBC: 9.4 10*3/uL (ref 4.0–10.5)
nRBC: 0 % (ref 0.0–0.2)

## 2019-09-20 LAB — COMPREHENSIVE METABOLIC PANEL
ALT: 27 U/L (ref 0–44)
AST: 26 U/L (ref 15–41)
Albumin: 3.2 g/dL — ABNORMAL LOW (ref 3.5–5.0)
Alkaline Phosphatase: 59 U/L (ref 38–126)
Anion gap: 16 — ABNORMAL HIGH (ref 5–15)
BUN: 9 mg/dL (ref 8–23)
CO2: 26 mmol/L (ref 22–32)
Calcium: 8.7 mg/dL — ABNORMAL LOW (ref 8.9–10.3)
Chloride: 96 mmol/L — ABNORMAL LOW (ref 98–111)
Creatinine, Ser: 1.11 mg/dL — ABNORMAL HIGH (ref 0.44–1.00)
GFR calc Af Amer: 57 mL/min — ABNORMAL LOW (ref >60)
GFR calc non Af Amer: 49 mL/min — ABNORMAL LOW (ref >60)
Glucose, Bld: 89 mg/dL (ref 70–99)
Potassium: 2.7 mmol/L — CL (ref 3.5–5.1)
Sodium: 138 mmol/L (ref 135–145)
Total Bilirubin: 1 mg/dL (ref 0.3–1.2)
Total Protein: 6.5 g/dL (ref 6.5–8.1)

## 2019-09-20 LAB — GLUCOSE, CAPILLARY
Glucose-Capillary: 106 mg/dL — ABNORMAL HIGH (ref 70–99)
Glucose-Capillary: 82 mg/dL (ref 70–99)
Glucose-Capillary: 83 mg/dL (ref 70–99)
Glucose-Capillary: 84 mg/dL (ref 70–99)
Glucose-Capillary: 84 mg/dL (ref 70–99)
Glucose-Capillary: 85 mg/dL (ref 70–99)
Glucose-Capillary: 97 mg/dL (ref 70–99)
Glucose-Capillary: 98 mg/dL (ref 70–99)
Glucose-Capillary: 99 mg/dL (ref 70–99)

## 2019-09-20 LAB — POTASSIUM: Potassium: 3.2 mmol/L — ABNORMAL LOW (ref 3.5–5.1)

## 2019-09-20 LAB — PHOSPHORUS: Phosphorus: 3 mg/dL (ref 2.5–4.6)

## 2019-09-20 LAB — CK: Total CK: 536 U/L — ABNORMAL HIGH (ref 38–234)

## 2019-09-20 MED ORDER — MAGNESIUM SULFATE 2 GM/50ML IV SOLN
2.0000 g | Freq: Once | INTRAVENOUS | Status: AC
  Administered 2019-09-20: 2 g via INTRAVENOUS
  Filled 2019-09-20: qty 50

## 2019-09-20 MED ORDER — POTASSIUM CHLORIDE CRYS ER 20 MEQ PO TBCR
40.0000 meq | EXTENDED_RELEASE_TABLET | ORAL | Status: AC
  Administered 2019-09-20 (×2): 40 meq via ORAL
  Filled 2019-09-20 (×2): qty 2

## 2019-09-20 MED ORDER — HYDRALAZINE HCL 10 MG PO TABS
10.0000 mg | ORAL_TABLET | Freq: Once | ORAL | Status: AC
  Administered 2019-09-20: 10 mg via ORAL
  Filled 2019-09-20: qty 1

## 2019-09-20 MED ORDER — POTASSIUM CHLORIDE 10 MEQ/100ML IV SOLN
10.0000 meq | INTRAVENOUS | Status: AC
  Administered 2019-09-20 (×2): 10 meq via INTRAVENOUS
  Filled 2019-09-20 (×2): qty 100

## 2019-09-20 MED ORDER — POTASSIUM CHLORIDE CRYS ER 20 MEQ PO TBCR
40.0000 meq | EXTENDED_RELEASE_TABLET | Freq: Once | ORAL | Status: AC
  Administered 2019-09-20: 40 meq via ORAL
  Filled 2019-09-20: qty 2

## 2019-09-20 MED ORDER — BISOPROLOL FUMARATE 10 MG PO TABS
10.0000 mg | ORAL_TABLET | Freq: Every day | ORAL | Status: DC
  Administered 2019-09-21: 10 mg via ORAL
  Filled 2019-09-20: qty 1

## 2019-09-20 MED ORDER — SODIUM CHLORIDE 0.9 % IV SOLN
1.0000 g | INTRAVENOUS | Status: DC
  Administered 2019-09-21: 1 g via INTRAVENOUS
  Filled 2019-09-20: qty 1

## 2019-09-20 NOTE — Progress Notes (Signed)
Physical Therapy Treatment Patient Details Name: Sherry Kent MRN: [id number] DOB: 1946/04/20 Today's Date: 09/20/2019    History of Present Illness Sherry Kent is a 74 year old female with a past medical history notable for CKD, HTN, HLD, impaired glucose intolerance, who was brought to the ED via EMS for AMS. Patient agitated and placed on vent for brain imaging, thought to be due to UTI with treatment and some confusion earlier in the week.    PT Comments    Pt is at baseline functioning and should be safe at home with 1-2 days of nieces company. There are no further acute PT needs.  Will sign off at this time.    Follow Up Recommendations  Supervision/Assistance - 24 hour     Equipment Recommendations  None recommended by PT    Recommendations for Other Services       Precautions / Restrictions Precautions Precautions: (minimal fall risk)    Mobility  Bed Mobility Overal bed mobility: Independent                Transfers Overall transfer level: Independent                  Ambulation/Gait Ambulation/Gait assistance: Independent     Gait Pattern/deviations: WFL(Within Functional Limits)   Gait velocity interpretation: >4.37 ft/sec, indicative of normal walking speed General Gait Details: see DGI   Stairs Stairs: Yes   Stair Management: No rails;Alternating pattern;Forwards Number of Stairs: 3 General stair comments: safe with/without rail   Wheelchair Mobility    Modified Rankin (Stroke Patients Only)       Balance     Sitting balance-Leahy Scale: Normal       Standing balance-Leahy Scale: Good                   Standardized Balance Assessment Standardized Balance Assessment : Dynamic Gait Index   Dynamic Gait Index Level Surface: Normal Change in Gait Speed: Normal Gait with Horizontal Head Turns: Normal Gait with Vertical Head Turns: Mild Impairment Gait and Pivot Turn: Normal Step Over Obstacle: Normal Step  Around Obstacles: Normal Steps: Normal Total Score: 23      Cognition Arousal/Alertness: Awake/alert Behavior During Therapy: WFL for tasks assessed/performed Overall Cognitive Status: Within Functional Limits for tasks assessed                                        Exercises      General Comments        Pertinent Vitals/Pain      Home Living                      Prior Function            PT Goals (current goals can now be found in the care plan section) Acute Rehab PT Goals Patient Stated Goal: to go home PT Goal Formulation: With patient Time For Goal Achievement: 09/30/19 Potential to Achieve Goals: Good Progress towards PT goals: Progressing toward goals    Frequency    Min 3X/week      PT Plan Current plan remains appropriate    Co-evaluation              AM-PAC PT "6 Clicks" Mobility   Outcome Measure  Help needed turning from your back to your side while in a flat bed without using bedrails?: None Help  needed moving from lying on your back to sitting on the side of a flat bed without using bedrails?: None Help needed moving to and from a bed to a chair (including a wheelchair)?: None Help needed standing up from a chair using your arms (e.g., wheelchair or bedside chair)?: None Help needed to walk in hospital room?: None Help needed climbing 3-5 steps with a railing? : None 6 Click Score: 24    End of Session   Activity Tolerance: Patient tolerated treatment well Patient left: in bed;with call bell/phone within reach Nurse Communication: Mobility status PT Visit Diagnosis: Other abnormalities of gait and mobility (R26.89)     Time: 1008-1020 PT Time Calculation (min) (ACUTE ONLY): 12 min  Charges:  $Gait Training: 8-22 mins                     09/20/2019  Ginger Carne., PT Acute Rehabilitation Services [phone]  (pager) [phone]  (office)   Sherry Kent 09/20/2019, 10:25 AM

## 2019-09-20 NOTE — Progress Notes (Signed)
PROGRESS NOTE  Sherry Kent [id number] DOB: 25-Jun-1946 DOA: 09/15/2019 PCP: Alycia Rossetti, MD   LOS: 5 days   Brief Narrative / Interim history: 74 year old female with HTN, HLD, impaired glucose tolerance, who was brought to the ED via EMS for acute encephalopathy.  She was very encephalopathic and agitated requiring failed intubation and she was admitted to the ICU.  On arrival she was febrile with evidence of a UTI.  She underwent a UDS which was positive for THC only, all cultures were negative including C SF.  EEG was negative for seizures only showed encephalopathy.  CT head, C-spine and chest x-ray all negative.  Neurology has been consulted and could not find any neurologic disorder so signed off.  Echocardiogram showed grade 1 diastolic dysfunction without any other abnormalities.  She was treated supportively with overall improvement, on antibiotics for presumed culture-negative UTI.  Hospital course slightly complicated by persistent hypokalemia and persistent hypertension  Subjective / 24h Interval events: She is doing well today, hoping to go home  Assessment & Plan: Principal Problem Acute metabolic encephalopathy due to probable UTI, syncopal episode -Unclear etiology, patient tells me that she was showering and next thing she remembers 6 hours later was being in the hospital.  She had significant leukocytosis with a white count of 32.1, elevated lactic acid and potentially UTI for which she is being treated -She underwent a spinal tap on admission which was unremarkable -It is possible that she did have a UTI, cultures are negative but seems to be responding to antibiotics and will keep on ceftriaxone.  Decrease the dose of ceftriaxone to 1 g daily -Due to syncope cardiology was consulted without recommending further work-up as elevated CK was thought to be due to being down and not ACS  Active Problems Acute kidney injury -Creatinine peaked at 1.74 from baseline of  1.1.  Currently returned to baseline with IV fluids.  Hypertensive urgency -Blood pressure this afternoon [id number] systolic.  She is asymptomatic.  Currently on amlodipine 10 mg, bisoprolol 5 mg and hydralazine as needed.  Will give hydralazine as needed x1 and add a one-time dose of hydralazine oral this evening -Increase bisoprolol to 10 mg starting tomorrow -At home she was on lisinopril but this is now being hold due to acute kidney injury  Hypokalemia -Possibly in the setting of increased urine output post acute kidney injury, repleted aggressively today and improved from 2.7 in the morning to 3.2 in the afternoon. -Also received magnesium today -We will give an additional dose of potassium tonight and repeat tomorrow morning  Scheduled Meds: . amLODipine  10 mg Oral Daily  . aspirin EC  81 mg Oral Daily  . bisoprolol  5 mg Oral Daily  . Chlorhexidine Gluconate Cloth  6 each Topical Daily  . ciprofloxacin  2 drop Both Eyes Q4H while awake  . enoxaparin (LOVENOX) injection  40 mg Subcutaneous Q24H  . insulin aspart  0-24 Units Subcutaneous Q4H  . pantoprazole  40 mg Oral Daily  . rosuvastatin  20 mg Oral Daily  . sertraline  50 mg Oral Daily   Continuous Infusions: . sodium chloride 100 mL/hr at 09/19/19 1454  . cefTRIAXone (ROCEPHIN)  IV 2 g (09/20/19 0507)  . lactated ringers 75 mL/hr at 09/18/19 2005   PRN Meds:.sodium chloride, acetaminophen, docusate, hydrALAZINE, ipratropium-albuterol, labetalol, ondansetron (ZOFRAN) IV  DVT prophylaxis: Lovenox Code Status: Full code Family Communication: Discussed with patient, no family at bedside Patient admitted from: Home Anticipated d/c place:  Home Barriers to d/c: Persistent hypokalemia, significant hypertension  Consultants:  Cardiology Neurology Critical care  Procedures:  2D echo:  IMPRESSIONS  1. Suboptimal image quality to rule out an intracardiac source of emboli. No large or definite valvular lesions noted.  Consider TEE if clinically indicated for source of stroke.  2. The interatrial septum was not well visualized.  3. Left ventricular ejection fraction, by visual estimation, is 70 to 75%. The left ventricle has hyperdynamic function. There is no left ventricular hypertrophy.  4. Left ventricular diastolic parameters are consistent with Grade I diastolic dysfunction (impaired relaxation).  5. Global right ventricle has normal systolic function.The right ventricular size is normal. No increase in right ventricular wall thickness.  6. Left atrial size was normal.  7. Right atrial size was normal.  8. The mitral valve is grossly normal. No evidence of mitral valve regurgitation. No evidence of mitral stenosis.  9. The tricuspid valve is not well visualized. 10. The aortic valve is grossly normal. Aortic valve regurgitation is not visualized. Mild aortic valve sclerosis without stenosis. 11. The pulmonic valve was not well visualized. Pulmonic valve regurgitation is trivial. 12. The inferior vena cava is normal in size with greater than 50% respiratory variability, suggesting right atrial pressure of 3 mmHg. 13. The ascending aorta was not well visualized and the aortic arch was not well visualized.  Microbiology  Blood cultures 1/13-no growth CSF culture 1/13-no growth Urine cultures 1/13-no growth MRSA PCR screening 1/13-negative SARS-CoV-2 1/13-negative  Antimicrobials: Ceftriaxone >>   Objective: Vitals:   09/20/19 0349 09/20/19 0745 09/20/19 1150 09/20/19 1600  BP: (!) 159/74 (!) 154/66 (!) 145/82 (!) 183/80  Pulse: 78 94 85 76  Resp:  20 12 17   Temp: 98.3 F (36.8 C) 98.2 F (36.8 C) 98 F (36.7 C) 99.1 F (37.3 C)  TempSrc: Oral Oral Oral Oral  SpO2:  97% 99% 95%  Weight:      Height:        Intake/Output Summary (Last 24 hours) at 09/20/2019 1658 Last data filed at 09/20/2019 1200 Gross per 24 hour  Intake 2205.42 ml  Output --  Net 2205.42 ml   Filed Weights    09/15/19 2000 09/16/19 0400 09/17/19 0500  Weight: 79 kg 79 kg 79.3 kg    Examination:  Constitutional: NAD Eyes: no scleral icterus ENMT: Mucous membranes are moist.  Neck: normal, supple Respiratory: clear to auscultation bilaterally, no wheezing, no crackles. Normal respiratory effort.  Cardiovascular: Regular rate and rhythm, no murmurs / rubs / gallops. No LE edema. Good peripheral pulses Abdomen: non distended, no tenderness. Bowel sounds positive.  Musculoskeletal: no clubbing / cyanosis.  Skin: no rashes Neurologic: CN 2-12 grossly intact. Strength 5/5 in all 4.  Psychiatric: Normal judgment and insight. Alert and oriented x 3. Normal mood.    Data Reviewed: I have independently reviewed following labs and imaging studies   CBC: Recent Labs  Lab 09/15/19 1322 09/15/19 1620 09/16/19 1830 09/17/19 0219 09/18/19 0509 09/19/19 0810 09/20/19 0708  WBC 32.1*   < > 20.0* 14.7* 10.2 10.8* 9.4  NEUTROABS 28.9*  --   --  11.7* 7.9* 8.0* 6.4  HGB 14.1   < > 11.6* 10.7* 10.8* 12.5 12.1  HCT 45.1   < > 37.0 33.4* 34.3* 40.0 37.2  MCV 85.7   < > 84.7 83.9 84.9 83.7 82.3  PLT 470*   < > 241 242 249 289 290   < > = values in this interval not displayed.  Basic Metabolic Panel: Recent Labs  Lab 09/16/19 0311 09/16/19 0339 09/16/19 1830 09/16/19 1830 09/17/19 0219 09/17/19 0219 09/18/19 0509 09/19/19 0810 09/19/19 1437 09/20/19 0708 09/20/19 1426  NA 141   < > 140   < > 139  --  139 139 139 138  --   K 3.9   < > 3.8   < > 3.6   < > 3.9 2.5* 3.3* 2.7* 3.2*  CL 111   < > 110   < > 108  --  104 99 98 96*  --   CO2 17*   < > 19*   < > 21*  --  23 25 28 26   --   GLUCOSE 116*   < > 110*   < > 102*  --  94 90 100* 89  --   BUN 23   < > 19   < > 17  --  14 11 11 9   --   CREATININE 1.66*   < > 1.51*   < > 1.41*  --  1.33* 1.10* 1.03* 1.11*  --   CALCIUM 8.0*   < > 8.1*   < > 8.4*  --  8.9 9.1 8.8* 8.7*  --   MG 1.7  --  2.4  --   --   --   --   --  1.8  --   --   PHOS 3.8   --  3.4  --   --   --   --   --   --  3.0  --    < > = values in this interval not displayed.   Liver Function Tests: Recent Labs  Lab 09/16/19 1830 09/17/19 0219 09/18/19 0509 09/19/19 0810 09/20/19 0708  AST 41 39 38 36 26  ALT 24 24 28  33 27  ALKPHOS 64 59 60 62 59  BILITOT 0.7 0.7 1.1 0.7 1.0  PROT 6.0* 5.8* 6.1* 7.1 6.5  ALBUMIN 3.1* 3.0* 3.0* 3.4* 3.2*   Coagulation Profile: Recent Labs  Lab 09/15/19 1632  INR 1.1   HbA1C: No results for input(s): HGBA1C in the last 72 hours. CBG: Recent Labs  Lab 09/19/19 2356 09/20/19 0349 09/20/19 0719 09/20/19 1145 09/20/19 1605  GLUCAP 85 84 84 83 106*    Recent Results (from the past 240 hour(s))  Respiratory Panel by RT PCR (Flu A&B, Covid) - Nasopharyngeal Swab     Status: None   Collection Time: 09/15/19  1:22 PM   Specimen: Nasopharyngeal Swab  Result Value Ref Range Status   SARS Coronavirus 2 by RT PCR NEGATIVE NEGATIVE Final    Comment: (NOTE) SARS-CoV-2 target nucleic acids are NOT DETECTED. The SARS-CoV-2 RNA is generally detectable in upper respiratoy specimens during the acute phase of infection. The lowest concentration of SARS-CoV-2 viral copies this assay can detect is 131 copies/mL. A negative result does not preclude SARS-Cov-2 infection and should not be used as the sole basis for treatment or other patient management decisions. A negative result may occur with  improper specimen collection/handling, submission of specimen other than nasopharyngeal swab, presence of viral mutation(s) within the areas targeted by this assay, and inadequate number of viral copies (<131 copies/mL). A negative result must be combined with clinical observations, patient history, and epidemiological information. The expected result is Negative. Fact Sheet for Patients:  PinkCheek.be Fact Sheet for Healthcare Providers:  GravelBags.it This test is not yet ap  proved or cleared by the [place] and  has been authorized for detection and/or diagnosis of SARS-CoV-2 by FDA under an Emergency Use Authorization (EUA). This EUA will remain  in effect (meaning this test can be used) for the duration of the COVID-19 declaration under Section 564(b)(1) of the Act, 21 U.S.C. section 360bbb-3(b)(1), unless the authorization is terminated or revoked sooner.    Influenza A by PCR NEGATIVE NEGATIVE Final   Influenza B by PCR NEGATIVE NEGATIVE Final    Comment: (NOTE) The Xpert Xpress SARS-CoV-2/FLU/RSV assay is intended as an aid in  the diagnosis of influenza from Nasopharyngeal swab specimens and  should not be used as a sole basis for treatment. Nasal washings and  aspirates are unacceptable for Xpert Xpress SARS-CoV-2/FLU/RSV  testing. Fact Sheet for Patients: PinkCheek.be Fact Sheet for Healthcare Providers: GravelBags.it This test is not yet approved or cleared by the [place] FDA and  has been authorized for detection and/or diagnosis of SARS-CoV-2 by  FDA under an Emergency Use Authorization (EUA). This EUA will remain  in effect (meaning this test can be used) for the duration of the  Covid-19 declaration under Section 564(b)(1) of the Act, 21  U.S.C. section 360bbb-3(b)(1), unless the authorization is  terminated or revoked. Performed at [facility], [place] [address] [place], [place] [address]   Urine culture     Status: None   Collection Time: 09/15/19  2:50 PM   Specimen: Urine, Random  Result Value Ref Range Status   Specimen Description URINE, RANDOM  Final   Special Requests NONE  Final   Culture   Final    NO GROWTH Performed at [facility], [place] [address], [place], [place] [address]    Report Status 09/16/2019 FINAL  Final  Blood culture (routine x 2)     Status: None   Collection Time: 09/15/19  4:05 PM   Specimen: BLOOD LEFT WRIST  Result  Value Ref Range Status   Specimen Description BLOOD LEFT WRIST  Final   Special Requests   Final    BOTTLES DRAWN AEROBIC AND ANAEROBIC Blood Culture results may not be optimal due to an inadequate volume of blood received in culture bottles   Culture   Final    NO GROWTH 5 DAYS Performed at [facility], [place] [address], [place], [place] [address]    Report Status 09/20/2019 FINAL  Final  Blood culture (routine x 2)     Status: None   Collection Time: 09/15/19  4:40 PM   Specimen: BLOOD  Result Value Ref Range Status   Specimen Description BLOOD RIGHT ANTECUBITAL  Final   Special Requests   Final    BOTTLES DRAWN AEROBIC AND ANAEROBIC Blood Culture adequate volume   Culture   Final    NO GROWTH 5 DAYS Performed at [facility], [place] [address], [place], [place] [address]    Report Status 09/20/2019 FINAL  Final  CSF culture     Status: None   Collection Time: 09/15/19  6:20 PM   Specimen: CSF; Cerebrospinal Fluid  Result Value Ref Range Status   Specimen Description CSF  Final   Special Requests NONE  Final   Gram Stain   Final    WBC PRESENT,BOTH PMN AND MONONUCLEAR NO ORGANISMS SEEN CYTOSPIN SMEAR    Culture   Final    NO GROWTH 3 DAYS Performed at [facility], [place] [address] [place], [place] [address]    Report Status 09/19/2019 FINAL  Final  MRSA PCR Screening     Status: None   Collection Time: 09/15/19  9:02 PM   Specimen: Nasal Mucosa; Nasopharyngeal  Result Value Ref Range Status   MRSA by PCR NEGATIVE NEGATIVE Final    Comment:        The GeneXpert MRSA Assay (FDA approved for NASAL specimens only), is one component of a comprehensive MRSA colonization surveillance program. It is not intended to diagnose MRSA infection nor to guide or monitor treatment for MRSA infections. Performed at [facility], [place] [address], [place], [place] [address]      Radiology Studies: No results found.  Marzetta Board, MD, PhD [facility]  Between 7 am - 7 pm I am available, please contact me via Amion or Securechat  Between 7 pm - 7 am I am not available, please contact night coverage MD/APP via Amion

## 2019-09-20 NOTE — Progress Notes (Signed)
The reason shes staying is for high blood pressure

## 2019-09-21 LAB — CBC WITH DIFFERENTIAL/PLATELET
Abs Immature Granulocytes: 0.09 10*3/uL — ABNORMAL HIGH (ref 0.00–0.07)
Basophils Absolute: 0.1 10*3/uL (ref 0.0–0.1)
Basophils Relative: 1 %
Eosinophils Absolute: 0.1 10*3/uL (ref 0.0–0.5)
Eosinophils Relative: 1 %
HCT: 40.7 % (ref 36.0–46.0)
Hemoglobin: 12.9 g/dL (ref 12.0–15.0)
Immature Granulocytes: 1 %
Lymphocytes Relative: 29 %
Lymphs Abs: 2.4 10*3/uL (ref 0.7–4.0)
MCH: 26.6 pg (ref 26.0–34.0)
MCHC: 31.7 g/dL (ref 30.0–36.0)
MCV: 83.9 fL (ref 80.0–100.0)
Monocytes Absolute: 0.7 10*3/uL (ref 0.1–1.0)
Monocytes Relative: 9 %
Neutro Abs: 4.8 10*3/uL (ref 1.7–7.7)
Neutrophils Relative %: 59 %
Platelets: 298 10*3/uL (ref 150–400)
RBC: 4.85 MIL/uL (ref 3.87–5.11)
RDW: 15.6 % — ABNORMAL HIGH (ref 11.5–15.5)
WBC: 8.2 10*3/uL (ref 4.0–10.5)
nRBC: 0 % (ref 0.0–0.2)

## 2019-09-21 LAB — COMPREHENSIVE METABOLIC PANEL
ALT: 31 U/L (ref 0–44)
AST: 34 U/L (ref 15–41)
Albumin: 3.4 g/dL — ABNORMAL LOW (ref 3.5–5.0)
Alkaline Phosphatase: 63 U/L (ref 38–126)
Anion gap: 11 (ref 5–15)
BUN: 11 mg/dL (ref 8–23)
CO2: 27 mmol/L (ref 22–32)
Calcium: 8.9 mg/dL (ref 8.9–10.3)
Chloride: 103 mmol/L (ref 98–111)
Creatinine, Ser: 1.16 mg/dL — ABNORMAL HIGH (ref 0.44–1.00)
GFR calc Af Amer: 54 mL/min — ABNORMAL LOW (ref >60)
GFR calc non Af Amer: 46 mL/min — ABNORMAL LOW (ref >60)
Glucose, Bld: 93 mg/dL (ref 70–99)
Potassium: 3.3 mmol/L — ABNORMAL LOW (ref 3.5–5.1)
Sodium: 141 mmol/L (ref 135–145)
Total Bilirubin: 0.6 mg/dL (ref 0.3–1.2)
Total Protein: 6.4 g/dL — ABNORMAL LOW (ref 6.5–8.1)

## 2019-09-21 LAB — GLUCOSE, CAPILLARY
Glucose-Capillary: 93 mg/dL (ref 70–99)
Glucose-Capillary: 87 mg/dL (ref 70–99)

## 2019-09-21 MED ORDER — POTASSIUM CHLORIDE CRYS ER 20 MEQ PO TBCR: 20.0000 meq | EXTENDED_RELEASE_TABLET | Freq: Every day | ORAL | Status: DC

## 2019-09-21 MED ORDER — POTASSIUM CHLORIDE CRYS ER 20 MEQ PO TBCR
40.0000 meq | EXTENDED_RELEASE_TABLET | Freq: Once | ORAL | Status: AC
  Administered 2019-09-21: 40 meq via ORAL
  Filled 2019-09-21: qty 2

## 2019-09-21 MED ORDER — POTASSIUM CHLORIDE CRYS ER 20 MEQ PO TBCR: 20.0000 meq | EXTENDED_RELEASE_TABLET | Freq: Two times a day (BID) | ORAL | 0 refills | Status: DC

## 2019-09-21 MED ORDER — AMLODIPINE BESYLATE 10 MG PO TABS: 10.0000 mg | ORAL_TABLET | Freq: Every day | ORAL | 0 refills | Status: DC

## 2019-09-21 MED ORDER — BISOPROLOL FUMARATE 10 MG PO TABS: 10.0000 mg | ORAL_TABLET | Freq: Every day | ORAL | 0 refills | Status: DC

## 2019-09-21 NOTE — Discharge Summary (Addendum)
Physician Discharge Summary  Sherry Kent [id number] DOB: [birth year] DOA: 09/15/2019  PCP: Alycia Rossetti, MD  Admit date: 09/15/2019 Discharge date: 09/21/2019  Admitted From: home Disposition:  home  Recommendations for Outpatient Follow-up:  1. Follow up with PCP in 1-2 weeks 2. Please obtain BMP/CBC in one week 3. Please follow up on the following pending results:  Home Health:no  Equipment/Devices: none  Discharge Condition: Stable Code Status: full Diet recommendation: Heart Healthy  Brief/Interim Summary:  74 year old female with HTN, HLD, impaired glucose tolerance, who was brought to the ED via EMS for acute encephalopathy.  She was very encephalopathic and agitated requiring intubation and she was admitted to the ICU.On arrival she was febrile with evidence of a UTI.  She underwent a UDS which was positive for THC only, all cultures were negative including CSF.  EEG was negative for seizures only showed encephalopathy.  CT head, C-spine and chest x-ray all negative.  Neurology has been consulted and could not find any neurologic disorder so signed off.  Echocardiogram showed grade 1 diastolic dysfunction without any other abnormalities.  She was treated supportively with overall improvement, on antibiotics for presumed culture-negative UTI.  Hospital course slightly complicated by persistent hypokalemia and persistent hypertension. Seen by PT no further PT needs.Patient clinically improved at this time she is alert awake oriented x3, able to ambulate, no new complaints. Discharge was held on 1/18 due to persistent hypotension, bibasilar pleural increased and today blood pressure stable in 0000000 systolic potassium low and repleted. Patient feels well and wants to go home today. She reports her niece is coming to live with her to provide supervision assistance at home  Discharge Diagnoses:  Acute metabolic encephalopathy due to probable UTI/syncopal episode: Seen by  neurology, cardiology.  Acute kidney injury resolved Hypertension urgency blood pressure has now finally stabilized she will continue on amlodipine 10 mg, bisoprolol 10 mg and lisinopril remains on hold due to AKI. Hypokalemia: Potassium level improving.  Will discharge around oral potassium chloride and advised to follow-up with PCP to repeat potassium level Culture negative UTI-treated with ceftriaxone.  Switch to oral Keflex for 2 more days.( Of note AVS does not show keflex- I have called Pharmacy at [facility] and ordered Rx for keflex and patient was called and informed)  Consults: Cardiology, neurology,PCCM.  Subjective: Alert awake oriented x3, BP Improved- [id number] systolic this morning denies any complaint chest pain nausea vomiting.  Wants to go home today.  Discharge Exam: Vitals:   09/21/19 0716 09/21/19 1044  BP: (!) 166/73 (!) 145/69  Pulse: 94 69  Resp: 18 10  Temp: 98.3 F (36.8 C)   SpO2: 99% 98%   General: Pt is alert, awake, not in acute distress Cardiovascular: RRR, S1/S2 +, no rubs, no gallops Respiratory: CTA bilaterally, no wheezing, no rhonchi Abdominal: Soft, NT, ND, bowel sounds + Extremities: no edema, no cyanosis  Discharge Instructions  Discharge Instructions    Diet - low sodium heart healthy   Complete by: As directed    Discharge instructions   Complete by: As directed    Please call call MD or return to ER for similar or worsening recurring problem that brought you to hospital or if any fever,nausea/vomiting,abdominal pain, uncontrolled pain, chest pain,  shortness of breath or any other alarming symptoms.  Please follow-up your doctor as instructed in a week time and call the office for appointment.  Please avoid alcohol, smoking, or any other illicit substance and maintain healthy habits including taking  your regular medications as prescribed.  You were cared for by a hospitalist during your hospital stay. If you have any questions about your  discharge medications or the care you received while you were in the hospital after you are discharged, you can call the unit and ask to speak with the hospitalist on call if the hospitalist that took care of you is not available.  Once you are discharged, your primary care physician will handle any further medical issues. Please note that NO REFILLS for any discharge medications will be authorized once you are discharged, as it is imperative that you return to your primary care physician (or establish a relationship with a primary care physician if you do not have one) for your aftercare needs so that they can reassess your need for medications and monitor your lab values  Monitor blood pressure at home and follow-up with PCP to adjust medication Follow-up with PCP in 5 to 7 days to check your potassium level   Increase activity slowly   Complete by: As directed      Allergies as of 09/21/2019      Reactions   Norvasc [amlodipine Besylate] Swelling   10mg  dose causes swelling (07/19/2015).  Tolerates 5mg  with no swelling.      Medication List    STOP taking these medications   Advil PM 200-25 MG Caps Generic drug: Ibuprofen-diphenhydrAMINE HCl   lisinopril 40 MG tablet Commonly known as: ZESTRIL   meloxicam 7.5 MG tablet Commonly known as: MOBIC   nebivolol 5 MG tablet Commonly known as: Bystolic     TAKE these medications   amLODipine 10 MG tablet Commonly known as: NORVASC Take 1 tablet (10 mg total) by mouth daily. Start taking on: September 22, 2019   aspirin EC 81 MG tablet Take 81 mg by mouth daily.   benzonatate 100 MG capsule Commonly known as: TESSALON Take 1 capsule (100 mg total) by mouth 3 (three) times daily as needed for cough.   bisoprolol 10 MG tablet Commonly known as: ZEBETA Take 1 tablet (10 mg total) by mouth daily. Start taking on: September 22, 2019   CALCIUM 1200 PO Take by mouth. In divided doses   Cholecalciferol 100 MCG (4000 UT)  Caps Commonly known as: SM Vitamin D3 Take 1 capsule (4,000 Units total) by mouth daily.   omeprazole 20 MG capsule Commonly known as: PRILOSEC TAKE 1 CAPSULE(20 MG) BY MOUTH DAILY What changed: See the new instructions.   ondansetron 4 MG tablet Commonly known as: Zofran Take 1 tablet (4 mg total) by mouth every 8 (eight) hours as needed for nausea or vomiting.   potassium chloride SA 20 MEQ tablet Commonly known as: KLOR-CON Take 1 tablet (20 mEq total) by mouth 2 (two) times daily for 7 days.   rosuvastatin 20 MG tablet Commonly known as: CRESTOR TAKE 1 TABLET(20 MG) BY MOUTH DAILY What changed: See the new instructions.   sertraline 50 MG tablet Commonly known as: ZOLOFT TAKE 1 TABLET(50 MG) BY MOUTH DAILY What changed: See the new instructions.      Follow-up Information    [place], Modena Nunnery, MD Follow up in 1 week(s).   Specialty: Family Medicine Why: for bmp check Contact information: [address] [place] [address] [phone]          Allergies  Allergen Reactions  . Norvasc [Amlodipine Besylate] Swelling    10mg  dose causes swelling (07/19/2015).  Tolerates 5mg  with no swelling.  The results of significant diagnostics from this hospitalization (including imaging, microbiology, ancillary and laboratory) are listed below for reference.    Microbiology: Recent Results (from the past 240 hour(s))  Respiratory Panel by RT PCR (Flu A&B, Covid) - Nasopharyngeal Swab     Status: None   Collection Time: 09/15/19  1:22 PM   Specimen: Nasopharyngeal Swab  Result Value Ref Range Status   SARS Coronavirus 2 by RT PCR NEGATIVE NEGATIVE Final    Comment: (NOTE) SARS-CoV-2 target nucleic acids are NOT DETECTED. The SARS-CoV-2 RNA is generally detectable in upper respiratoy specimens during the acute phase of infection. The lowest concentration of SARS-CoV-2 viral copies this assay can detect is 131 copies/mL. A negative result does not preclude  SARS-Cov-2 infection and should not be used as the sole basis for treatment or other patient management decisions. A negative result may occur with  improper specimen collection/handling, submission of specimen other than nasopharyngeal swab, presence of viral mutation(s) within the areas targeted by this assay, and inadequate number of viral copies (<131 copies/mL). A negative result must be combined with clinical observations, patient history, and epidemiological information. The expected result is Negative. Fact Sheet for Patients:  PinkCheek.be Fact Sheet for Healthcare Providers:  GravelBags.it This test is not yet ap proved or cleared by the [place] FDA and  has been authorized for detection and/or diagnosis of SARS-CoV-2 by FDA under an Emergency Use Authorization (EUA). This EUA will remain  in effect (meaning this test can be used) for the duration of the COVID-19 declaration under Section 564(b)(1) of the Act, 21 U.S.C. section 360bbb-3(b)(1), unless the authorization is terminated or revoked sooner.    Influenza A by PCR NEGATIVE NEGATIVE Final   Influenza B by PCR NEGATIVE NEGATIVE Final    Comment: (NOTE) The Xpert Xpress SARS-CoV-2/FLU/RSV assay is intended as an aid in  the diagnosis of influenza from Nasopharyngeal swab specimens and  should not be used as a sole basis for treatment. Nasal washings and  aspirates are unacceptable for Xpert Xpress SARS-CoV-2/FLU/RSV  testing. Fact Sheet for Patients: PinkCheek.be Fact Sheet for Healthcare Providers: GravelBags.it This test is not yet approved or cleared by the [place] FDA and  has been authorized for detection and/or diagnosis of SARS-CoV-2 by  FDA under an Emergency Use Authorization (EUA). This EUA will remain  in effect (meaning this test can be used) for the duration of the  Covid-19  declaration under Section 564(b)(1) of the Act, 21  U.S.C. section 360bbb-3(b)(1), unless the authorization is  terminated or revoked. Performed at [facility], [place] [address], [place], [place] [address]   Urine culture     Status: None   Collection Time: 09/15/19  2:50 PM   Specimen: Urine, Random  Result Value Ref Range Status   Specimen Description URINE, RANDOM  Final   Special Requests NONE  Final   Culture   Final    NO GROWTH Performed at [facility], [place] [address] [place], [place] [address]    Report Status 09/16/2019 FINAL  Final  Blood culture (routine x 2)     Status: None   Collection Time: 09/15/19  4:05 PM   Specimen: BLOOD LEFT WRIST  Result Value Ref Range Status   Specimen Description BLOOD LEFT WRIST  Final   Special Requests   Final    BOTTLES DRAWN AEROBIC AND ANAEROBIC Blood Culture results may not be optimal due to an inadequate volume of blood received in  culture bottles   Culture   Final    NO GROWTH 5 DAYS Performed at [facility], [place] [address], [place], [place] [address]    Report Status 09/20/2019 FINAL  Final  Blood culture (routine x 2)     Status: None   Collection Time: 09/15/19  4:40 PM   Specimen: BLOOD  Result Value Ref Range Status   Specimen Description BLOOD RIGHT ANTECUBITAL  Final   Special Requests   Final    BOTTLES DRAWN AEROBIC AND ANAEROBIC Blood Culture adequate volume   Culture   Final    NO GROWTH 5 DAYS Performed at [facility], [place] [address] [place], [place] [address]    Report Status 09/20/2019 FINAL  Final  CSF culture     Status: None   Collection Time: 09/15/19  6:20 PM   Specimen: CSF; Cerebrospinal Fluid  Result Value Ref Range Status   Specimen Description CSF  Final   Special Requests NONE  Final   Gram Stain   Final    WBC PRESENT,BOTH PMN AND MONONUCLEAR NO ORGANISMS SEEN CYTOSPIN SMEAR    Culture   Final    NO GROWTH 3 DAYS Performed at [facility], [place]  [address] [place], [place] [address]    Report Status 09/19/2019 FINAL  Final  MRSA PCR Screening     Status: None   Collection Time: 09/15/19  9:02 PM   Specimen: Nasal Mucosa; Nasopharyngeal  Result Value Ref Range Status   MRSA by PCR NEGATIVE NEGATIVE Final    Comment:        The GeneXpert MRSA Assay (FDA approved for NASAL specimens only), is one component of a comprehensive MRSA colonization surveillance program. It is not intended to diagnose MRSA infection nor to guide or monitor treatment for MRSA infections. Performed at [facility], [place] [address], [place], [place] [address]     Procedures/Studies: DG Wrist 2 Views Left  Result Date: 09/15/2019 CLINICAL DATA:  Found down in shower, confused, disoriented EXAM: LEFT WRIST - 2 VIEW COMPARISON:  None. FINDINGS: No fracture or dislocation of the left wrist. The carpus is normally aligned. Mild arthrosis. Soft tissues are unremarkable. IMPRESSION: No fracture or dislocation of the left wrist. The carpus is normally aligned. Electronically Signed   By: Eddie Candle M.D.   On: 09/15/2019 14:32   DG Abd 1 View  Result Date: 09/16/2019 CLINICAL DATA:  74 year old female enteric tube placement. EXAM: ABDOMEN - 1 VIEW COMPARISON:  None. FINDINGS: Portable AP semi upright view at 0318 hours. Enteric tube courses into the stomach with side hole at the level of the gastric body. Mild streaky opacity at the left lung base. Visible right lung base appears negative. Visualized bowel gas pattern is non obstructed. Scoliosis. No acute osseous abnormality identified. IMPRESSION: Enteric tube placed into the stomach with side hole at the level of the gastric body. Electronically Signed   By: Genevie Ann M.D.   On: 09/16/2019 03:50   CT Head Wo Contrast  Result Date: 09/15/2019 CLINICAL DATA:  Found down in the shower.  Altered mental status. EXAM: CT HEAD WITHOUT CONTRAST CT CERVICAL SPINE WITHOUT CONTRAST TECHNIQUE: Multidetector CT imaging of the  head and cervical spine was performed following the standard protocol without intravenous contrast. Multiplanar CT image reconstructions of the cervical spine were also generated. COMPARISON:  MR brain and CT head dated December 11, 2011. FINDINGS: CT HEAD FINDINGS Brain: No evidence of  acute infarction, hemorrhage, hydrocephalus, extra-axial collection or mass lesion/mass effect. Stable mild chronic microvascular ischemic changes. Vascular: No hyperdense vessel or unexpected calcification. Skull: Normal. Negative for fracture or focal lesion. Sinuses/Orbits: Pansinus mucosal thickening. Air-fluid level in the right maxillary sinus. The mastoid air cells are clear. The orbits are unremarkable. Other: None. CT CERVICAL SPINE FINDINGS Alignment: Normal. Skull base and vertebrae: No acute fracture. No primary bone lesion or focal pathologic process. 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 Soft tissues and spinal canal: No prevertebral fluid or swelling. No visible canal hematoma. Disc levels: Severe disc height loss and advanced uncovertebral hypertrophy from C4-C5 through C6-C7. Upper chest: Mild emphysema. Other: None. IMPRESSION: 1.  No acute intracranial abnormality. 2.  No acute cervical spine fracture. Electronically Signed   By: Titus Dubin M.D.   On: 09/15/2019 13:12   CT Cervical Spine Wo Contrast  Result Date: 09/15/2019 CLINICAL DATA:  Found down in the shower.  Altered mental status. EXAM: CT HEAD WITHOUT CONTRAST CT CERVICAL SPINE WITHOUT CONTRAST TECHNIQUE: Multidetector CT imaging of the head and cervical spine was performed following the standard protocol without intravenous contrast. Multiplanar CT image reconstructions of the cervical spine were also generated. COMPARISON:  MR brain and CT head dated December 11, 2011. FINDINGS: CT HEAD FINDINGS Brain: No evidence of acute infarction, hemorrhage, hydrocephalus, extra-axial collection or mass lesion/mass effect. Stable mild chronic  microvascular ischemic changes. Vascular: No hyperdense vessel or unexpected calcification. Skull: Normal. Negative for fracture or focal lesion. Sinuses/Orbits: Pansinus mucosal thickening. Air-fluid level in the right maxillary sinus. The mastoid air cells are clear. The orbits are unremarkable. Other: None. CT CERVICAL SPINE FINDINGS Alignment: Normal. Skull base and vertebrae: No acute fracture. No primary bone lesion or focal pathologic process. 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 Soft tissues and spinal canal: No prevertebral fluid or swelling. No visible canal hematoma. Disc levels: Severe disc height loss and advanced uncovertebral hypertrophy from C4-C5 through C6-C7. Upper chest: Mild emphysema. Other: None. IMPRESSION: 1.  No acute intracranial abnormality. 2.  No acute cervical spine fracture. Electronically Signed   By: Titus Dubin M.D.   On: 09/15/2019 13:12   US RENAL  Result Date: 09/15/2019 CLINICAL DATA:  AKI EXAM: RENAL / URINARY TRACT ULTRASOUND COMPLETE COMPARISON:  None. FINDINGS: Right Kidney: Renal measurements: 10 x 4.4 x 4.1 cm = volume: 92.6 mL . Echogenicity within normal limits. No mass or hydronephrosis visualized. Left Kidney: Renal measurements: 10.3 x 5.4 x 3.5 cm = volume: 101.9 mL. Echogenicity within normal limits. No mass or hydronephrosis visualized. Bladder: Decompressed by Foley catheter and therefore poorly assessed by sonography. Other: Patient is intubated at the time of exam with inability to reposition patient. IMPRESSION: Unremarkable appearance of the kidneys. Bladder is decompressed by Foley catheter. Technically difficult exam secondary to inability to reposition this intubated patient. Electronically Signed   By: Lovena Le M.D.   On: 09/15/2019 19:36   DG Pelvis Portable  Result Date: 09/15/2019 CLINICAL DATA:  Pain. The patient was found down. EXAM: PORTABLE PELVIS 1-2 VIEWS COMPARISON:  None. FINDINGS: There is no evidence of  pelvic fracture or diastasis. No pelvic bone lesions are seen. Degenerative changes in the lower lumbar spine. IMPRESSION: Normal appearing pelvis. Degenerative changes in the lower lumbar spine. Electronically Signed   By: Lorriane Shire M.D.   On: 09/15/2019 14:34   DG Chest Port 1 View  Result Date: 09/16/2019 CLINICAL DATA:  74 year old female intubated after found down.  EXAM: PORTABLE CHEST 1 VIEW COMPARISON:  09/15/2019 portable chest. FINDINGS: Portable AP upright view at 0322 hours. Endotracheal tube tip in good position between the level the clavicles and carina. Enteric tube courses to the abdomen. Mildly lower lung volumes. Streaky left lung base opacity is new and most resembles atelectasis. Elsewhere lungs remain clear allowing for portable technique. Normal cardiac size and mediastinal contours. No acute osseous abnormality identified. IMPRESSION: 1.  Stable lines and tubes. 2. Left lung base atelectasis. No other acute cardiopulmonary abnormality. Electronically Signed   By: Genevie Ann M.D.   On: 09/16/2019 03:52   DG Chest Portable 1 View  Result Date: 09/15/2019 CLINICAL DATA:  Altered level of consciousness. Patient was found down. EXAM: PORTABLE CHEST 1 VIEW COMPARISON:  04/08/2006 FINDINGS: Endotracheal tube in good position 3 cm above the carina. NG tube tip below the diaphragm. Heart size and pulmonary vascularity are normal. Lungs are clear. No bone abnormality. IMPRESSION: Endotracheal tube in good position. Lungs are clear. Electronically Signed   By: Lorriane Shire M.D.   On: 09/15/2019 14:33   EEG adult  Result Date: 09/15/2019 Lora Havens, MD     09/15/2019  8:21 PM Patient Name: Sherry Kent MRN: [id number] Epilepsy Attending: Lora Havens Referring Physician/Provider: Dr Amie [place] Date: 09/15/2019 Duration: 21.23 mins Patient history: 74yo F with ams. EEG to evaluate for seizure Level of alertness: comatose AEDs during EEG study: None Technical aspects: This EEG  study was done with scalp electrodes positioned according to the 10-20 International system of electrode placement. Electrical activity was acquired at a sampling rate of 500Hz  and reviewed with a high frequency filter of 70Hz  and a low frequency filter of 1Hz . EEG data were recorded continuously and digitally stored. DESCRIPTION: EEG showed continuous generalized background attenuation. EEG was not reactive to tactile stimulation. Hyperventilation and photic stimulation were not performed. ABNORMALITY - Background attenuation, generalized IMPRESSION: This study is suggestive of profound diffuse encephalopathy, non specific to etiology.  No seizures or epileptiform discharges were seen throughout the recording. Lora Havens   ECHOCARDIOGRAM COMPLETE  Result Date: 09/16/2019   ECHOCARDIOGRAM REPORT   Patient Name:   Sherry Kent Date of Exam: 09/16/2019 Medical Rec #:  [id number]      Height:       66.0 in Accession #:    [id number]     Weight:       174.2 lb Date of Birth:  02/16/1946       BSA:          1.89 m Patient Age:    67 years       BP:           127/63 mmHg Patient Gender: F              HR:           83 bpm. Exam Location:  Inpatient Procedure: 2D Echo Indications:    Syncope 780.2 / R55  History:        Patient has prior history of Echocardiogram examinations, most                 recent 11/03/2014. TIA; Risk Factors:Hypertension and                 Dyslipidemia.  Sonographer:    Vikki Ports Turrentine Referring Phys: Section  1. Suboptimal image quality to rule out an intracardiac source of emboli. No large or definite valvular lesions  noted. Consider TEE if clinically indicated for source of stroke.  2. The interatrial septum was not well visualized.  3. Left ventricular ejection fraction, by visual estimation, is 70 to 75%. The left ventricle has hyperdynamic function. There is no left ventricular hypertrophy.  4. Left ventricular diastolic parameters are consistent with Grade  I diastolic dysfunction (impaired relaxation).  5. Global right ventricle has normal systolic function.The right ventricular size is normal. No increase in right ventricular wall thickness.  6. Left atrial size was normal.  7. Right atrial size was normal.  8. The mitral valve is grossly normal. No evidence of mitral valve regurgitation. No evidence of mitral stenosis.  9. The tricuspid valve is not well visualized. 10. The aortic valve is grossly normal. Aortic valve regurgitation is not visualized. Mild aortic valve sclerosis without stenosis. 11. The pulmonic valve was not well visualized. Pulmonic valve regurgitation is trivial. 12. The inferior vena cava is normal in size with greater than 50% respiratory variability, suggesting right atrial pressure of 3 mmHg. 13. The ascending aorta was not well visualized and the aortic arch was not well visualized. FINDINGS  Left Ventricle: Left ventricular ejection fraction, by visual estimation, is 70 to 75%. The left ventricle has hyperdynamic function. The left ventricle is not well visualized. There is no left ventricular hypertrophy. Left ventricular diastolic parameters are consistent with Grade I diastolic dysfunction (impaired relaxation). Right Ventricle: The right ventricular size is normal. No increase in right ventricular wall thickness. Global RV systolic function is has normal systolic function. Left Atrium: Left atrial size was normal in size. Right Atrium: Right atrial size was normal in size Pericardium: There is no evidence of pericardial effusion. Mitral Valve: The mitral valve is grossly normal. No evidence of mitral valve regurgitation. No evidence of mitral valve stenosis by observation. Tricuspid Valve: The tricuspid valve is not well visualized. Tricuspid valve regurgitation is trivial. Aortic Valve: The aortic valve is grossly normal. Aortic valve regurgitation is not visualized. Mild aortic valve sclerosis is present, with no evidence of aortic  valve stenosis. Pulmonic Valve: The pulmonic valve was not well visualized. Pulmonic valve regurgitation is trivial. Pulmonic regurgitation is trivial. Aorta: The aortic root is normal in size and structure, the ascending aorta was not well visualized and the aortic arch was not well visualized. Venous: The inferior vena cava is normal in size with greater than 50% respiratory variability, suggesting right atrial pressure of 3 mmHg. IAS/Shunts: The interatrial septum was not well visualized.  LEFT VENTRICLE PLAX 2D LVIDd:         3.70 cm  Diastology LVIDs:         2.30 cm  LV e' lateral:   6.42 cm/s LV PW:         1.00 cm  LV E/e' lateral: 12.4 LV IVS:        1.00 cm  LV e' medial:    7.07 cm/s LVOT diam:     1.80 cm  LV E/e' medial:  11.2 LV SV:         40 ml LV SV Index:   20.65 LVOT Area:     2.54 cm  RIGHT VENTRICLE RV S prime:     16.90 cm/s TAPSE (M-mode): 2.4 cm LEFT ATRIUM             Index LA diam:        4.50 cm 2.39 cm/m LA Vol (A2C):   57.9 ml 30.71 ml/m LA Vol (A4C):   63.2  ml 33.52 ml/m LA Biplane Vol: 60.6 ml 32.14 ml/m  AORTIC VALVE LVOT Vmax:   154.00 cm/s LVOT Vmean:  104.000 cm/s LVOT VTI:    0.315 m  AORTA Ao Root diam: 2.80 cm MV E velocity: 79.30 cm/s 103 cm/s                                    SHUNTS                                    Systemic VTI:  0.32 m                                    Systemic Diam: 1.80 cm  Cherlynn Kaiser MD Electronically signed by Cherlynn Kaiser MD Signature Date/Time: 09/16/2019/2:00:04 PM    Final     Labs: BNP (last 3 results) No results for input(s): BNP in the last 8760 hours. Basic Metabolic Panel: Recent Labs  Lab 09/16/19 0311 09/16/19 0339 09/16/19 1830 09/17/19 0219 09/18/19 0509 09/18/19 0509 09/19/19 0810 09/19/19 1437 09/20/19 0708 09/20/19 1426 09/21/19 0451  NA 141   < > 140   < > 139  --  139 139 138  --  141  K 3.9   < > 3.8   < > 3.9   < > 2.5* 3.3* 2.7* 3.2* 3.3*  CL 111   < > 110   < > 104  --  99 98 96*  --  103  CO2  17*   < > 19*   < > 23  --  25 28 26   --  27  GLUCOSE 116*   < > 110*   < > 94  --  90 100* 89  --  93  BUN 23   < > 19   < > 14  --  11 11 9   --  11  CREATININE 1.66*   < > 1.51*   < > 1.33*  --  1.10* 1.03* 1.11*  --  1.16*  CALCIUM 8.0*   < > 8.1*   < > 8.9  --  9.1 8.8* 8.7*  --  8.9  MG 1.7  --  2.4  --   --   --   --  1.8  --   --   --   PHOS 3.8  --  3.4  --   --   --   --   --  3.0  --   --    < > = values in this interval not displayed.   Liver Function Tests: Recent Labs  Lab 09/17/19 0219 09/18/19 0509 09/19/19 0810 09/20/19 0708 09/21/19 0451  AST 39 38 36 26 34  ALT 24 28 33 27 31  ALKPHOS 59 60 62 59 63  BILITOT 0.7 1.1 0.7 1.0 0.6  PROT 5.8* 6.1* 7.1 6.5 6.4*  ALBUMIN 3.0* 3.0* 3.4* 3.2* 3.4*   No results for input(s): LIPASE, AMYLASE in the last 168 hours. No results for input(s): AMMONIA in the last 168 hours. CBC: Recent Labs  Lab 09/17/19 0219 09/18/19 0509 09/19/19 0810 09/20/19 0708 09/21/19 0451  WBC 14.7* 10.2 10.8* 9.4 8.2  NEUTROABS 11.7* 7.9* 8.0* 6.4 4.8  HGB 10.7* 10.8* 12.5 12.1 12.9  HCT 33.4* 34.3* 40.0 37.2 40.7  MCV 83.9 84.9 83.7 82.3 83.9  PLT 242 249 289 290 298   Cardiac Enzymes: Recent Labs  Lab 09/15/19 1732 09/16/19 1141 09/16/19 1830 09/20/19 0708  CKTOTAL 920* 2,241* 1,915* 536*   BNP: Invalid input(s): POCBNP CBG: Recent Labs  Lab 09/20/19 1605 09/20/19 1955 09/20/19 2326 09/21/19 0428 09/21/19 0712  GLUCAP 106* 82 99 93 87   D-Dimer No results for input(s): DDIMER in the last 72 hours. Hgb A1c No results for input(s): HGBA1C in the last 72 hours. Lipid Profile No results for input(s): CHOL, HDL, LDLCALC, TRIG, CHOLHDL, LDLDIRECT in the last 72 hours. Thyroid function studies No results for input(s): TSH, T4TOTAL, T3FREE, THYROIDAB in the last 72 hours.  Invalid input(s): FREET3 Anemia work up No results for input(s): VITAMINB12, FOLATE, FERRITIN, TIBC, IRON, RETICCTPCT in the last 72  hours. Urinalysis    Component Value Date/Time   COLORURINE AMBER (A) 09/15/2019 1450   APPEARANCEUR CLOUDY (A) 09/15/2019 1450   LABSPEC >1.046 (H) 09/15/2019 1450   PHURINE 5.0 09/15/2019 1450   GLUCOSEU >=500 (A) 09/15/2019 1450   HGBUR SMALL (A) 09/15/2019 1450   BILIRUBINUR NEGATIVE 09/15/2019 1450   KETONESUR NEGATIVE 09/15/2019 1450   PROTEINUR >=300 (A) 09/15/2019 1450   UROBILINOGEN 0.2 12/11/2011 1102   NITRITE NEGATIVE 09/15/2019 1450   LEUKOCYTESUR NEGATIVE 09/15/2019 1450   Sepsis Labs Invalid input(s): PROCALCITONIN,  WBC,  LACTICIDVEN Microbiology Recent Results (from the past 240 hour(s))  Respiratory Panel by RT PCR (Flu A&B, Covid) - Nasopharyngeal Swab     Status: None   Collection Time: 09/15/19  1:22 PM   Specimen: Nasopharyngeal Swab  Result Value Ref Range Status   SARS Coronavirus 2 by RT PCR NEGATIVE NEGATIVE Final    Comment: (NOTE) SARS-CoV-2 target nucleic acids are NOT DETECTED. The SARS-CoV-2 RNA is generally detectable in upper respiratoy specimens during the acute phase of infection. The lowest concentration of SARS-CoV-2 viral copies this assay can detect is 131 copies/mL. A negative result does not preclude SARS-Cov-2 infection and should not be used as the sole basis for treatment or other patient management decisions. A negative result may occur with  improper specimen collection/handling, submission of specimen other than nasopharyngeal swab, presence of viral mutation(s) within the areas targeted by this assay, and inadequate number of viral copies (<131 copies/mL). A negative result must be combined with clinical observations, patient history, and epidemiological information. The expected result is Negative. Fact Sheet for Patients:  PinkCheek.be Fact Sheet for Healthcare Providers:  GravelBags.it This test is not yet ap proved or cleared by the [place] FDA and  has been  authorized for detection and/or diagnosis of SARS-CoV-2 by FDA under an Emergency Use Authorization (EUA). This EUA will remain  in effect (meaning this test can be used) for the duration of the COVID-19 declaration under Section 564(b)(1) of the Act, 21 U.S.C. section 360bbb-3(b)(1), unless the authorization is terminated or revoked sooner.    Influenza A by PCR NEGATIVE NEGATIVE Final   Influenza B by PCR NEGATIVE NEGATIVE Final    Comment: (NOTE) The Xpert Xpress SARS-CoV-2/FLU/RSV assay is intended as an aid in  the diagnosis of influenza from Nasopharyngeal swab specimens and  should not be used as a sole basis for treatment. Nasal washings and  aspirates are unacceptable for Xpert Xpress SARS-CoV-2/FLU/RSV  testing. Fact Sheet for Patients: PinkCheek.be Fact Sheet for Healthcare Providers: GravelBags.it This test is not yet approved or cleared by the [place]  States FDA and  has been authorized for detection and/or diagnosis of SARS-CoV-2 by  FDA under an Emergency Use Authorization (EUA). This EUA will remain  in effect (meaning this test can be used) for the duration of the  Covid-19 declaration under Section 564(b)(1) of the Act, 21  U.S.C. section 360bbb-3(b)(1), unless the authorization is  terminated or revoked. Performed at [facility], [place] [address] [place], [place] [address]   Urine culture     Status: None   Collection Time: 09/15/19  2:50 PM   Specimen: Urine, Random  Result Value Ref Range Status   Specimen Description URINE, RANDOM  Final   Special Requests NONE  Final   Culture   Final    NO GROWTH Performed at [facility], [place] [address] [place], [place] [address]    Report Status 09/16/2019 FINAL  Final  Blood culture (routine x 2)     Status: None   Collection Time: 09/15/19  4:05 PM   Specimen: BLOOD LEFT WRIST  Result Value Ref Range Status   Specimen Description BLOOD LEFT  WRIST  Final   Special Requests   Final    BOTTLES DRAWN AEROBIC AND ANAEROBIC Blood Culture results may not be optimal due to an inadequate volume of blood received in culture bottles   Culture   Final    NO GROWTH 5 DAYS Performed at [facility], [place] [address], [place], [place] [address]    Report Status 09/20/2019 FINAL  Final  Blood culture (routine x 2)     Status: None   Collection Time: 09/15/19  4:40 PM   Specimen: BLOOD  Result Value Ref Range Status   Specimen Description BLOOD RIGHT ANTECUBITAL  Final   Special Requests   Final    BOTTLES DRAWN AEROBIC AND ANAEROBIC Blood Culture adequate volume   Culture   Final    NO GROWTH 5 DAYS Performed at [facility], [place] [address], [place], [place] [address]    Report Status 09/20/2019 FINAL  Final  CSF culture     Status: None   Collection Time: 09/15/19  6:20 PM   Specimen: CSF; Cerebrospinal Fluid  Result Value Ref Range Status   Specimen Description CSF  Final   Special Requests NONE  Final   Gram Stain   Final    WBC PRESENT,BOTH PMN AND MONONUCLEAR NO ORGANISMS SEEN CYTOSPIN SMEAR    Culture   Final    NO GROWTH 3 DAYS Performed at [facility], [place] [address] [place], [place] [address]    Report Status 09/19/2019 FINAL  Final  MRSA PCR Screening     Status: None   Collection Time: 09/15/19  9:02 PM   Specimen: Nasal Mucosa; Nasopharyngeal  Result Value Ref Range Status   MRSA by PCR NEGATIVE NEGATIVE Final    Comment:        The GeneXpert MRSA Assay (FDA approved for NASAL specimens only), is one component of a comprehensive MRSA colonization surveillance program. It is not intended to diagnose MRSA infection nor to guide or monitor treatment for MRSA infections. Performed at [facility], [place] [address] [place], [place] [address]      Time coordinating discharge: 25  minutes  SIGNED: Antonieta Pert, MD  [facility] 09/21/2019, 10:53 AM  If 7PM-7AM, please contact  night-coverage [email or web address]

## 2019-09-29 ENCOUNTER — Ambulatory Visit (INDEPENDENT_AMBULATORY_CARE_PROVIDER_SITE_OTHER): Payer: Medicare PPO | Admitting: Family Medicine

## 2019-09-29 ENCOUNTER — Other Ambulatory Visit: Payer: Self-pay

## 2019-09-29 ENCOUNTER — Encounter: Payer: Self-pay | Admitting: Family Medicine

## 2019-09-29 VITALS — BP 126/70 | HR 82 | Temp 98.7°F | Resp 14 | Ht 66.0 in | Wt 164.0 lb

## 2019-09-29 DIAGNOSIS — Z87440 Personal history of urinary (tract) infections: Secondary | ICD-10-CM

## 2019-09-29 DIAGNOSIS — Z23 Encounter for immunization: Secondary | ICD-10-CM

## 2019-09-29 DIAGNOSIS — I10 Essential (primary) hypertension: Secondary | ICD-10-CM

## 2019-09-29 DIAGNOSIS — N390 Urinary tract infection, site not specified: Secondary | ICD-10-CM | POA: Diagnosis not present

## 2019-09-29 DIAGNOSIS — N179 Acute kidney failure, unspecified: Secondary | ICD-10-CM | POA: Diagnosis not present

## 2019-09-29 LAB — URINALYSIS, ROUTINE W REFLEX MICROSCOPIC
Bilirubin Urine: NEGATIVE
Glucose, UA: NEGATIVE
Leukocytes,Ua: NEGATIVE
Nitrite: NEGATIVE
Specific Gravity, Urine: 1.02 (ref 1.001–1.03)
pH: 5.5 (ref 5.0–8.0)

## 2019-09-29 LAB — MICROSCOPIC MESSAGE

## 2019-09-29 NOTE — Patient Instructions (Signed)
F/U 1 month

## 2019-09-29 NOTE — Assessment & Plan Note (Signed)
She is nearing her baseline with the exception of the weight loss that occurred during her illness.  This is not uncommon at her age.  Her appetite is improving.  We will have her continue the amlodipine as well as the bisoprolol for her blood pressure.  Hold off on the ACE inhibitor and the second of the acute on chronic renal failure.  Recheck her metabolic panel today as well for the potassium she is she did have hypokalemia that needs to be treated.  Her repeat urinalysis still shows some blood but no leukocytes and she had mild protein.  I will send this for repeat culture.  She is not having any current urinary symptoms.  Influenza vaccine was also given during the visit.

## 2019-09-29 NOTE — Progress Notes (Signed)
Subjective:    Patient ID: Sherry Kent, female    DOB: 1945-09-13, 74 y.o.   MRN: [id number]  Patient presents for Hospital F/U (sepsis D/T cystitis?)  Here for hospital follow-up.  She is found to be encephalopathic within her home.  She was admitted to the ICU.  She was found to have urinary tract infection.  She did have urinary drug screen this was positive for marijuana but otherwise negative.  She had EEG done did not show any seizure activity just encephalopathy.  CT of head C-spine chest x-ray were all negative. Treat for urinary tract infection with Rocephin then transition to oral Keflex at discharge  Hypertension initially her blood pressure medicine was held, she is on amlodipine as well as bisoprolol 10 mg currently.  Her lisinopril held secondary to due to acute kidney injury.  Her Bystolic was actually discontinued in favor of the bisoprolol.  She also had hypokalemia which was replaced during the admission.    Today she feels much better with the exception of some fatigue.  Her appetite is slowly improving.  Her niece has been staying with her at nighttime but she is able to get around during the day without any difficulty.  She states that she really does not recall anything that happened during her illness.  She does not recall feeling sick before she collapsed.  She did have cardiology evaluation as well which came back unremarkable.  Today she needs a repeat CBC and metabolic panel  Reviewed hospital discharge reviewed with patient in detail Review Of Systems:  GEN- denies fatigue, fever, weight loss,weakness, recent illness HEENT- denies eye drainage, change in vision, nasal discharge, CVS- denies chest pain, palpitations RESP- denies SOB, cough, wheeze ABD- denies N/V, change in stools, abd pain GU- denies dysuria, hematuria, dribbling, incontinence MSK- denies joint pain, muscle aches, injury Neuro- denies headache, dizziness, syncope, seizure activity        Objective:    BP 126/70   Pulse 82   Temp 98.7 F (37.1 C) (Temporal)   Resp 14   Ht 5\' 6"  (1.676 m)   Wt 164 lb (74.4 kg)   SpO2 98%   BMI 26.47 kg/m  GEN- NAD, alert and oriented x3, well-appearing HEENT- PERRL, EOMI, non injected sclera, pink conjunctiva, MMM, oropharynx clear Neck- Supple, no thyromegaly CVS- RRR, no murmur RESP-CTAB ABD-NABS,soft,NT,ND EXT- No edema Pulses- Radial, DP- 2+        Assessment & Plan:      Problem List Items Addressed This Visit      Unprioritized   Hypertension    She is nearing her baseline with the exception of the weight loss that occurred during her illness.  This is not uncommon at her age.  Her appetite is improving.  We will have her continue the amlodipine as well as the bisoprolol for her blood pressure.  Hold off on the ACE inhibitor and the second of the acute on chronic renal failure.  Recheck her metabolic panel today as well for the potassium she is she did have hypokalemia that needs to be treated.  Her repeat urinalysis still shows some blood but no leukocytes and she had mild protein.  I will send this for repeat culture.  She is not having any current urinary symptoms.  Influenza vaccine was also given during the visit.       Other Visit Diagnoses    Need for immunization against influenza    -  Primary   Relevant  Orders   Flu Vaccine QUAD High Dose(Fluad) (Completed)   Hx of acute cystitis       Relevant Orders   Urinalysis, Routine w reflex microscopic (Completed)   Urine Culture   Acute renal failure, unspecified acute renal failure type ([place])       Relevant Orders   CBC with Differential/Platelet   Basic metabolic panel   Urine Culture      Note: This dictation was prepared with Dragon dictation along with smaller phrase technology. Any transcriptional errors that result from this process are unintentional.

## 2019-09-30 ENCOUNTER — Telehealth: Payer: Self-pay | Admitting: *Deleted

## 2019-09-30 LAB — BASIC METABOLIC PANEL
BUN/Creatinine Ratio: 11 (calc) (ref 6–22)
BUN: 18 mg/dL (ref 7–25)
CO2: 23 mmol/L (ref 20–32)
Calcium: 9.7 mg/dL (ref 8.6–10.4)
Chloride: 104 mmol/L (ref 98–110)
Creat: 1.66 mg/dL — ABNORMAL HIGH (ref 0.60–0.93)
Glucose, Bld: 99 mg/dL (ref 65–99)
Potassium: 5.9 mmol/L — ABNORMAL HIGH (ref 3.5–5.3)
Sodium: 140 mmol/L (ref 135–146)

## 2019-09-30 LAB — CBC WITH DIFFERENTIAL/PLATELET
Absolute Monocytes: 868 cells/uL (ref 200–950)
Basophils Absolute: 42 cells/uL (ref 0–200)
Basophils Relative: 0.6 %
Eosinophils Absolute: 126 cells/uL (ref 15–500)
Eosinophils Relative: 1.8 %
HCT: 39.9 % (ref 35.0–45.0)
Hemoglobin: 13 g/dL (ref 11.7–15.5)
Lymphs Abs: 3388 cells/uL (ref 850–3900)
MCH: 27 pg (ref 27.0–33.0)
MCHC: 32.6 g/dL (ref 32.0–36.0)
MCV: 83 fL (ref 80.0–100.0)
MPV: 10.5 fL (ref 7.5–12.5)
Monocytes Relative: 12.4 %
Neutro Abs: 2576 cells/uL (ref 1500–7800)
Neutrophils Relative %: 36.8 %
Platelets: 367 10*3/uL (ref 140–400)
RBC: 4.81 10*6/uL (ref 3.80–5.10)
RDW: 16.5 % — ABNORMAL HIGH (ref 11.0–15.0)
Total Lymphocyte: 48.4 %
WBC: 7 10*3/uL (ref 3.8–10.8)

## 2019-09-30 LAB — URINE CULTURE
MICRO NUMBER:: 10087169
Result:: NO GROWTH
SPECIMEN QUALITY:: ADEQUATE

## 2019-09-30 NOTE — Telephone Encounter (Signed)
Received call from patient.   Reports that her niece has been coming to her house to stay with her at night since she returned home from hospital. States that niece was tested for COVID at the end of last week. Results returned positive. Advised to isolate away from positive persons. Advised to continue to use masks and good hand hygiene.   Advised if Sx present, return call for recommendations at that time.

## 2019-10-01 ENCOUNTER — Other Ambulatory Visit: Payer: Self-pay | Admitting: *Deleted

## 2019-10-01 DIAGNOSIS — E875 Hyperkalemia: Secondary | ICD-10-CM

## 2019-10-01 MED ORDER — SODIUM POLYSTYRENE SULFONATE PO POWD: ORAL | 0 refills | Status: DC

## 2019-10-01 NOTE — Telephone Encounter (Signed)
noted 

## 2019-11-01 ENCOUNTER — Encounter: Payer: Self-pay | Admitting: Family Medicine

## 2019-11-01 ENCOUNTER — Ambulatory Visit (INDEPENDENT_AMBULATORY_CARE_PROVIDER_SITE_OTHER): Payer: Medicare PPO | Admitting: Family Medicine

## 2019-11-01 ENCOUNTER — Other Ambulatory Visit: Payer: Self-pay

## 2019-11-01 VITALS — BP 130/68 | HR 90 | Temp 98.1°F | Resp 16 | Ht 66.0 in | Wt 168.0 lb

## 2019-11-01 DIAGNOSIS — I10 Essential (primary) hypertension: Secondary | ICD-10-CM

## 2019-11-01 DIAGNOSIS — N179 Acute kidney failure, unspecified: Secondary | ICD-10-CM

## 2019-11-01 DIAGNOSIS — E875 Hyperkalemia: Secondary | ICD-10-CM

## 2019-11-01 DIAGNOSIS — N1831 Chronic kidney disease, stage 3a: Secondary | ICD-10-CM | POA: Diagnosis not present

## 2019-11-01 LAB — COMPLETE METABOLIC PANEL WITH GFR
AG Ratio: 1.6 (calc) (ref 1.0–2.5)
ALT: 17 U/L (ref 6–29)
AST: 20 U/L (ref 10–35)
Albumin: 4.4 g/dL (ref 3.6–5.1)
Alkaline phosphatase (APISO): 82 U/L (ref 37–153)
BUN/Creatinine Ratio: 20 (calc) (ref 6–22)
BUN: 26 mg/dL — ABNORMAL HIGH (ref 7–25)
CO2: 24 mmol/L (ref 20–32)
Calcium: 10.1 mg/dL (ref 8.6–10.4)
Chloride: 104 mmol/L (ref 98–110)
Creat: 1.32 mg/dL — ABNORMAL HIGH (ref 0.60–0.93)
GFR, Est African American: 46 mL/min/{1.73_m2} — ABNORMAL LOW (ref >=60)
GFR, Est Non African American: 40 mL/min/{1.73_m2} — ABNORMAL LOW (ref >=60)
Globulin: 2.8 g/dL (calc) (ref 1.9–3.7)
Glucose, Bld: 92 mg/dL (ref 65–99)
Potassium: 5 mmol/L (ref 3.5–5.3)
Sodium: 139 mmol/L (ref 135–146)
Total Bilirubin: 0.5 mg/dL (ref 0.2–1.2)
Total Protein: 7.2 g/dL (ref 6.1–8.1)

## 2019-11-01 MED ORDER — OMEPRAZOLE 20 MG PO CPDR: 20.0000 mg | DELAYED_RELEASE_CAPSULE | Freq: Every day | ORAL | 2 refills | Status: DC

## 2019-11-01 MED ORDER — AMLODIPINE BESYLATE 10 MG PO TABS: 10.0000 mg | ORAL_TABLET | Freq: Every day | ORAL | 3 refills | Status: DC

## 2019-11-01 MED ORDER — ROSUVASTATIN CALCIUM 20 MG PO TABS: ORAL_TABLET | ORAL | 1 refills | Status: DC

## 2019-11-01 MED ORDER — BISOPROLOL FUMARATE 10 MG PO TABS: 10.0000 mg | ORAL_TABLET | Freq: Every day | ORAL | 2 refills | Status: DC

## 2019-11-01 MED ORDER — SERTRALINE HCL 50 MG PO TABS: 50.0000 mg | ORAL_TABLET | Freq: Every day | ORAL | 2 refills | Status: DC

## 2019-11-01 NOTE — Assessment & Plan Note (Signed)
Blood pressures controlled on current meds no changes.  We will recheck her renal function as well as her potassium.  Her appetite is back to baseline she is up a few pounds and her weight which is at her baseline as well.

## 2019-11-01 NOTE — Patient Instructions (Signed)
Stop the Meloxicam Take tylenol for aches and pains We will call with lab results Continue all other medications F/U 4 for Physical

## 2019-11-01 NOTE — Progress Notes (Signed)
   Subjective:    Patient ID: Sherry Kent, female    DOB: 19-Apr-1946, 74 y.o.   MRN: [id number]  Patient presents for Follow-up (is not fasting)  Patient here for interim follow-up her last visit was 1 month ago.  She had been admitted to the hospital secondary to altered mental status found to have urinary tract infection.  She had some weight loss also during her illness.  She had acute on chronic renal failure therefore ACE inhibitor was held.  She was continued on bisoprolol and amlodipine for her hypertension.  She is also noted to have Hyperkalemia and was given a dose of Kayexalate , potassium was 5.9. I believe her potassium was overshot after having hypokalemia while hospitalized She is here to follow-up blood pressure and recheck labs.  She feels good she has no particular concerns.  Medications reviewed refills are needed  Acute renal failure- last Cr 1.66 , baseline typically  1-1.2  Weight 1/27 was 164 , today 168   Review Of Systems:  GEN- denies fatigue, fever, weight loss,weakness, recent illness HEENT- denies eye drainage, change in vision, nasal discharge, CVS- denies chest pain, palpitations RESP- denies SOB, cough, wheeze ABD- denies N/V, change in stools, abd pain GU- denies dysuria, hematuria, dribbling, incontinence MSK- denies joint pain, muscle aches, injury Neuro- denies headache, dizziness, syncope, seizure activity       Objective:    BP 130/68   Pulse 90   Temp 98.1 F (36.7 C) (Temporal)   Resp 16   Ht 5\' 6"  (1.676 m)   Wt 168 lb (76.2 kg)   SpO2 96%   BMI 27.12 kg/m  GEN- NAD, alert and oriented x3 HEENT- PERRL, EOMI, non injected sclera, pink conjunctiva, MMM, oropharynx clear Neck- Supple, no thyromegaly CVS- RRR, no murmur RESP-CTAB ABD-NABS,soft,NT,ND EXT- No edema Pulses- Radial, DP- 2+        Assessment & Plan:      Problem List Items Addressed This Visit      Unprioritized   Hypertension    Blood pressures controlled  on current meds no changes.  We will recheck her renal function as well as her potassium.  Her appetite is back to baseline she is up a few pounds and her weight which is at her baseline as well.      Relevant Medications   rosuvastatin (CRESTOR) 20 MG tablet   bisoprolol (ZEBETA) 10 MG tablet   amLODipine (NORVASC) 10 MG tablet    Other Visit Diagnoses    Hyperkalemia    -  Primary   Relevant Orders   COMPLETE METABOLIC PANEL WITH GFR   Acute renal failure superimposed on stage 3a chronic kidney disease, unspecified acute renal failure type ([place])       Advised to discontinue meloxicam.  Use acetaminophen in place   Relevant Orders   COMPLETE METABOLIC PANEL WITH GFR      Note: This dictation was prepared with Dragon dictation along with smaller phrase technology. Any transcriptional errors that result from this process are unintentional.

## 2019-11-03 ENCOUNTER — Encounter: Payer: Self-pay | Admitting: *Deleted

## 2019-12-06 ENCOUNTER — Other Ambulatory Visit: Payer: Self-pay | Admitting: Family Medicine

## 2019-12-24 ENCOUNTER — Ambulatory Visit: Payer: Medicare PPO | Admitting: Family Medicine

## 2019-12-24 ENCOUNTER — Other Ambulatory Visit: Payer: Self-pay

## 2019-12-24 ENCOUNTER — Encounter: Payer: Self-pay | Admitting: Family Medicine

## 2019-12-24 VITALS — BP 144/82 | HR 68 | Temp 98.2°F | Resp 14 | Ht 66.0 in | Wt 176.0 lb

## 2019-12-24 DIAGNOSIS — N1831 Chronic kidney disease, stage 3a: Secondary | ICD-10-CM

## 2019-12-24 DIAGNOSIS — R410 Disorientation, unspecified: Secondary | ICD-10-CM

## 2019-12-24 DIAGNOSIS — R300 Dysuria: Secondary | ICD-10-CM

## 2019-12-24 DIAGNOSIS — I10 Essential (primary) hypertension: Secondary | ICD-10-CM | POA: Diagnosis not present

## 2019-12-24 LAB — COMPREHENSIVE METABOLIC PANEL
AG Ratio: 1.5 (calc) (ref 1.0–2.5)
ALT: 13 U/L (ref 6–29)
AST: 13 U/L (ref 10–35)
Albumin: 4.7 g/dL (ref 3.6–5.1)
Alkaline phosphatase (APISO): 82 U/L (ref 37–153)
BUN/Creatinine Ratio: 21 (calc) (ref 6–22)
BUN: 28 mg/dL — ABNORMAL HIGH (ref 7–25)
CO2: 24 mmol/L (ref 20–32)
Calcium: 9.8 mg/dL (ref 8.6–10.4)
Chloride: 103 mmol/L (ref 98–110)
Creat: 1.35 mg/dL — ABNORMAL HIGH (ref 0.60–0.93)
Globulin: 3.1 g/dL (calc) (ref 1.9–3.7)
Glucose, Bld: 136 mg/dL — ABNORMAL HIGH (ref 65–99)
Potassium: 4.4 mmol/L (ref 3.5–5.3)
Sodium: 139 mmol/L (ref 135–146)
Total Bilirubin: 0.6 mg/dL (ref 0.2–1.2)
Total Protein: 7.8 g/dL (ref 6.1–8.1)

## 2019-12-24 LAB — CBC WITH DIFFERENTIAL/PLATELET
Absolute Monocytes: 730 cells/uL (ref 200–950)
Basophils Absolute: 51 cells/uL (ref 0–200)
Basophils Relative: 0.4 %
Eosinophils Absolute: 64 cells/uL (ref 15–500)
Eosinophils Relative: 0.5 %
HCT: 40.5 % (ref 35.0–45.0)
Hemoglobin: 13.2 g/dL (ref 11.7–15.5)
Lymphs Abs: 1869 cells/uL (ref 850–3900)
MCH: 26.5 pg — ABNORMAL LOW (ref 27.0–33.0)
MCHC: 32.6 g/dL (ref 32.0–36.0)
MCV: 81.2 fL (ref 80.0–100.0)
MPV: 9.6 fL (ref 7.5–12.5)
Monocytes Relative: 5.7 %
Neutro Abs: 10086 cells/uL — ABNORMAL HIGH (ref 1500–7800)
Neutrophils Relative %: 78.8 %
Platelets: 288 10*3/uL (ref 140–400)
RBC: 4.99 10*6/uL (ref 3.80–5.10)
RDW: 15.7 % — ABNORMAL HIGH (ref 11.0–15.0)
Total Lymphocyte: 14.6 %
WBC: 12.8 10*3/uL — ABNORMAL HIGH (ref 3.8–10.8)

## 2019-12-24 LAB — URINALYSIS, ROUTINE W REFLEX MICROSCOPIC
Bilirubin Urine: NEGATIVE
Glucose, UA: NEGATIVE
Ketones, ur: NEGATIVE
Leukocytes,Ua: NEGATIVE
Nitrite: NEGATIVE
Specific Gravity, Urine: 1.025 (ref 1.001–1.03)
pH: 5 (ref 5.0–8.0)

## 2019-12-24 LAB — MICROSCOPIC MESSAGE

## 2019-12-24 MED ORDER — CEPHALEXIN 250 MG PO CAPS
250.0000 mg | ORAL_CAPSULE | Freq: Three times a day (TID) | ORAL | 0 refills | Status: DC
Start: 2019-12-24 — End: 2020-01-24

## 2019-12-24 NOTE — Patient Instructions (Signed)
Take antibiotics F/U pending results

## 2019-12-24 NOTE — Assessment & Plan Note (Signed)
Pt to take meds once she gets home  Confusion symptoms along with a UTI symptoms are concerning for infection.  Overall neurologically intact though she does have history of TIAs that she could have had one on Wednesday.  At this time there are no residual effects but she still has the UTI symptoms so we will treat that first.  Stat labs will also be done.  She does get recurrence of severe symptoms change in speech again she should go to the nearest hospital

## 2019-12-24 NOTE — Assessment & Plan Note (Signed)
Pt has chronic microscopic hematuria

## 2019-12-24 NOTE — Progress Notes (Signed)
Subjective:    Patient ID: Sherry Kent, female    DOB: 1946-01-09, 74 y.o.   MRN: [id number]  Patient presents for Dysuria (x weeks- urinary frequency, increased confusion) and Dizziness (x weeks- states that she intermittently feels "wobbily")  Pt here with dysuria for the past week, she has urinary frequency and urgency, and general felling on unwell. Odor to urine and dark color to urine.  She was at work Wed talking to someone and could tell she wasn't talking right but she couldn't stop it, she was confused as well. The spell lasted a few minutes then resolved. The person did get another co worker to come check on her, however she was back to baseline at that time. She decided to go home and was able to drive but then didn't remember the drive home, she also had low grade temp that night   She denied any other stroke symptoms such as weakness, facial droop or speech slurring Wed and Thursday, She has felt a little wobbily , but no true dizziness or vertigo spells. She had mild HA thursday, she had eaten anything but after she ate she was fine   No chest pain, no SOB, no vomiting , no diarrhea    HTN- she has not taken BP meds this AM    Review Of Systems:  GEN- denies fatigue, fever, weight loss,+weakness, recent illness HEENT- denies eye drainage, change in vision, nasal discharge, CVS- denies chest pain, palpitations RESP- denies SOB, cough, wheeze ABD- denies N/V, change in stools, abd pain GU- + dysuria, denies  hematuria, dribbling, incontinence MSK- denies joint pain, muscle aches, injury Neuro- denies headache, dizziness, syncope, seizure activity       Objective:    BP (!) 144/82   Pulse 68   Temp 98.2 F (36.8 C) (Temporal)   Resp 14   Ht 5\' 6"  (1.676 m)   Wt 176 lb (79.8 kg)   SpO2 99%   BMI 28.41 kg/m  GEN- NAD, alert and oriented x3, non toxic appearing,  HEENT- PERRL, EOMI, non injected sclera, pink conjunctiva, MMM, oropharynx clear Neck- Supple,  no thyromegaly CVS- RRR, no murmur RESP-CTAB ABD-NABS,soft,NT,ND, no CVA tenderness  NEURO-CNII-XII int act no focal deficit  Psych- normal affect and mood  EXT- No edema Pulses- Radial,  2+        Assessment & Plan:      Problem List Items Addressed This Visit      Unprioritized   Chronic kidney disease    Pt has chronic microscopic hematuria      Hypertension    Pt to take meds once she gets home  Confusion symptoms along with a UTI symptoms are concerning for infection.  Overall neurologically intact though she does have history of TIAs that she could have had one on Wednesday.  At this time there are no residual effects but she still has the UTI symptoms so we will treat that first.  Stat labs will also be done.  She does get recurrence of severe symptoms change in speech again she should go to the nearest hospital      Relevant Medications   lisinopril (ZESTRIL) 40 MG tablet    Other Visit Diagnoses    Dysuria    -  Primary   Will send urine for culture, start keflex based on presentation in past resulting in encephaloapthy  and hospitalization   Relevant Orders   Urinalysis, Routine w reflex microscopic   CBC with Differential/Platelet  Urine Culture   Confusion       Relevant Orders   Comprehensive metabolic panel   CBC with Differential/Platelet      Note: This dictation was prepared with Dragon dictation along with smaller phrase technology. Any transcriptional errors that result from this process are unintentional.

## 2019-12-25 LAB — URINE CULTURE
MICRO NUMBER:: 10399466
SPECIMEN QUALITY:: ADEQUATE

## 2019-12-27 ENCOUNTER — Other Ambulatory Visit: Payer: Self-pay | Admitting: Family Medicine

## 2020-01-20 ENCOUNTER — Inpatient Hospital Stay (HOSPITAL_COMMUNITY)
Admission: EM | Admit: 2020-01-20 | Discharge: 2020-01-24 | DRG: 864 | Disposition: A | Discharge status: Home / Self Care | Payer: Medicare PPO | Attending: Internal Medicine | Admitting: Internal Medicine

## 2020-01-20 ENCOUNTER — Emergency Department (HOSPITAL_COMMUNITY): Payer: Medicare PPO

## 2020-01-20 ENCOUNTER — Other Ambulatory Visit: Payer: Self-pay

## 2020-01-20 DIAGNOSIS — Z79899 Other long term (current) drug therapy: Secondary | ICD-10-CM | POA: Diagnosis not present

## 2020-01-20 DIAGNOSIS — E669 Obesity, unspecified: Secondary | ICD-10-CM | POA: Diagnosis present

## 2020-01-20 DIAGNOSIS — G9341 Metabolic encephalopathy: Secondary | ICD-10-CM | POA: Diagnosis present

## 2020-01-20 DIAGNOSIS — Z7982 Long term (current) use of aspirin: Secondary | ICD-10-CM | POA: Diagnosis not present

## 2020-01-20 DIAGNOSIS — Z87891 Personal history of nicotine dependence: Secondary | ICD-10-CM

## 2020-01-20 DIAGNOSIS — N183 Chronic kidney disease, stage 3 unspecified: Secondary | ICD-10-CM | POA: Diagnosis present

## 2020-01-20 DIAGNOSIS — I129 Hypertensive chronic kidney disease with stage 1 through stage 4 chronic kidney disease, or unspecified chronic kidney disease: Secondary | ICD-10-CM | POA: Diagnosis present

## 2020-01-20 DIAGNOSIS — N1832 Chronic kidney disease, stage 3b: Secondary | ICD-10-CM | POA: Diagnosis present

## 2020-01-20 DIAGNOSIS — Z791 Long term (current) use of non-steroidal anti-inflammatories (NSAID): Secondary | ICD-10-CM

## 2020-01-20 DIAGNOSIS — Z20822 Contact with and (suspected) exposure to covid-19: Secondary | ICD-10-CM | POA: Diagnosis present

## 2020-01-20 DIAGNOSIS — R739 Hyperglycemia, unspecified: Secondary | ICD-10-CM | POA: Diagnosis present

## 2020-01-20 DIAGNOSIS — E785 Hyperlipidemia, unspecified: Secondary | ICD-10-CM | POA: Diagnosis present

## 2020-01-20 DIAGNOSIS — E872 Acidosis: Secondary | ICD-10-CM | POA: Diagnosis present

## 2020-01-20 DIAGNOSIS — R509 Fever, unspecified: Principal | ICD-10-CM | POA: Diagnosis present

## 2020-01-20 DIAGNOSIS — J439 Emphysema, unspecified: Secondary | ICD-10-CM | POA: Diagnosis not present

## 2020-01-20 DIAGNOSIS — N1831 Chronic kidney disease, stage 3a: Secondary | ICD-10-CM

## 2020-01-20 DIAGNOSIS — R4182 Altered mental status, unspecified: Secondary | ICD-10-CM | POA: Diagnosis not present

## 2020-01-20 DIAGNOSIS — Z6833 Body mass index (BMI) 33.0-33.9, adult: Secondary | ICD-10-CM

## 2020-01-20 DIAGNOSIS — Z800 Family history of malignant neoplasm of digestive organs: Secondary | ICD-10-CM | POA: Diagnosis not present

## 2020-01-20 DIAGNOSIS — K760 Fatty (change of) liver, not elsewhere classified: Secondary | ICD-10-CM | POA: Diagnosis not present

## 2020-01-20 DIAGNOSIS — I10 Essential (primary) hypertension: Secondary | ICD-10-CM | POA: Diagnosis not present

## 2020-01-20 DIAGNOSIS — F1210 Cannabis abuse, uncomplicated: Secondary | ICD-10-CM | POA: Diagnosis present

## 2020-01-20 DIAGNOSIS — R410 Disorientation, unspecified: Secondary | ICD-10-CM

## 2020-01-20 DIAGNOSIS — R7989 Other specified abnormal findings of blood chemistry: Secondary | ICD-10-CM | POA: Diagnosis not present

## 2020-01-20 LAB — I-STAT CHEM 8, ED
BUN: 24 mg/dL — ABNORMAL HIGH (ref 8–23)
Calcium, Ion: 1.2 mmol/L (ref 1.15–1.40)
Chloride: 103 mmol/L (ref 98–111)
Creatinine, Ser: 1.2 mg/dL — ABNORMAL HIGH (ref 0.44–1.00)
Glucose, Bld: 202 mg/dL — ABNORMAL HIGH (ref 70–99)
HCT: 41 % (ref 36.0–46.0)
Hemoglobin: 13.9 g/dL (ref 12.0–15.0)
Potassium: 4 mmol/L (ref 3.5–5.1)
Sodium: 138 mmol/L (ref 135–145)
TCO2: 27 mmol/L (ref 22–32)

## 2020-01-20 LAB — DIFFERENTIAL
Abs Immature Granulocytes: 0.08 10*3/uL — ABNORMAL HIGH (ref 0.00–0.07)
Basophils Absolute: 0 10*3/uL (ref 0.0–0.1)
Basophils Relative: 0 %
Eosinophils Absolute: 0 10*3/uL (ref 0.0–0.5)
Eosinophils Relative: 0 %
Immature Granulocytes: 1 %
Lymphocytes Relative: 11 %
Lymphs Abs: 1.5 10*3/uL (ref 0.7–4.0)
Monocytes Absolute: 0.4 10*3/uL (ref 0.1–1.0)
Monocytes Relative: 3 %
Neutro Abs: 11.6 10*3/uL — ABNORMAL HIGH (ref 1.7–7.7)
Neutrophils Relative %: 85 %

## 2020-01-20 LAB — SARS CORONAVIRUS 2 BY RT PCR (HOSPITAL ORDER, PERFORMED IN ~~LOC~~ HOSPITAL LAB): SARS Coronavirus 2: NEGATIVE

## 2020-01-20 LAB — URINALYSIS, ROUTINE W REFLEX MICROSCOPIC
Bilirubin Urine: NEGATIVE
Glucose, UA: NEGATIVE mg/dL
Ketones, ur: NEGATIVE mg/dL
Leukocytes,Ua: NEGATIVE
Nitrite: NEGATIVE
Protein, ur: >=300 mg/dL — AB
Specific Gravity, Urine: 1.024 (ref 1.005–1.030)
pH: 7 (ref 5.0–8.0)

## 2020-01-20 LAB — COMPREHENSIVE METABOLIC PANEL
ALT: 19 U/L (ref 0–44)
AST: 22 U/L (ref 15–41)
Albumin: 4.5 g/dL (ref 3.5–5.0)
Alkaline Phosphatase: 92 U/L (ref 38–126)
Anion gap: 15 (ref 5–15)
BUN: 23 mg/dL (ref 8–23)
CO2: 23 mmol/L (ref 22–32)
Calcium: 9.9 mg/dL (ref 8.9–10.3)
Chloride: 100 mmol/L (ref 98–111)
Creatinine, Ser: 1.32 mg/dL — ABNORMAL HIGH (ref 0.44–1.00)
GFR calc Af Amer: 46 mL/min — ABNORMAL LOW (ref >60)
GFR calc non Af Amer: 40 mL/min — ABNORMAL LOW (ref >60)
Glucose, Bld: 204 mg/dL — ABNORMAL HIGH (ref 70–99)
Potassium: 4.3 mmol/L (ref 3.5–5.1)
Sodium: 138 mmol/L (ref 135–145)
Total Bilirubin: 1 mg/dL (ref 0.3–1.2)
Total Protein: 8.2 g/dL — ABNORMAL HIGH (ref 6.5–8.1)

## 2020-01-20 LAB — CBC
HCT: 42.6 % (ref 36.0–46.0)
Hemoglobin: 13.6 g/dL (ref 12.0–15.0)
MCH: 25.9 pg — ABNORMAL LOW (ref 26.0–34.0)
MCHC: 31.9 g/dL (ref 30.0–36.0)
MCV: 81 fL (ref 80.0–100.0)
Platelets: 309 10*3/uL (ref 150–400)
RBC: 5.26 MIL/uL — ABNORMAL HIGH (ref 3.87–5.11)
RDW: 15.4 % (ref 11.5–15.5)
WBC: 13.7 10*3/uL — ABNORMAL HIGH (ref 4.0–10.5)
nRBC: 0 % (ref 0.0–0.2)

## 2020-01-20 LAB — SALICYLATE LEVEL: Salicylate Lvl: <7 mg/dL — ABNORMAL LOW (ref 7.0–30.0)

## 2020-01-20 LAB — PROTIME-INR
INR: 1 (ref 0.8–1.2)
Prothrombin Time: 13 seconds (ref 11.4–15.2)

## 2020-01-20 LAB — GLUCOSE, CAPILLARY: Glucose-Capillary: 133 mg/dL — ABNORMAL HIGH (ref 70–99)

## 2020-01-20 LAB — LACTIC ACID, PLASMA
Lactic Acid, Venous: 3.2 mmol/L (ref 0.5–1.9)
Lactic Acid, Venous: 1.5 mmol/L (ref 0.5–1.9)

## 2020-01-20 LAB — APTT: aPTT: 33 seconds (ref 24–36)

## 2020-01-20 LAB — CBG MONITORING, ED: Glucose-Capillary: 164 mg/dL — ABNORMAL HIGH (ref 70–99)

## 2020-01-20 LAB — ACETAMINOPHEN LEVEL: Acetaminophen (Tylenol), Serum: 11 ug/mL (ref 10–30)

## 2020-01-20 MED ORDER — VANCOMYCIN HCL IN DEXTROSE 1-5 GM/200ML-% IV SOLN: 1000.0000 mg | Freq: Once | INTRAVENOUS | Status: DC

## 2020-01-20 MED ORDER — METRONIDAZOLE IN NACL 5-0.79 MG/ML-% IV SOLN
500.0000 mg | Freq: Once | INTRAVENOUS | Status: AC
  Administered 2020-01-20: 500 mg via INTRAVENOUS
  Filled 2020-01-20: qty 100

## 2020-01-20 MED ORDER — SODIUM CHLORIDE 0.9% FLUSH
3.0000 mL | Freq: Two times a day (BID) | INTRAVENOUS | Status: DC
  Administered 2020-01-20 – 2020-01-24 (×7): 3 mL via INTRAVENOUS

## 2020-01-20 MED ORDER — ALBUTEROL SULFATE (2.5 MG/3ML) 0.083% IN NEBU: 2.5000 mg | INHALATION_SOLUTION | Freq: Four times a day (QID) | RESPIRATORY_TRACT | Status: DC | PRN

## 2020-01-20 MED ORDER — ONDANSETRON HCL 4 MG/2ML IJ SOLN: 4.0000 mg | Freq: Four times a day (QID) | INTRAMUSCULAR | Status: DC | PRN

## 2020-01-20 MED ORDER — SODIUM CHLORIDE 0.9 % IV SOLN: INTRAVENOUS | Status: DC

## 2020-01-20 MED ORDER — SODIUM CHLORIDE 0.9% FLUSH: 3.0000 mL | Freq: Once | INTRAVENOUS | Status: DC

## 2020-01-20 MED ORDER — LORAZEPAM 2 MG/ML IJ SOLN
1.0000 mg | Freq: Once | INTRAMUSCULAR | Status: AC
  Administered 2020-01-20: 1 mg via INTRAVENOUS
  Filled 2020-01-20: qty 1

## 2020-01-20 MED ORDER — VANCOMYCIN HCL IN DEXTROSE 1-5 GM/200ML-% IV SOLN
1000.0000 mg | INTRAVENOUS | Status: DC
  Filled 2020-01-20: qty 200

## 2020-01-20 MED ORDER — ONDANSETRON HCL 4 MG/2ML IJ SOLN
4.0000 mg | Freq: Once | INTRAMUSCULAR | Status: AC
  Administered 2020-01-20: 4 mg via INTRAVENOUS
  Filled 2020-01-20: qty 2

## 2020-01-20 MED ORDER — ACETAMINOPHEN 325 MG PO TABS: 650.0000 mg | ORAL_TABLET | Freq: Four times a day (QID) | ORAL | Status: DC | PRN

## 2020-01-20 MED ORDER — SODIUM CHLORIDE 0.9 % IV SOLN
2.0000 g | Freq: Two times a day (BID) | INTRAVENOUS | Status: DC
  Administered 2020-01-20 – 2020-01-23 (×6): 2 g via INTRAVENOUS
  Filled 2020-01-20 (×7): qty 2

## 2020-01-20 MED ORDER — ACETAMINOPHEN 500 MG PO TABS
1000.0000 mg | ORAL_TABLET | Freq: Once | ORAL | Status: AC
  Administered 2020-01-20: 1000 mg via ORAL
  Filled 2020-01-20: qty 2

## 2020-01-20 MED ORDER — LACTATED RINGERS IV BOLUS (SEPSIS)
250.0000 mL | Freq: Once | INTRAVENOUS | Status: AC
  Administered 2020-01-20: 250 mL via INTRAVENOUS

## 2020-01-20 MED ORDER — ONDANSETRON HCL 4 MG PO TABS: 4.0000 mg | ORAL_TABLET | Freq: Four times a day (QID) | ORAL | Status: DC | PRN

## 2020-01-20 MED ORDER — LACTATED RINGERS IV BOLUS (SEPSIS)
1000.0000 mL | Freq: Once | INTRAVENOUS | Status: AC
  Administered 2020-01-20: 1000 mL via INTRAVENOUS

## 2020-01-20 MED ORDER — VANCOMYCIN HCL 1500 MG/300ML IV SOLN
1500.0000 mg | Freq: Once | INTRAVENOUS | Status: AC
  Administered 2020-01-20: 1500 mg via INTRAVENOUS
  Filled 2020-01-20: qty 300

## 2020-01-20 MED ORDER — ACETAMINOPHEN 650 MG RE SUPP: 650.0000 mg | Freq: Four times a day (QID) | RECTAL | Status: DC | PRN

## 2020-01-20 MED ORDER — ENOXAPARIN SODIUM 40 MG/0.4ML ~~LOC~~ SOLN
40.0000 mg | SUBCUTANEOUS | Status: DC
  Administered 2020-01-20 – 2020-01-23 (×4): 40 mg via SUBCUTANEOUS
  Filled 2020-01-20 (×4): qty 0.4

## 2020-01-20 NOTE — Progress Notes (Signed)
Pharmacy Antibiotic Note  Sherry Kent is a 74 y.o. female admitted on 01/20/2020 with sepsis.  Pharmacy has been consulted for Cefepime and Vancomycin dosing.   Height: 5\' 3"  (160 cm) Weight: 72.6 kg (160 lb) IBW/kg (Calculated) : 52.4  Temp (24hrs), Avg:100 F (37.8 C), Min:98.5 F (36.9 C), Max:101.5 F (38.6 C)  Recent Labs  Lab 01/20/20 1411 01/20/20 1436 01/20/20 1438  WBC 13.7*  --   --   CREATININE 1.32* 1.20*  --   LATICACIDVEN  --   --  3.2*    Estimated Creatinine Clearance: 39.3 mL/min (A) (by C-G formula based on SCr of 1.2 mg/dL (H)).    No Known Allergies  Antimicrobials this admission: 5/20 Cefepime >>  5/20 Vancomycin >>   Dose adjustments this admission: N/a  Microbiology results: Pending   Plan:  - Cefepime 2g IV q24h - Vancomycin 1500mg  IV x 1 dose  - Followed by Vancomycin 1000mg  IV q24h - Monitor patients renal function and urine output  - De-escalate ABX when appropriate   Thank you for allowing pharmacy to be a part of this patient's care.  Duanne Limerick PharmD. BCPS 01/20/2020 3:28 PM

## 2020-01-20 NOTE — H&P (Addendum)
History and Physical    Sherry Kent [id number] DOB: 12/04/1945 DOA: 01/20/2020  Referring MD/NP/PA: Blanchie Dessert, MD PCP: Alycia Rossetti, MD  Patient coming from: Home  Chief Complaint: Altered  I have personally briefly reviewed patient's old medical records in [place]   HPI: Sherry Kent is a 74 y.o. female with medical history significant of hypertension, hyperlipidemia, and anxiety who presents after being found to be acutely altered.  Patient's nephew who helps provide additional history as she is acutely altered.  At baseline patient lives alone and is usually able to complete all of her ADLs without assistance.  She was in her normal state of health last night when her nephew spoke to her over the phone around 7:30 PM.  She had gone to work this morning. At around 10:30-11am coworkers noted that she was talking out of her head before leaving work abruptly with all of her stuff and computer still out.  They were able to get in contact with her nephew who went over to her condo to check on her and found her altered laying on the couch.  He did note that there was a empty pill bottle beside her that was possibly sedative for the cat.  She reportedly has been nauseated and vomited a couple times since being in the emergency department.  Patient does not report any complaints of pain.  Records note patient had a similar episode of altered mental status that occurred back in January where she was intubated and symptoms were thought secondary to urinary tract infection and improved with antibiotics.  Son notes that when he went to her home during that time he found that she had not been taking any of her medications.  ED Course: Upon admission into the emergency department patient was noted to be febrile up to 101.5 F with blood pressures 159/124-170 2/70, and all other vital signs maintained.  CT scan of the brain was negative for any acute abnormalities.  Labs significant  for WBC 13.7, BUN 23, creatinine 1.32, glucose 204, lactic acid 3.2.  UA was negative for any clear signs of infection and chest x-ray appear to be clear.  Sepsis protocol was initiated with full fluid bolus, vancomycin, cefepime, and metronidazole.  TRH called to admit for fever of unknown origin.  Review of Systems  Unable to perform ROS: Mental status change    Past Medical History:  Diagnosis Date  . Anxiety   . H/O hematuria   . History of colon polyps   . Hyperlipidemia   . Hypertension     Past Surgical History:  Procedure Laterality Date  . APPENDECTOMY  8/07  . DILATION AND CURETTAGE OF UTERUS    . EYE SURGERY     bilat cataracts  . TONSILLECTOMY       reports that she quit smoking about 16 years ago. Her smoking use included cigarettes. She has a 30.00 pack-year smoking history. She has never used smokeless tobacco. She reports current alcohol use. She reports that she does not use drugs.  No Known Allergies  Family History  Problem Relation Age of Onset  . Colon cancer Sister     Prior to Admission medications   Medication Sig Start Date End Date Taking? Authorizing Provider  amLODipine (NORVASC) 10 MG tablet Take 1 tablet (10 mg total) by mouth daily. 11/01/19   Alycia Rossetti, MD  aspirin EC 81 MG tablet Take 81 mg by mouth daily.    [provider]  bisoprolol (ZEBETA) 10 MG tablet Take 1 tablet (10 mg total) by mouth daily. 11/01/19   Alycia Rossetti, MD  Calcium Carbonate-Vit D-Min (CALCIUM 1200 PO) Take by mouth. In divided doses    [provider]  cephALEXin (KEFLEX) 250 MG capsule Take 1 capsule (250 mg total) by mouth 3 (three) times daily. 12/24/19   Alycia Rossetti, MD  lisinopril (ZESTRIL) 40 MG tablet TAKE 1 TABLET(40 MG) BY MOUTH DAILY 12/27/19   Alycia Rossetti, MD  meloxicam (MOBIC) 7.5 MG tablet TAKE 1 TABLET(7.5 MG) BY MOUTH DAILY 12/27/19   [place], Modena Nunnery, MD  omeprazole (PRILOSEC) 20 MG capsule Take 1 capsule (20 mg  total) by mouth daily. 11/01/19   Alycia Rossetti, MD  rosuvastatin (CRESTOR) 20 MG tablet TAKE 1 TABLET(20 MG) BY MOUTH DAILY 12/06/19   Alycia Rossetti, MD  sertraline (ZOLOFT) 50 MG tablet TAKE 1 TABLET(50 MG) BY MOUTH DAILY 12/27/19   Alycia Rossetti, MD    Physical Exam:  Constitutional: Elderly female who appears sick and has difficulty following simple commands at this time. Vitals:   01/20/20 1409 01/20/20 1413 01/20/20 1415 01/20/20 1500  BP:    (!) 172/70  Pulse:      Resp:    16  Temp:   (!) 101.5 F (38.6 C)   TempSrc:   Rectal   SpO2: 100%  100%   Weight:  72.6 kg    Height:  5\' 3"  (1.6 m)     Eyes: PERRL, lids and conjunctivae normal ENMT: Mucous membranes are dry.  Posterior pharynx clear of any exudate or lesions.  Neck: normal, supple, no masses, no thyromegaly Respiratory: clear to auscultation bilaterally, no wheezing, no crackles. Normal respiratory effort. No accessory muscle use.  Cardiovascular: Regular rate and rhythm, no murmurs / rubs / gallops. No extremity edema. 2+ pedal pulses. No carotid bruits.  Abdomen: no tenderness, no masses palpated. No hepatosplenomegaly. Bowel sounds positive.  Musculoskeletal: no clubbing / cyanosis. No joint deformity upper and lower extremities. Good ROM, no contractures. Normal muscle tone.  Skin: Diaphoretic.  No rash or lesions appreciated. Neurologic: CN 2-12 grossly intact. Sensation intact, DTR normal. Strength 5/5 in all 4.  Psychiatric: Alert, but not oriented to person place or time.    Labs on Admission: I have personally reviewed following labs and imaging studies  CBC: Recent Labs  Lab 01/20/20 1411 01/20/20 1436  WBC 13.7*  --   NEUTROABS 11.6*  --   HGB 13.6 13.9  HCT 42.6 41.0  MCV 81.0  --   PLT 309  --    Basic Metabolic Panel: Recent Labs  Lab 01/20/20 1411 01/20/20 1436  NA 138 138  K 4.3 4.0  CL 100 103  CO2 23  --   GLUCOSE 204* 202*  BUN 23 24*  CREATININE 1.32* 1.20*  CALCIUM  9.9  --    GFR: Estimated Creatinine Clearance: 39.3 mL/min (A) (by C-G formula based on SCr of 1.2 mg/dL (H)). Liver Function Tests: Recent Labs  Lab 01/20/20 1411  AST 22  ALT 19  ALKPHOS 92  BILITOT 1.0  PROT 8.2*  ALBUMIN 4.5   No results for input(s): LIPASE, AMYLASE in the last 168 hours. No results for input(s): AMMONIA in the last 168 hours. Coagulation Profile: Recent Labs  Lab 01/20/20 1411  INR 1.0   Cardiac Enzymes: No results for input(s): CKTOTAL, CKMB, CKMBINDEX, TROPONINI in the last 168 hours. BNP (last 3  results) No results for input(s): PROBNP in the last 8760 hours. HbA1C: No results for input(s): HGBA1C in the last 72 hours. CBG: Recent Labs  Lab 01/20/20 1406  GLUCAP 164*   Lipid Profile: No results for input(s): CHOL, HDL, LDLCALC, TRIG, CHOLHDL, LDLDIRECT in the last 72 hours. Thyroid Function Tests: No results for input(s): TSH, T4TOTAL, FREET4, T3FREE, THYROIDAB in the last 72 hours. Anemia Panel: No results for input(s): VITAMINB12, FOLATE, FERRITIN, TIBC, IRON, RETICCTPCT in the last 72 hours. Urine analysis:    Component Value Date/Time   COLORURINE YELLOW 01/20/2020 1522   APPEARANCEUR CLEAR 01/20/2020 1522   LABSPEC 1.024 01/20/2020 1522   PHURINE 7.0 01/20/2020 1522   GLUCOSEU NEGATIVE 01/20/2020 1522   HGBUR SMALL (A) 01/20/2020 1522   BILIRUBINUR NEGATIVE 01/20/2020 1522   [place] 01/20/2020 1522   PROTEINUR >=300 (A) 01/20/2020 1522   UROBILINOGEN 0.2 12/11/2011 1102   NITRITE NEGATIVE 01/20/2020 1522   LEUKOCYTESUR NEGATIVE 01/20/2020 1522   Sepsis Labs: No results found for this or any previous visit (from the past 240 hour(s)).   Radiological Exams on Admission: CT HEAD WO CONTRAST  Result Date: 01/20/2020 CLINICAL DATA:  Confused, possible fall EXAM: CT HEAD WITHOUT CONTRAST TECHNIQUE: Contiguous axial images were obtained from the base of the skull through the vertex without intravenous contrast.  COMPARISON:  09/15/2019 FINDINGS: Brain: There is no acute intracranial hemorrhage, mass effect, or edema. Gray-white differentiation is preserved. There is no extra-axial fluid collection. Ventricles and sulci are stable in size and configuration. Patchy hypoattenuation in the supratentorial white matter is nonspecific but probably reflects stable chronic microvascular ischemic changes. Vascular: There is mild atherosclerotic calcification at the skull base. Skull: Calvarium is unremarkable. Sinuses/Orbits: Chronic lobular paranasal sinus mucosal thickening with bilateral maxillary sinus air-fluid levels. No acute orbital finding. Other: None. IMPRESSION: No acute intracranial abnormality. Chronic diffuse paranasal sinus inflammatory changes. There are nonspecific air-fluid levels within the maxillary sinuses, which can reflect acute sinusitis in the appropriate clinical setting. Electronically Signed   By: Macy Mis M.D.   On: 01/20/2020 14:43   DG Chest Port 1 View  Result Date: 01/20/2020 CLINICAL DATA:  Altered mental status EXAM: PORTABLE CHEST 1 VIEW COMPARISON:  09/16/2019 FINDINGS: The heart size and mediastinal contours are within normal limits. Both lungs are clear. The visualized skeletal structures are unremarkable. IMPRESSION: No active disease. Electronically Signed   By: Franchot Gallo M.D.   On: 01/20/2020 14:49    EKG: Independently reviewed.  Sinus rhythm at 84 bpm  Assessment/Plan  Fever of unknown origin Lactic acidosis: Acute patient presented initially febrile up to 101.5 F with WBC elevated at 13.7  and lactic acid elevated at 3.2 given concern for sepsis.  Urinalysis and chest x-ray relatively unremarkable for clear source of infection.  Question possibility of underlying infection versus medication effect. -Admit to medical telemetry -Follow-up blood and urine cultures -Continue empiric antibiotics of vancomycin and cefepime -De-escalate when medically  appropriate -Trend lactic acid level -Tylenol as needed for fever  -IV fluids at 75 mL/h  Acute metabolic encephalopathy: Patient found to be acutely altered and febrile.  Unclear cause of symptoms at this time.  No meningismus signs noted on physical exam.  CT scan of the brain negative for any acute abnormalities. -Continue neurochecks -Check UDS, salicylate level, and acetaminophen level -Son to give update when able to perform pill check and figure out what was in the empty pill bottle -May warrant further imaging if symptoms do not  seem to be improving  Chronic kidney disease stage IIIb: Creatinine 1.32 which appears near patient's baseline. -Continue to monitor  Hyperglycemia: Acute.  Initial glucose elevated up to 202.  Suspect reactive in nature. -Continue to monitor and place on sliding scale insulin as needed  History of Marijuana use: Patient previously noted to have UDS positive for marijuana during last admission. -Follow-up urine drug screen.   **Will need to complete med reconciliation once completed by pharmacy**  DVT prophylaxis: Lovenox Code Status: Full Family Communication: Son updated at bedside Disposition Plan: Likely discharge home once medically stable Consults called: None  Admission status: Inpatient   Norval Morton MD [facility] Pager [phone]   If 7PM-7AM, please contact night-coverage [email or web address] Password [facility]  01/20/2020, 4:04 PM

## 2020-01-20 NOTE — ED Provider Notes (Signed)
[facility] EMERGENCY DEPARTMENT Provider Note   CSN: [id number] Arrival date & time: 01/20/20  1346     History No chief complaint on file.   Sherry Kent is a 74 y.o. female.  Patient is a 74 year old female with a prior history of hypertension, hyperlipidemia, chronic kidney disease who is presenting today with her nephew for altered mental status.  Nephew reports he spoke with her last night around 7 PM as she was her normal self.  She was getting ready to go to the beach with friends today after work.  She had not had any recent changes and had been feeling well.  He then was called by her coworkers around 1030 this morning who reported after she got to work she started not acting herself.  She was mumbling gibberish and did not appear well.  They took her home and when he went to check on her he reports she was not saying anything that made any sense she was shivering and not acting herself.  Normally she lives alone, takes care of all her ADLs and drives and goes to work.  Patient reports she is not having pain anywhere but is feeling nauseated and has vomited several times in the emergency room.  She has no chest pain, shortness of breath, abdominal pain.  Of note patient had a similar episode in January of this year where she had acute metabolic encephalopathy requiring intubation and after numerous test and evaluation there was no neurologic cause for her symptoms all of her cultures were negative but was presumed to have a UTI and did improve on antibiotics and was able to go home from the hospital.  She has had no episodes since that event until now.  The history is provided by the patient and a relative.       Past Medical History:  Diagnosis Date  . Anxiety   . H/O hematuria   . History of colon polyps   . Hyperlipidemia   . Hypertension     Patient Active Problem List   Diagnosis Date Noted  . Metabolic encephalopathy   . Airway compromise  09/15/2019  . Acute renal injury ([place]) 09/15/2019  . Acute encephalopathy 09/15/2019  . Chronic kidney disease 02/15/2016  . Carotid artery stenosis 02/14/2016  . Osteopenia 06/05/2015  . Postmenopausal 06/05/2015  . Estrogen deficiency 06/05/2015  . Breast cancer screening 06/05/2015  . Depression 05/27/2013  . Glucose intolerance (impaired glucose tolerance) 05/27/2013  . Hypertension   . Hyperlipidemia   . Panic attacks   . History of colon polyps   . H/O hematuria   . TIA (transient ischemic attack)   . Vitamin D deficiency     Past Surgical History:  Procedure Laterality Date  . APPENDECTOMY  8/07  . DILATION AND CURETTAGE OF UTERUS    . EYE SURGERY     bilat cataracts  . TONSILLECTOMY       OB History   No obstetric history on file.     Family History  Problem Relation Age of Onset  . Colon cancer Sister     Social History   Tobacco Use  . Smoking status: Former Smoker    Packs/day: 1.00    Years: 30.00    Pack years: 30.00    Types: Cigarettes    Quit date: 10/27/2003    Years since quitting: 16.2  . Smokeless tobacco: Never Used  Substance Use Topics  . Alcohol use: Yes  Alcohol/week: 0.0 standard drinks    Comment: socially  . Drug use: No    Home Medications Prior to Admission medications   Medication Sig Start Date End Date Taking? Authorizing Provider  amLODipine (NORVASC) 10 MG tablet Take 1 tablet (10 mg total) by mouth daily. 11/01/19   Alycia Rossetti, MD  aspirin EC 81 MG tablet Take 81 mg by mouth daily.    [provider]  bisoprolol (ZEBETA) 10 MG tablet Take 1 tablet (10 mg total) by mouth daily. 11/01/19   Alycia Rossetti, MD  Calcium Carbonate-Vit D-Min (CALCIUM 1200 PO) Take by mouth. In divided doses    [provider]  cephALEXin (KEFLEX) 250 MG capsule Take 1 capsule (250 mg total) by mouth 3 (three) times daily. 12/24/19   Alycia Rossetti, MD  lisinopril (ZESTRIL) 40 MG tablet TAKE 1 TABLET(40 MG) BY  MOUTH DAILY 12/27/19   Alycia Rossetti, MD  meloxicam (MOBIC) 7.5 MG tablet TAKE 1 TABLET(7.5 MG) BY MOUTH DAILY 12/27/19   [place], Modena Nunnery, MD  omeprazole (PRILOSEC) 20 MG capsule Take 1 capsule (20 mg total) by mouth daily. 11/01/19   Alycia Rossetti, MD  rosuvastatin (CRESTOR) 20 MG tablet TAKE 1 TABLET(20 MG) BY MOUTH DAILY 12/06/19   Alycia Rossetti, MD  sertraline (ZOLOFT) 50 MG tablet TAKE 1 TABLET(50 MG) BY MOUTH DAILY 12/27/19   Alycia Rossetti, MD    Allergies    Patient has no known allergies.  Review of Systems   Review of Systems  All other systems reviewed and are negative.   Physical Exam Updated Vital Signs BP (!) 159/124 (BP Location: Right Arm)   Pulse 81   Temp 98.5 F (36.9 C) (Oral)   Resp 18   Ht 5\' 3"  (1.6 m)   Wt 72.6 kg   SpO2 100%   BMI 28.34 kg/m   Physical Exam Vitals and nursing note reviewed.  Constitutional:      Appearance: She is well-developed. She is obese. She is ill-appearing.  HENT:     Head: Normocephalic and atraumatic.     Mouth/Throat:     Mouth: Mucous membranes are moist.  Eyes:     Pupils: Pupils are equal, round, and reactive to light.  Cardiovascular:     Rate and Rhythm: Normal rate and regular rhythm.     Pulses: Normal pulses.     Heart sounds: Normal heart sounds. No murmur. No friction rub.  Pulmonary:     Effort: Pulmonary effort is normal.     Breath sounds: Normal breath sounds. No wheezing or rales.  Abdominal:     General: Bowel sounds are normal. There is no distension.     Palpations: Abdomen is soft.     Tenderness: There is no abdominal tenderness. There is no guarding or rebound.  Musculoskeletal:        General: No tenderness. Normal range of motion.     Cervical back: Normal range of motion and neck supple. No tenderness.     Comments: No edema  Skin:    General: Skin is warm and dry.     Capillary Refill: Capillary refill takes 2 to 3 seconds.     Findings: No rash.     Comments: Hot to touch    Neurological:     Mental Status: She is alert.     Cranial Nerves: No cranial nerve deficit.     Comments: Pt sometimes answers questions appropriately but other times speaks  non-sense.  No visual field cuts, moving all ext, no pronator drift or sensation changes.  Psychiatric:     Comments: altered     ED Results / Procedures / Treatments   Labs (all labs ordered are listed, but only abnormal results are displayed) Labs Reviewed  CBC - Abnormal; Notable for the following components:      Result Value   WBC 13.7 (*)    RBC 5.26 (*)    MCH 25.9 (*)    All other components within normal limits  DIFFERENTIAL - Abnormal; Notable for the following components:   Neutro Abs 11.6 (*)    Abs Immature Granulocytes 0.08 (*)    All other components within normal limits  COMPREHENSIVE METABOLIC PANEL - Abnormal; Notable for the following components:   Glucose, Bld 204 (*)    Creatinine, Ser 1.32 (*)    Total Protein 8.2 (*)    GFR calc non Af Amer 40 (*)    GFR calc Af Amer 46 (*)    All other components within normal limits  LACTIC ACID, PLASMA - Abnormal; Notable for the following components:   Lactic Acid, Venous 3.2 (*)    All other components within normal limits  URINALYSIS, ROUTINE W REFLEX MICROSCOPIC - Abnormal; Notable for the following components:   Hgb urine dipstick SMALL (*)    Protein, ur >=300 (*)    Bacteria, UA RARE (*)    All other components within normal limits  I-STAT CHEM 8, ED - Abnormal; Notable for the following components:   BUN 24 (*)    Creatinine, Ser 1.20 (*)    Glucose, Bld 202 (*)    All other components within normal limits  CBG MONITORING, ED - Abnormal; Notable for the following components:   Glucose-Capillary 164 (*)    All other components within normal limits  CULTURE, BLOOD (ROUTINE X 2)  CULTURE, BLOOD (ROUTINE X 2)  URINE CULTURE  SARS CORONAVIRUS 2 BY RT PCR (HOSPITAL ORDER, [facility])  PROTIME-INR  APTT   LACTIC ACID, PLASMA    EKG EKG Interpretation  Date/Time:  Thursday Jan 20 2020 14:04:03 EDT Ventricular Rate:  84 PR Interval:    QRS Duration: 95 QT Interval:  389 QTC Calculation: 460 R Axis:   48 Text Interpretation: Sinus rhythm Abnormal R-wave progression, early transition No significant change since last tracing Confirmed by Blanchie Dessert [phone]) on 01/20/2020 2:50:00 PM   Radiology CT HEAD WO CONTRAST  Result Date: 01/20/2020 CLINICAL DATA:  Confused, possible fall EXAM: CT HEAD WITHOUT CONTRAST TECHNIQUE: Contiguous axial images were obtained from the base of the skull through the vertex without intravenous contrast. COMPARISON:  09/15/2019 FINDINGS: Brain: There is no acute intracranial hemorrhage, mass effect, or edema. Gray-white differentiation is preserved. There is no extra-axial fluid collection. Ventricles and sulci are stable in size and configuration. Patchy hypoattenuation in the supratentorial white matter is nonspecific but probably reflects stable chronic microvascular ischemic changes. Vascular: There is mild atherosclerotic calcification at the skull base. Skull: Calvarium is unremarkable. Sinuses/Orbits: Chronic lobular paranasal sinus mucosal thickening with bilateral maxillary sinus air-fluid levels. No acute orbital finding. Other: None. IMPRESSION: No acute intracranial abnormality. Chronic diffuse paranasal sinus inflammatory changes. There are nonspecific air-fluid levels within the maxillary sinuses, which can reflect acute sinusitis in the appropriate clinical setting. Electronically Signed   By: Macy Mis M.D.   On: 01/20/2020 14:43    Procedures Procedures (including critical care time)  Medications Ordered in  ED Medications  sodium chloride flush (NS) 0.9 % injection 3 mL (has no administration in time range)  lactated ringers bolus 1,000 mL (1,000 mLs Intravenous New Bag/Given 01/20/20 1436)  acetaminophen (TYLENOL) tablet 1,000 mg (1,000 mg  Oral Given 01/20/20 1434)  ondansetron (ZOFRAN) injection 4 mg (4 mg Intravenous Given 01/20/20 1434)    ED Course  I have reviewed the triage vital signs and the nursing notes.  Pertinent labs & imaging results that were available during my care of the patient were reviewed by me and considered in my medical decision making (see chart for details).    MDM Rules/Calculators/A&P                      Patient is a 74 year old female presenting today with altered mental status and aphasia.  Patient was last normal per her nephew last night at 7 PM but she went to work this morning and office staff reported she started acting unusual late this morning.  Patient on exam can sometimes answer questions appropriately but appears slightly distressed and chilled.  She has had multiple episodes of vomiting while in the room.  Patient found to have a temperature of 101.5 rectally.  No obvious source for infection.  No abdominal pain, chest pain or shortness of breath.  Satting 100% on room air.  She has received her Covid vaccine.  Patient had a similar episode in January that resulted in intubation.  At that time was thought to be due to a UTI and she improved with antibiotics.  Will initiate sepsis work-up.  Lower suspicion for stroke at this time given her systemic symptoms.  In recent evaluation. EKG without acute changes, leukocytosis of 13,000 and stable hemoglobin, CMP with unchanged renal function with creatinine of 1.32, chest x-ray without acute findings and head CT with chronic diffuse paranasal inflammatory changes but otherwise no acute findings.  Patient now was given Tylenol and IV fluids.  Lactic acid is pending.  4:02 PM Lactate elevated to 3.2.  Pt covered with 79mL/kg of LR. UA pendingl.  Final Clinical Impression(s) / ED Diagnoses Final diagnoses:  None    Rx / DC Orders ED Discharge Orders    None       Blanchie Dessert, MD 01/20/20 [phone]

## 2020-01-21 ENCOUNTER — Other Ambulatory Visit: Payer: Self-pay

## 2020-01-21 ENCOUNTER — Encounter (HOSPITAL_COMMUNITY): Payer: Self-pay | Admitting: Internal Medicine

## 2020-01-21 ENCOUNTER — Inpatient Hospital Stay (HOSPITAL_COMMUNITY): Payer: Medicare PPO

## 2020-01-21 DIAGNOSIS — Z87891 Personal history of nicotine dependence: Secondary | ICD-10-CM | POA: Diagnosis not present

## 2020-01-21 DIAGNOSIS — R7989 Other specified abnormal findings of blood chemistry: Secondary | ICD-10-CM | POA: Diagnosis not present

## 2020-01-21 DIAGNOSIS — N1831 Chronic kidney disease, stage 3a: Secondary | ICD-10-CM | POA: Diagnosis not present

## 2020-01-21 DIAGNOSIS — F1210 Cannabis abuse, uncomplicated: Secondary | ICD-10-CM | POA: Diagnosis present

## 2020-01-21 DIAGNOSIS — I129 Hypertensive chronic kidney disease with stage 1 through stage 4 chronic kidney disease, or unspecified chronic kidney disease: Secondary | ICD-10-CM | POA: Diagnosis present

## 2020-01-21 DIAGNOSIS — R410 Disorientation, unspecified: Secondary | ICD-10-CM | POA: Diagnosis present

## 2020-01-21 DIAGNOSIS — R509 Fever, unspecified: Secondary | ICD-10-CM | POA: Diagnosis present

## 2020-01-21 DIAGNOSIS — N1832 Chronic kidney disease, stage 3b: Secondary | ICD-10-CM | POA: Diagnosis present

## 2020-01-21 DIAGNOSIS — E872 Acidosis: Secondary | ICD-10-CM | POA: Diagnosis present

## 2020-01-21 DIAGNOSIS — E785 Hyperlipidemia, unspecified: Secondary | ICD-10-CM | POA: Diagnosis present

## 2020-01-21 DIAGNOSIS — R739 Hyperglycemia, unspecified: Secondary | ICD-10-CM | POA: Diagnosis present

## 2020-01-21 DIAGNOSIS — Z800 Family history of malignant neoplasm of digestive organs: Secondary | ICD-10-CM | POA: Diagnosis not present

## 2020-01-21 DIAGNOSIS — Z20822 Contact with and (suspected) exposure to covid-19: Secondary | ICD-10-CM | POA: Diagnosis present

## 2020-01-21 DIAGNOSIS — E669 Obesity, unspecified: Secondary | ICD-10-CM | POA: Diagnosis present

## 2020-01-21 DIAGNOSIS — Z79899 Other long term (current) drug therapy: Secondary | ICD-10-CM | POA: Diagnosis not present

## 2020-01-21 DIAGNOSIS — G9341 Metabolic encephalopathy: Secondary | ICD-10-CM | POA: Diagnosis present

## 2020-01-21 DIAGNOSIS — Z7982 Long term (current) use of aspirin: Secondary | ICD-10-CM | POA: Diagnosis not present

## 2020-01-21 DIAGNOSIS — Z791 Long term (current) use of non-steroidal anti-inflammatories (NSAID): Secondary | ICD-10-CM | POA: Diagnosis not present

## 2020-01-21 DIAGNOSIS — Z6833 Body mass index (BMI) 33.0-33.9, adult: Secondary | ICD-10-CM | POA: Diagnosis not present

## 2020-01-21 LAB — RESPIRATORY PANEL BY PCR

## 2020-01-21 LAB — BASIC METABOLIC PANEL
Anion gap: 11 (ref 5–15)
BUN: 16 mg/dL (ref 8–23)
CO2: 23 mmol/L (ref 22–32)
Calcium: 9.2 mg/dL (ref 8.9–10.3)
Chloride: 105 mmol/L (ref 98–111)
Creatinine, Ser: 1.07 mg/dL — ABNORMAL HIGH (ref 0.44–1.00)
GFR calc Af Amer: 59 mL/min — ABNORMAL LOW (ref >60)
GFR calc non Af Amer: 51 mL/min — ABNORMAL LOW (ref >60)
Glucose, Bld: 116 mg/dL — ABNORMAL HIGH (ref 70–99)
Potassium: 3.7 mmol/L (ref 3.5–5.1)
Sodium: 139 mmol/L (ref 135–145)

## 2020-01-21 LAB — RAPID URINE DRUG SCREEN, HOSP PERFORMED
Amphetamines: NOT DETECTED
Barbiturates: NOT DETECTED
Benzodiazepines: NOT DETECTED
Cocaine: NOT DETECTED
Opiates: NOT DETECTED
Tetrahydrocannabinol: POSITIVE — AB

## 2020-01-21 LAB — CBC
HCT: 38.5 % (ref 36.0–46.0)
Hemoglobin: 12.3 g/dL (ref 12.0–15.0)
MCH: 25.9 pg — ABNORMAL LOW (ref 26.0–34.0)
MCHC: 31.9 g/dL (ref 30.0–36.0)
MCV: 81.2 fL (ref 80.0–100.0)
Platelets: 268 10*3/uL (ref 150–400)
RBC: 4.74 MIL/uL (ref 3.87–5.11)
RDW: 15.7 % — ABNORMAL HIGH (ref 11.5–15.5)
WBC: 14.4 10*3/uL — ABNORMAL HIGH (ref 4.0–10.5)
nRBC: 0 % (ref 0.0–0.2)

## 2020-01-21 LAB — URINE CULTURE: Culture: NO GROWTH

## 2020-01-21 MED ORDER — AMLODIPINE BESYLATE 10 MG PO TABS
10.0000 mg | ORAL_TABLET | Freq: Every day | ORAL | Status: DC
  Administered 2020-01-21 – 2020-01-24 (×4): 10 mg via ORAL
  Filled 2020-01-21 (×4): qty 1

## 2020-01-21 MED ORDER — ROSUVASTATIN CALCIUM 20 MG PO TABS
20.0000 mg | ORAL_TABLET | Freq: Every day | ORAL | Status: DC
  Administered 2020-01-21 – 2020-01-24 (×4): 20 mg via ORAL
  Filled 2020-01-21 (×4): qty 1

## 2020-01-21 MED ORDER — BISOPROLOL FUMARATE 10 MG PO TABS
10.0000 mg | ORAL_TABLET | Freq: Every day | ORAL | Status: DC
  Administered 2020-01-21 – 2020-01-24 (×4): 10 mg via ORAL
  Filled 2020-01-21 (×4): qty 1

## 2020-01-21 NOTE — Progress Notes (Signed)
Received verbal order from [place]. Powers from Infection Prevention to d/c droplet isolation precautions.

## 2020-01-21 NOTE — Progress Notes (Addendum)
PROGRESS NOTE        PATIENT DETAILS Name: Sherry Kent Age: 74 y.o. Sex: female Date of Birth: 1945/10/14 Admit Date: 01/20/2020 Admitting Physician Evalee Mutton Kristeen Mans, MD [id number], Modena Nunnery, MD  Brief Narrative: Patient is a 74 y.o. female with history of HTN, HLD, anxiety who presented to the ED with confusion-was found to be febrile-she was started on broad-spectrum antimicrobial therapy and subsequently admitted to [facility] for further eval and treatment.  Interestingly-patient had a similar but a more severe presentation in January 2021-required intubation for airway protection-ICU admission-ultimately thought to be secondary to UTI.  Had extensive work-up including EEG (short encephalopathy) and spinal tap (CSF culture neg, HSV 1/HSV 2 PCR neg).  Significant events: 5/20>> admit with fever and acute metabolic encephalopathy. [id number] back to baseline-completely awake and alert  Antimicrobial therapy: Cefepime: 5/20>> Flagyl: 5/20 x1 Vancomycin: 5/20>> 5/21  Microbiology data: 5/20: Blood culture>> no growth 5/20: Urine culture>> pending 5/20: Respiratory virus panel>> negative 5/20:SARS Coronavirus 2>>negative  Significant studies: 5/20: CT head without contrast>> no acute abnormalities 5/20: Chest x-ray>> no active disease  Procedures : None  Consults: None  DVT Prophylaxis : Prophylactic Lovenox   Subjective: Completely awake/alert-afebrile overnight.  Denies any headaches.  Denies any neck pain/stiffness.  No rash.  No recent history of subjective fever/diarrhea/nausea/vomiting.  Assessment/Plan: Fever with acute metabolic encephalopathy: Unknown foci of infection-she is significantly improved and back to baseline.  Afebrile overnight-but continues to have leukocytosis.  Her overall clinical symptomatology/exam/rapid improvement is not consistent with encephalitis/meningitis.  UA/chest x-ray negative for  infection.  Blood culture negative so far-urine culture pending at this point.  No other foci of infection is apparent by history/exam.  Plan is to stop vancomycin-continue cefepime-and follow cultures.  Interestingly-she had a similar but much more severe infection in January 2021 requiring intubation-ultimately thought to have encephalopathy in the setting of UTI.  I wonder if she is susceptible to severe encephalopathy in the setting of infectious etiology.   Per patient-although she was "out of it"-she claims that she could not express herself-hence we will go and obtain an MRI of the brain.  This was not done performed during her last admission. UDS pending as well  CKD stage IIIb: Creatinine close to baseline  Proteinuria: Unknown etiology-has been ongoing for the past several years-since not a acute issue-stable for outpatient follow-up.  HTN: BP creeping up-stop IV fluids-restart bisoprolol and amlodipine.  Follow-if blood pressure still remains elevated-restart lisinopril over the next few days  HLD: Resume statin  Morbid Obesity: Estimated body mass index is 33.08 kg/m as calculated from the following:   Height as of this encounter: 5\' 3"  (1.6 m).   Weight as of this encounter: 84.7 kg.    Diet: Diet Order            Diet Heart Room service appropriate? Yes; Fluid consistency: Thin  Diet effective now               Code Status: Full code   Family Communication: Nephew over the [phone]  Disposition Plan: Status is: Inpatient  Remains inpatient appropriate because:Inpatient level of care appropriate due to severity of illness   Dispo: The patient is from: Home              Anticipated d/c is to: Home  Anticipated d/c date is: 2 days              Patient currently is not medically stable to d/c.   Barriers to Discharge: Fever with acute encephalopathy-unknown etiology-remains on IV antibiotics-not appropriate to transition to oral  antimicrobial therapy.  Antimicrobial agents: Anti-infectives (From admission, onward)   Start     Dose/Rate Route Frequency Ordered Stop   01/21/20 1630  vancomycin (VANCOCIN) IVPB 1000 mg/200 mL premix     1,000 mg 200 mL/hr over 60 Minutes Intravenous Every 24 hours 01/20/20 1528     01/20/20 1530  ceFEPIme (MAXIPIME) 2 g in sodium chloride 0.9 % 100 mL IVPB     2 g 200 mL/hr over 30 Minutes Intravenous Every 12 hours 01/20/20 1523     01/20/20 1530  metroNIDAZOLE (FLAGYL) IVPB 500 mg     500 mg 100 mL/hr over 60 Minutes Intravenous  Once 01/20/20 1523 01/20/20 1638   01/20/20 1530  vancomycin (VANCOCIN) IVPB 1000 mg/200 mL premix  Status:  Discontinued     1,000 mg 200 mL/hr over 60 Minutes Intravenous  Once 01/20/20 1523 01/20/20 1528   01/20/20 1530  vancomycin (VANCOREADY) IVPB 1500 mg/300 mL     1,500 mg 150 mL/hr over 120 Minutes Intravenous  Once 01/20/20 1528 01/20/20 2056       Time spent: 25 minutes-Greater than 50% of this time was spent in counseling, explanation of diagnosis, planning of further management, and coordination of care.  MEDICATIONS: Scheduled Meds: . enoxaparin (LOVENOX) injection  40 mg Subcutaneous Q24H  . sodium chloride flush  3 mL Intravenous Once  . sodium chloride flush  3 mL Intravenous Q12H   Continuous Infusions: . sodium chloride 75 mL/hr at 01/20/20 2132  . ceFEPime (MAXIPIME) IV Stopped (01/21/20 0500)  . vancomycin     PRN Meds:.acetaminophen **OR** acetaminophen, albuterol, ondansetron **OR** ondansetron (ZOFRAN) IV   PHYSICAL EXAM: Vital signs: Vitals:   01/21/20 0443 01/21/20 0446 01/21/20 0736 01/21/20 1300  BP: 140/69 140/69 (!) 144/90 (!) 153/84  Pulse: 86 77 88 76  Resp: 18 19 18 19   Temp: 98.8 F (37.1 C)  97.7 F (36.5 C) 98.2 F (36.8 C)  TempSrc: Oral  Oral Oral  SpO2: 92% 95% 91% 94%  Weight:      Height:       Filed Weights   01/20/20 1413 01/20/20 2056  Weight: 72.6 kg 84.7 kg   Body mass index is  33.08 kg/m.   Gen Exam:Alert awake-not in any distress HEENT:atraumatic, normocephalic Chest: B/L clear to auscultation anteriorly CVS:S1S2 regular Abdomen:soft non tender, non distended Extremities:no edema Neurology: Non focal Skin: no rash  I have personally reviewed following labs and imaging studies  LABORATORY DATA: CBC: Recent Labs  Lab 01/20/20 1411 01/20/20 1436 01/21/20 0321  WBC 13.7*  --  14.4*  NEUTROABS 11.6*  --   --   HGB 13.6 13.9 12.3  HCT 42.6 41.0 38.5  MCV 81.0  --  81.2  PLT 309  --  [id number]    Basic Metabolic Panel: Recent Labs  Lab 01/20/20 1411 01/20/20 1436 01/21/20 0321  NA 138 138 139  K 4.3 4.0 3.7  CL 100 103 105  CO2 23  --  23  GLUCOSE 204* 202* 116*  BUN 23 24* 16  CREATININE 1.32* 1.20* 1.07*  CALCIUM 9.9  --  9.2    GFR: Estimated Creatinine Clearance: 47.6 mL/min (A) (by C-G formula based on SCr of 1.07 mg/dL (  H)).  Liver Function Tests: Recent Labs  Lab 01/20/20 1411  AST 22  ALT 19  ALKPHOS 92  BILITOT 1.0  PROT 8.2*  ALBUMIN 4.5   No results for input(s): LIPASE, AMYLASE in the last 168 hours. No results for input(s): AMMONIA in the last 168 hours.  Coagulation Profile: Recent Labs  Lab 01/20/20 1411  INR 1.0    Cardiac Enzymes: No results for input(s): CKTOTAL, CKMB, CKMBINDEX, TROPONINI in the last 168 hours.  BNP (last 3 results) No results for input(s): PROBNP in the last 8760 hours.  Lipid Profile: No results for input(s): CHOL, HDL, LDLCALC, TRIG, CHOLHDL, LDLDIRECT in the last 72 hours.  Thyroid Function Tests: No results for input(s): TSH, T4TOTAL, FREET4, T3FREE, THYROIDAB in the last 72 hours.  Anemia Panel: No results for input(s): VITAMINB12, FOLATE, FERRITIN, TIBC, IRON, RETICCTPCT in the last 72 hours.  Urine analysis:    Component Value Date/Time   COLORURINE YELLOW 01/20/2020 1522   APPEARANCEUR CLEAR 01/20/2020 1522   LABSPEC 1.024 01/20/2020 1522   PHURINE 7.0 01/20/2020 1522    GLUCOSEU NEGATIVE 01/20/2020 1522   HGBUR SMALL (A) 01/20/2020 1522   BILIRUBINUR NEGATIVE 01/20/2020 1522   KETONESUR NEGATIVE 01/20/2020 1522   PROTEINUR >=300 (A) 01/20/2020 1522   UROBILINOGEN 0.2 12/11/2011 1102   NITRITE NEGATIVE 01/20/2020 1522   LEUKOCYTESUR NEGATIVE 01/20/2020 1522    Sepsis Labs: Lactic Acid, Venous    Component Value Date/Time   LATICACIDVEN 1.5 01/20/2020 1758    MICROBIOLOGY: Recent Results (from the past 240 hour(s))  Blood Culture (routine x 2)     Status: None (Preliminary result)   Collection Time: 01/20/20  2:31 PM   Specimen: BLOOD  Result Value Ref Range Status   Specimen Description BLOOD RIGHT ANTECUBITAL  Final   Special Requests   Final    BOTTLES DRAWN AEROBIC AND ANAEROBIC Blood Culture adequate volume   Culture   Final    NO GROWTH < 24 HOURS Performed at [facility], [place] [address] [place], [place] [address]    Report Status PENDING  Incomplete  Blood Culture (routine x 2)     Status: None (Preliminary result)   Collection Time: 01/20/20  2:38 PM   Specimen: BLOOD  Result Value Ref Range Status   Specimen Description BLOOD LEFT ANTECUBITAL  Final   Special Requests   Final    BOTTLES DRAWN AEROBIC AND ANAEROBIC Blood Culture adequate volume   Culture   Final    NO GROWTH < 24 HOURS Performed at [facility], [place] [address] [place], [place] [address]    Report Status PENDING  Incomplete  SARS Coronavirus 2 by RT PCR (hospital order, performed in [facility] hospital lab) Nasopharyngeal Nasopharyngeal Swab     Status: None   Collection Time: 01/20/20  2:56 PM   Specimen: Nasopharyngeal Swab  Result Value Ref Range Status   SARS Coronavirus 2 NEGATIVE NEGATIVE Final    Comment: (NOTE) SARS-CoV-2 target nucleic acids are NOT DETECTED. The SARS-CoV-2 RNA is generally detectable in upper and lower respiratory specimens during the acute phase of infection. The lowest concentration of SARS-CoV-2 viral copies  this assay can detect is 250 copies / mL. A negative result does not preclude SARS-CoV-2 infection and should not be used as the sole basis for treatment or other patient management decisions.  A negative result may occur with improper specimen collection / handling, submission of specimen other than nasopharyngeal swab, presence of viral  mutation(s) within the areas targeted by this assay, and inadequate number of viral copies (<250 copies / mL). A negative result must be combined with clinical observations, patient history, and epidemiological information. Fact Sheet for Patients:   StrictlyIdeas.no Fact Sheet for Healthcare Providers: BankingDealers.co.za This test is not yet approved or cleared  by the [place] FDA and has been authorized for detection and/or diagnosis of SARS-CoV-2 by FDA under an Emergency Use Authorization (EUA).  This EUA will remain in effect (meaning this test can be used) for the duration of the COVID-19 declaration under Section 564(b)(1) of the Act, 21 U.S.C. section 360bbb-3(b)(1), unless the authorization is terminated or revoked sooner. Performed at [facility], [place] [address], [place], [place] [address]   Urine culture     Status: None   Collection Time: 01/20/20  3:22 PM   Specimen: In/Out Cath Urine  Result Value Ref Range Status   Specimen Description IN/OUT CATH URINE  Final   Special Requests NONE  Final   Culture   Final    NO GROWTH Performed at [facility], [place] [address] [place], [place] [address]    Report Status 01/21/2020 FINAL  Final  Respiratory Panel by PCR     Status: None   Collection Time: 01/21/20  4:27 AM   Specimen: Nasopharyngeal Swab; Respiratory  Result Value Ref Range Status   Adenovirus NOT DETECTED NOT DETECTED Final   Coronavirus 229E NOT DETECTED NOT DETECTED Final    Comment: (NOTE) The Coronavirus on the Respiratory Panel, DOES NOT test for the  novel  Coronavirus (2019 nCoV)    Coronavirus HKU1 NOT DETECTED NOT DETECTED Final   Coronavirus NL63 NOT DETECTED NOT DETECTED Final   Coronavirus OC43 NOT DETECTED NOT DETECTED Final   Metapneumovirus NOT DETECTED NOT DETECTED Final   Rhinovirus / Enterovirus NOT DETECTED NOT DETECTED Final   Influenza A NOT DETECTED NOT DETECTED Final   Influenza B NOT DETECTED NOT DETECTED Final   Parainfluenza Virus 1 NOT DETECTED NOT DETECTED Final   Parainfluenza Virus 2 NOT DETECTED NOT DETECTED Final   Parainfluenza Virus 3 NOT DETECTED NOT DETECTED Final   Parainfluenza Virus 4 NOT DETECTED NOT DETECTED Final   Respiratory Syncytial Virus NOT DETECTED NOT DETECTED Final   Bordetella pertussis NOT DETECTED NOT DETECTED Final   Chlamydophila pneumoniae NOT DETECTED NOT DETECTED Final   Mycoplasma pneumoniae NOT DETECTED NOT DETECTED Final    Comment: Performed at [facility], [place], [place], [place] [address]    RADIOLOGY STUDIES/RESULTS: CT HEAD WO CONTRAST  Result Date: 01/20/2020 CLINICAL DATA:  Confused, possible fall EXAM: CT HEAD WITHOUT CONTRAST TECHNIQUE: Contiguous axial images were obtained from the base of the skull through the vertex without intravenous contrast. COMPARISON:  09/15/2019 FINDINGS: Brain: There is no acute intracranial hemorrhage, mass effect, or edema. Gray-white differentiation is preserved. There is no extra-axial fluid collection. Ventricles and sulci are stable in size and configuration. Patchy hypoattenuation in the supratentorial white matter is nonspecific but probably reflects stable chronic microvascular ischemic changes. Vascular: There is mild atherosclerotic calcification at the skull base. Skull: Calvarium is unremarkable. Sinuses/Orbits: Chronic lobular paranasal sinus mucosal thickening with bilateral maxillary sinus air-fluid levels. No acute orbital finding. Other: None. IMPRESSION: No acute intracranial abnormality. Chronic diffuse  paranasal sinus inflammatory changes. There are nonspecific air-fluid levels within the maxillary sinuses, which can reflect acute sinusitis in the appropriate clinical setting. Electronically Signed   By: Macy Mis  M.D.   On: 01/20/2020 14:43   DG Chest Port 1 View  Result Date: 01/20/2020 CLINICAL DATA:  Altered mental status EXAM: PORTABLE CHEST 1 VIEW COMPARISON:  09/16/2019 FINDINGS: The heart size and mediastinal contours are within normal limits. Both lungs are clear. The visualized skeletal structures are unremarkable. IMPRESSION: No active disease. Electronically Signed   By: Franchot Gallo M.D.   On: 01/20/2020 14:49     LOS: 0 days   Oren Binet, MD  [facility]  To contact the attending provider between 7A-7P or the covering provider during after hours 7P-7A, please log into the web site [email or web address] and access using universal [place] password for that web site. If you do not have the password, please call the hospital operator.  01/21/2020, 2:06 PM

## 2020-01-22 ENCOUNTER — Inpatient Hospital Stay (HOSPITAL_COMMUNITY): Payer: Medicare PPO

## 2020-01-22 DIAGNOSIS — R7989 Other specified abnormal findings of blood chemistry: Secondary | ICD-10-CM

## 2020-01-22 LAB — CBC
HCT: 36.7 % (ref 36.0–46.0)
Hemoglobin: 11.6 g/dL — ABNORMAL LOW (ref 12.0–15.0)
MCH: 26.1 pg (ref 26.0–34.0)
MCHC: 31.6 g/dL (ref 30.0–36.0)
MCV: 82.5 fL (ref 80.0–100.0)
Platelets: 233 10*3/uL (ref 150–400)
RBC: 4.45 MIL/uL (ref 3.87–5.11)
RDW: 15.6 % — ABNORMAL HIGH (ref 11.5–15.5)
WBC: 8.1 10*3/uL (ref 4.0–10.5)
nRBC: 0 % (ref 0.0–0.2)

## 2020-01-22 LAB — COMPREHENSIVE METABOLIC PANEL
ALT: 12 U/L (ref 0–44)
AST: 14 U/L — ABNORMAL LOW (ref 15–41)
Albumin: 3.6 g/dL (ref 3.5–5.0)
Alkaline Phosphatase: 63 U/L (ref 38–126)
Anion gap: 9 (ref 5–15)
BUN: 20 mg/dL (ref 8–23)
CO2: 24 mmol/L (ref 22–32)
Calcium: 9 mg/dL (ref 8.9–10.3)
Chloride: 106 mmol/L (ref 98–111)
Creatinine, Ser: 1.16 mg/dL — ABNORMAL HIGH (ref 0.44–1.00)
GFR calc Af Amer: 54 mL/min — ABNORMAL LOW (ref >60)
GFR calc non Af Amer: 46 mL/min — ABNORMAL LOW (ref >60)
Glucose, Bld: 107 mg/dL — ABNORMAL HIGH (ref 70–99)
Potassium: 3.6 mmol/L (ref 3.5–5.1)
Sodium: 139 mmol/L (ref 135–145)
Total Bilirubin: 1 mg/dL (ref 0.3–1.2)
Total Protein: 6.6 g/dL (ref 6.5–8.1)

## 2020-01-22 LAB — LACTATE DEHYDROGENASE: LDH: 195 U/L — ABNORMAL HIGH (ref 98–192)

## 2020-01-22 LAB — MAGNESIUM: Magnesium: 1.8 mg/dL (ref 1.7–2.4)

## 2020-01-22 LAB — C-REACTIVE PROTEIN: CRP: <0.5 mg/dL (ref <1.0)

## 2020-01-22 LAB — BRAIN NATRIURETIC PEPTIDE: B Natriuretic Peptide: 165.3 pg/mL — ABNORMAL HIGH (ref 0.0–100.0)

## 2020-01-22 LAB — D-DIMER, QUANTITATIVE: D-Dimer, Quant: 2.3 ug/mL-FEU — ABNORMAL HIGH (ref 0.00–0.50)

## 2020-01-22 LAB — CORTISOL: Cortisol, Plasma: 18.7 ug/dL

## 2020-01-22 LAB — PROCALCITONIN: Procalcitonin: <0.1 ng/mL

## 2020-01-22 LAB — TSH: TSH: 2.155 u[IU]/mL (ref 0.350–4.500)

## 2020-01-22 MED ORDER — HYDRALAZINE HCL 20 MG/ML IJ SOLN: 10.0000 mg | Freq: Four times a day (QID) | INTRAMUSCULAR | Status: DC | PRN

## 2020-01-22 NOTE — Evaluation (Signed)
Physical Therapy Evaluation Patient Details Name: Sherry Kent MRN: [id number] DOB: 31-May-1946 Today's Date: 01/22/2020   History of Present Illness  74 y.o. female with history of HTN, HLD, anxiety who presented to the ED with confusion-was found to be febrile.  Clinical Impression  PT eval complete. Pt is independent with all functional mobility. No strength or balance deficits noted. She reports she feels back to her baseline. No further PT intervention indicated. PT signing off.    Follow Up Recommendations No PT follow up    Equipment Recommendations  None recommended by PT    Recommendations for Other Services       Precautions / Restrictions Precautions Precautions: None      Mobility  Bed Mobility Overal bed mobility: Independent                Transfers Overall transfer level: Independent                  Ambulation/Gait Ambulation/Gait assistance: Independent Gait Distance (Feet): 350 Feet Assistive device: None Gait Pattern/deviations: WFL(Within Functional Limits) Gait velocity: WFL Gait velocity interpretation: >2.62 ft/sec, indicative of community ambulatory General Gait Details: steady gait  Stairs            Wheelchair Mobility    Modified Rankin (Stroke Patients Only)       Balance Overall balance assessment: Independent                                           Pertinent Vitals/Pain Pain Assessment: No/denies pain    Home Living Family/patient expects to be discharged to:: Private residence Living Arrangements: Alone Available Help at Discharge: Family;Available 24 hours/day Type of Home: Apartment Home Access: Level entry     Home Layout: One level Home Equipment: None 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[employer]    Prior Function Level of Independence: Independent               Journalist, newspaper   Dominant Hand: Right    Extremity/Trunk Assessment   Upper Extremity  Assessment Upper Extremity Assessment: Overall WFL for tasks assessed    Lower Extremity Assessment Lower Extremity Assessment: Overall WFL for tasks assessed    Cervical / Trunk Assessment Cervical / Trunk Assessment: Normal  Communication   Communication: No difficulties  Cognition Arousal/Alertness: Awake/alert Behavior During Therapy: WFL for tasks assessed/performed Overall Cognitive Status: Within Functional Limits for tasks assessed                                        General Comments General comments (skin integrity, edema, etc.): VSS    Exercises     Assessment/Plan    PT Assessment Patent does not need any further PT services  PT Problem List         PT Treatment Interventions      PT Goals (Current goals can be found in the Care Plan section)  Acute Rehab PT Goals Patient Stated Goal: home PT Goal Formulation: All assessment and education complete, DC therapy    Frequency     Barriers to discharge        Co-evaluation               AM-PAC PT "6 Clicks" Mobility  Outcome Measure Help needed turning  from your back to your side while in a flat bed without using bedrails?: None Help needed moving from lying on your back to sitting on the side of a flat bed without using bedrails?: None Help needed moving to and from a bed to a chair (including a wheelchair)?: None Help needed standing up from a chair using your arms (e.g., wheelchair or bedside chair)?: None Help needed to walk in hospital room?: None Help needed climbing 3-5 steps with a railing? : None 6 Click Score: 24    End of Session   Activity Tolerance: Patient tolerated treatment well Patient left: in bed;with call bell/phone within reach Nurse Communication: Mobility status PT Visit Diagnosis: Difficulty in walking, not elsewhere classified (R26.2)    Time: [id number] PT Time Calculation (min) (ACUTE ONLY): 11 min   Charges:   PT Evaluation $PT Eval Low  Complexity: 1 Low          Lorrin Goodell, PT  Office # [phone] Pager [phone]   Lorriane Shire 01/22/2020, 12:29 PM

## 2020-01-22 NOTE — Progress Notes (Addendum)
PROGRESS NOTE        PATIENT DETAILS Name: Sherry Kent Age: 74 y.o. Sex: female Date of Birth: May 28, 1946 Admit Date: 01/20/2020 Admitting Physician Evalee Mutton Kristeen Mans, MD [id number], Modena Nunnery, MD  Brief Narrative: Patient is a 74 y.o. female with history of HTN, HLD, anxiety who presented to the ED with confusion-was found to be febrile-she was started on broad-spectrum antimicrobial therapy and subsequently admitted [facility] for further eval and treatment.  Interestingly-patient had a similar but a more severe presentation in January 2021-required intubation for airway protection-ICU admission-ultimately thought to be secondary to UTI.  Had extensive work-up including EEG (short encephalopathy) and spinal tap (CSF culture neg, HSV 1/HSV 2 PCR neg).  Significant events: 5/20>> admit with fever and acute metabolic encephalopathy. [id number] back to baseline-completely awake and alert  Antimicrobial therapy: Cefepime: 5/20>> Flagyl: 5/20 x1 Vancomycin: 5/20>> 5/21  Microbiology data: 5/20: Blood culture>> no growth 5/20: Urine culture>> pending 5/20: Respiratory virus panel>> negative 5/20:SARS Coronavirus 2>>negative  Significant studies: 5/20: CT head without contrast>> no acute abnormalities 5/20: Chest x-ray>> no active disease  Procedures : None  Consults: None  DVT Prophylaxis : Prophylactic Lovenox   Subjective:  Patient in bed, appears comfortable, denies any headache, no fever, no chest pain or pressure, no shortness of breath , no abdominal pain. No focal weakness.   Assessment/Plan:  Fever with acute metabolic encephalopathy: He had a similar admission in January 2021 with she was intubated because of encephalopathy due to UTI, this admission no clear focus of infection.  UA unremarkable, chest x-ray stable, MRI brain unremarkable, mentation back to baseline after empiric antibiotics.  Urine and blood cultures  so far negative, procalcitonin stable, continue empiric cephalosporin and continue to monitor.  Of note UDS does show some cannabinoids.  CKD stage IIIb: Creatinine close to baseline  Proteinuria: Unknown etiology-has been ongoing for the past several years-since not a acute issue-stable for outpatient follow-up.  HTN: On Norvasc and bisoprolol, added as needed hydralazine.  HLD: Resume statin  Marijuana abuse.  Counseled to quit.    Evaded D-dimer.  Check lower extremity venous duplex.    Morbid Obesity: Estimated body mass index is 33.08 kg/m as calculated from the following:   Height as of this encounter: 5\' 3"  (1.6 m).   Weight as of this encounter: 84.7 kg.    Diet: Diet Order            Diet Heart Room service appropriate? Yes; Fluid consistency: Thin  Diet effective now               Code Status: Full code   Family Communication: Nephew over the [id number]  Disposition Plan: Status is: Inpatient  Remains inpatient appropriate because:Inpatient level of care appropriate due to severity of illness   Dispo: The patient is from: Home              Anticipated d/c is to: Home              Anticipated d/c date is: 2 days              Patient currently is not medically stable to d/c.   Barriers to Discharge: Fever with acute encephalopathy-unknown etiology-remains on IV antibiotics-not appropriate to transition to oral antimicrobial therapy.  Antimicrobial agents: Anti-infectives (From admission, onward)   Start  Dose/Rate Route Frequency Ordered Stop   01/21/20 1630  vancomycin (VANCOCIN) IVPB 1000 mg/200 mL premix  Status:  Discontinued     1,000 mg 200 mL/hr over 60 Minutes Intravenous Every 24 hours 01/20/20 1528 01/21/20 1440   01/20/20 1530  ceFEPIme (MAXIPIME) 2 g in sodium chloride 0.9 % 100 mL IVPB     2 g 200 mL/hr over 30 Minutes Intravenous Every 12 hours 01/20/20 1523     01/20/20 1530  metroNIDAZOLE (FLAGYL) IVPB 500 mg     500  mg 100 mL/hr over 60 Minutes Intravenous  Once 01/20/20 1523 01/20/20 1638   01/20/20 1530  vancomycin (VANCOCIN) IVPB 1000 mg/200 mL premix  Status:  Discontinued     1,000 mg 200 mL/hr over 60 Minutes Intravenous  Once 01/20/20 1523 01/20/20 1528   01/20/20 1530  vancomycin (VANCOREADY) IVPB 1500 mg/300 mL     1,500 mg 150 mL/hr over 120 Minutes Intravenous  Once 01/20/20 1528 01/20/20 2056       Time spent: 25 minutes-Greater than 50% of this time was spent in counseling, explanation of diagnosis, planning of further management, and coordination of care.  MEDICATIONS: Scheduled Meds: . amLODipine  10 mg Oral Daily  . bisoprolol  10 mg Oral Daily  . enoxaparin (LOVENOX) injection  40 mg Subcutaneous Q24H  . rosuvastatin  20 mg Oral Daily  . sodium chloride flush  3 mL Intravenous Once  . sodium chloride flush  3 mL Intravenous Q12H   Continuous Infusions: . sodium chloride Stopped (01/21/20 1900)  . ceFEPime (MAXIPIME) IV 2 g (01/22/20 0314)   PRN Meds:.acetaminophen **OR** [DISCONTINUED] acetaminophen, albuterol, [DISCONTINUED] ondansetron **OR** ondansetron (ZOFRAN) IV   PHYSICAL EXAM: Vital signs: Vitals:   01/22/20 0405 01/22/20 0743 01/22/20 1011 01/22/20 1156  BP:  (!) 166/76 (!) 165/85 (!) 160/72  Pulse:  70 82 64  Resp:  18 16 14   Temp: 98.4 F (36.9 C) (!) 97.5 F (36.4 C) 97.9 F (36.6 C) 97.9 F (36.6 C)  TempSrc: Oral Oral Oral Oral  SpO2:  99%  93%  Weight:      Height:       Filed Weights   01/20/20 1413 01/20/20 2056  Weight: 72.6 kg 84.7 kg   Body mass index is 33.08 kg/m.   Gen Exam:  Awake Alert, No new F.N deficits, Normal affect [place].AT,PERRAL Supple Neck,No JVD, No cervical lymphadenopathy appriciated.  Symmetrical Chest wall movement, Good air movement bilaterally, CTAB RRR,No Gallops, Rubs or new Murmurs, No Parasternal Heave +ve B.Sounds, Abd Soft, No tenderness, No organomegaly appriciated, No rebound - guarding or rigidity. No  Cyanosis, Clubbing or edema, No new Rash or bruise  I have personally reviewed following labs and imaging studies  LABORATORY DATA: CBC: Recent Labs  Lab 01/20/20 1411 01/20/20 1436 01/21/20 0321 01/22/20 0626  WBC 13.7*  --  14.4* 8.1  NEUTROABS 11.6*  --   --   --   HGB 13.6 13.9 12.3 11.6*  HCT 42.6 41.0 38.5 36.7  MCV 81.0  --  81.2 82.5  PLT 309  --  268 0000000    Basic Metabolic Panel: Recent Labs  Lab 01/20/20 1411 01/20/20 1436 01/21/20 0321 01/22/20 0626 01/22/20 0918  NA 138 138 139 139  --   K 4.3 4.0 3.7 3.6  --   CL 100 103 105 106  --   CO2 23  --  23 24  --   GLUCOSE 204* 202* 116* 107*  --  BUN 23 24* 16 20  --   CREATININE 1.32* 1.20* 1.07* 1.16*  --   CALCIUM 9.9  --  9.2 9.0  --   MG  --   --   --   --  1.8    GFR: Estimated Creatinine Clearance: 43.9 mL/Sherry (A) (by C-G formula based on SCr of 1.16 mg/dL (H)).  Liver Function Tests: Recent Labs  Lab 01/20/20 1411 01/22/20 0626  AST 22 14*  ALT 19 12  ALKPHOS 92 63  BILITOT 1.0 1.0  PROT 8.2* 6.6  ALBUMIN 4.5 3.6   No results for input(s): LIPASE, AMYLASE in the last 168 hours. No results for input(s): AMMONIA in the last 168 hours.  Coagulation Profile: Recent Labs  Lab 01/20/20 1411  INR 1.0    Cardiac Enzymes: No results for input(s): CKTOTAL, CKMB, CKMBINDEX, TROPONINI in the last 168 hours.  BNP (last 3 results) No results for input(s): PROBNP in the last 8760 hours.  Lipid Profile: No results for input(s): CHOL, HDL, LDLCALC, TRIG, CHOLHDL, LDLDIRECT in the last 72 hours.  Thyroid Function Tests: Recent Labs    01/22/20 0918  TSH 2.155    Anemia Panel: No results for input(s): VITAMINB12, FOLATE, FERRITIN, TIBC, IRON, RETICCTPCT in the last 72 hours.  Urine analysis:    Component Value Date/Time   COLORURINE YELLOW 01/20/2020 1522   APPEARANCEUR CLEAR 01/20/2020 1522   LABSPEC 1.024 01/20/2020 1522   PHURINE 7.0 01/20/2020 1522   GLUCOSEU NEGATIVE  01/20/2020 1522   HGBUR SMALL (A) 01/20/2020 1522   BILIRUBINUR NEGATIVE 01/20/2020 1522   KETONESUR NEGATIVE 01/20/2020 1522   PROTEINUR >=300 (A) 01/20/2020 1522   UROBILINOGEN 0.2 12/11/2011 1102   NITRITE NEGATIVE 01/20/2020 1522   LEUKOCYTESUR NEGATIVE 01/20/2020 1522    Sepsis Labs: Lactic Acid, Venous    Component Value Date/Time   LATICACIDVEN 1.5 01/20/2020 1758    MICROBIOLOGY: Recent Results (from the past 240 hour(s))  Blood Culture (routine x 2)     Status: None (Preliminary result)   Collection Time: 01/20/20  2:31 PM   Specimen: BLOOD  Result Value Ref Range Status   Specimen Description BLOOD RIGHT ANTECUBITAL  Final   Special Requests   Final    BOTTLES DRAWN AEROBIC AND ANAEROBIC Blood Culture adequate volume   Culture   Final    NO GROWTH 2 DAYS Performed at [facility], [place] [address] [place], [place] [address]    Report Status PENDING  Incomplete  Blood Culture (routine x 2)     Status: None (Preliminary result)   Collection Time: 01/20/20  2:38 PM   Specimen: BLOOD  Result Value Ref Range Status   Specimen Description BLOOD LEFT ANTECUBITAL  Final   Special Requests   Final    BOTTLES DRAWN AEROBIC AND ANAEROBIC Blood Culture adequate volume   Culture   Final    NO GROWTH 2 DAYS Performed at [facility], [place] [address], [place], [place] [address]    Report Status PENDING  Incomplete  SARS Coronavirus 2 by RT PCR (hospital order, performed in [facility] hospital lab) Nasopharyngeal Nasopharyngeal Swab     Status: None   Collection Time: 01/20/20  2:56 PM   Specimen: Nasopharyngeal Swab  Result Value Ref Range Status   SARS Coronavirus 2 NEGATIVE NEGATIVE Final    Comment: (NOTE) SARS-CoV-2 target nucleic acids are NOT DETECTED. The SARS-CoV-2 RNA is generally detectable in upper and lower respiratory specimens during the acute  phase of infection. The lowest concentration of SARS-CoV-2 viral copies this assay can detect is  250 copies / mL. A negative result does not preclude SARS-CoV-2 infection and should not be used as the sole basis for treatment or other patient management decisions.  A negative result may occur with improper specimen collection / handling, submission of specimen other than nasopharyngeal swab, presence of viral mutation(s) within the areas targeted by this assay, and inadequate number of viral copies (<250 copies / mL). A negative result must be combined with clinical observations, patient history, and epidemiological information. Fact Sheet for Patients:   StrictlyIdeas.no Fact Sheet for Healthcare Providers: BankingDealers.co.za This test is not yet approved or cleared  by the [place] FDA and has been authorized for detection and/or diagnosis of SARS-CoV-2 by FDA under an Emergency Use Authorization (EUA).  This EUA will remain in effect (meaning this test can be used) for the duration of the COVID-19 declaration under Section 564(b)(1) of the Act, 21 U.S.C. section 360bbb-3(b)(1), unless the authorization is terminated or revoked sooner. Performed at [facility], [place] [address], [place], [place] [address]   Urine culture     Status: None   Collection Time: 01/20/20  3:22 PM   Specimen: In/Out Cath Urine  Result Value Ref Range Status   Specimen Description IN/OUT CATH URINE  Final   Special Requests NONE  Final   Culture   Final    NO GROWTH Performed at [facility], [place] [address], [place], [place] [address]    Report Status 01/21/2020 FINAL  Final  Respiratory Panel by PCR     Status: None   Collection Time: 01/21/20  4:27 AM   Specimen: Nasopharyngeal Swab; Respiratory  Result Value Ref Range Status   Adenovirus NOT DETECTED NOT DETECTED Final   Coronavirus 229E NOT DETECTED NOT DETECTED Final    Comment: (NOTE) The Coronavirus on the Respiratory Panel, DOES NOT test for the novel  Coronavirus (2019  nCoV)    Coronavirus HKU1 NOT DETECTED NOT DETECTED Final   Coronavirus NL63 NOT DETECTED NOT DETECTED Final   Coronavirus OC43 NOT DETECTED NOT DETECTED Final   Metapneumovirus NOT DETECTED NOT DETECTED Final   Rhinovirus / Enterovirus NOT DETECTED NOT DETECTED Final   Influenza A NOT DETECTED NOT DETECTED Final   Influenza B NOT DETECTED NOT DETECTED Final   Parainfluenza Virus 1 NOT DETECTED NOT DETECTED Final   Parainfluenza Virus 2 NOT DETECTED NOT DETECTED Final   Parainfluenza Virus 3 NOT DETECTED NOT DETECTED Final   Parainfluenza Virus 4 NOT DETECTED NOT DETECTED Final   Respiratory Syncytial Virus NOT DETECTED NOT DETECTED Final   Bordetella pertussis NOT DETECTED NOT DETECTED Final   Chlamydophila pneumoniae NOT DETECTED NOT DETECTED Final   Mycoplasma pneumoniae NOT DETECTED NOT DETECTED Final    Comment: Performed at [facility], [place]. [address] [place], [place] [address]    RADIOLOGY STUDIES/RESULTS: CT HEAD WO CONTRAST  Result Date: 01/20/2020 CLINICAL DATA:  Confused, possible fall EXAM: CT HEAD WITHOUT CONTRAST TECHNIQUE: Contiguous axial images were obtained from the base of the skull through the vertex without intravenous contrast. COMPARISON:  09/15/2019 FINDINGS: Brain: There is no acute intracranial hemorrhage, mass effect, or edema. Gray-white differentiation is preserved. There is no extra-axial fluid collection. Ventricles and sulci are stable in size and configuration. Patchy hypoattenuation in the supratentorial white matter is nonspecific but probably reflects stable chronic microvascular ischemic changes. Vascular: There is mild atherosclerotic calcification  at the skull base. Skull: Calvarium is unremarkable. Sinuses/Orbits: Chronic lobular paranasal sinus mucosal thickening with bilateral maxillary sinus air-fluid levels. No acute orbital finding. Other: None. IMPRESSION: No acute intracranial abnormality. Chronic diffuse paranasal sinus inflammatory  changes. There are nonspecific air-fluid levels within the maxillary sinuses, which can reflect acute sinusitis in the appropriate clinical setting. Electronically Signed   By: Macy Mis M.D.   On: 01/20/2020 14:43   MR BRAIN WO CONTRAST  Result Date: 01/21/2020 CLINICAL DATA:  74 year old female with confusion. Encephalopathy. EXAM: MRI HEAD WITHOUT CONTRAST TECHNIQUE: Multiplanar, multiecho pulse sequences of the brain and surrounding structures were obtained without intravenous contrast. COMPARISON:  Head CT 01/20/2020. Brain MRI 12/11/2011. FINDINGS: Brain: Cerebral volume is within normal limits for age. No restricted diffusion to suggest acute infarction. No midline shift, mass effect, evidence of mass lesion, ventriculomegaly, extra-axial collection or acute intracranial hemorrhage. Cervicomedullary junction and pituitary are within normal limits. Widely scattered bilateral cerebral white matter T2 and FLAIR hyperintensity appears only mildly progressed since 2013. Configuration is nonspecific as before. No cortical encephalomalacia or chronic cerebral blood products identified. Small chronic infarct now suspected in the right thalamus, new since 2013 (series 6, image 13). Otherwise the deep gray matter nuclei, brainstem and cerebellum remain normal. Vascular: Major intracranial vascular flow voids are stable since 2013. Skull and upper cervical spine: Normal for age visible cervical spine and bone marrow signal. Sinuses/Orbits: Chronic postoperative changes to the globes. Bilateral paranasal sinus inflammation, chronic but increased compared to 2013. Bilateral maxillary sinus fluid levels now, and more sphenoid sinus involvement than before. Other: But bilateral mastoid air cells remain clear. Visible internal auditory structures appear normal. Scalp and face soft tissues appear negative. IMPRESSION: 1. No acute intracranial abnormality. 2. Mild progression of chronic white matter disease since  2013. And a small chronic right thalamic lacune is new since that time. 3. Acute on chronic paranasal sinus disease. Electronically Signed   By: Genevie Ann M.D.   On: 01/21/2020 18:06   DG Chest Port 1 View  Result Date: 01/20/2020 CLINICAL DATA:  Altered mental status EXAM: PORTABLE CHEST 1 VIEW COMPARISON:  09/16/2019 FINDINGS: The heart size and mediastinal contours are within normal limits. Both lungs are clear. The visualized skeletal structures are unremarkable. IMPRESSION: No active disease. Electronically Signed   By: Franchot Gallo M.D.   On: 01/20/2020 14:49     LOS: 1 day   Lala Lund, MD  [facility]  To contact the attending provider between 7A-7P or the covering provider during after hours 7P-7A, please log into the web site [email or web address] and access using universal [place] password for that web site. If you do not have the password, please call the hospital operator.  01/22/2020, 1:58 PM

## 2020-01-23 ENCOUNTER — Inpatient Hospital Stay (HOSPITAL_COMMUNITY): Payer: Medicare PPO

## 2020-01-23 LAB — CBC WITH DIFFERENTIAL/PLATELET
Abs Immature Granulocytes: 0.06 10*3/uL (ref 0.00–0.07)
Basophils Absolute: 0.1 10*3/uL (ref 0.0–0.1)
Basophils Relative: 1 %
Eosinophils Absolute: 0.3 10*3/uL (ref 0.0–0.5)
Eosinophils Relative: 3 %
HCT: 40.4 % (ref 36.0–46.0)
Hemoglobin: 13 g/dL (ref 12.0–15.0)
Immature Granulocytes: 1 %
Lymphocytes Relative: 27 %
Lymphs Abs: 2.5 10*3/uL (ref 0.7–4.0)
MCH: 26.4 pg (ref 26.0–34.0)
MCHC: 32.2 g/dL (ref 30.0–36.0)
MCV: 82.1 fL (ref 80.0–100.0)
Monocytes Absolute: 0.7 10*3/uL (ref 0.1–1.0)
Monocytes Relative: 8 %
Neutro Abs: 5.6 10*3/uL (ref 1.7–7.7)
Neutrophils Relative %: 60 %
Platelets: 253 10*3/uL (ref 150–400)
RBC: 4.92 MIL/uL (ref 3.87–5.11)
RDW: 15.5 % (ref 11.5–15.5)
WBC: 9.2 10*3/uL (ref 4.0–10.5)
nRBC: 0 % (ref 0.0–0.2)

## 2020-01-23 LAB — BRAIN NATRIURETIC PEPTIDE: B Natriuretic Peptide: 112.6 pg/mL — ABNORMAL HIGH (ref 0.0–100.0)

## 2020-01-23 LAB — D-DIMER, QUANTITATIVE: D-Dimer, Quant: 2.24 ug/mL-FEU — ABNORMAL HIGH (ref 0.00–0.50)

## 2020-01-23 LAB — COMPREHENSIVE METABOLIC PANEL
ALT: 15 U/L (ref 0–44)
AST: 16 U/L (ref 15–41)
Albumin: 3.8 g/dL (ref 3.5–5.0)
Alkaline Phosphatase: 65 U/L (ref 38–126)
Anion gap: 10 (ref 5–15)
BUN: 18 mg/dL (ref 8–23)
CO2: 26 mmol/L (ref 22–32)
Calcium: 9.3 mg/dL (ref 8.9–10.3)
Chloride: 103 mmol/L (ref 98–111)
Creatinine, Ser: 1.03 mg/dL — ABNORMAL HIGH (ref 0.44–1.00)
GFR calc Af Amer: >60 mL/min (ref >60)
GFR calc non Af Amer: 54 mL/min — ABNORMAL LOW (ref >60)
Glucose, Bld: 107 mg/dL — ABNORMAL HIGH (ref 70–99)
Potassium: 4.8 mmol/L (ref 3.5–5.1)
Sodium: 139 mmol/L (ref 135–145)
Total Bilirubin: 1.3 mg/dL — ABNORMAL HIGH (ref 0.3–1.2)
Total Protein: 7.2 g/dL (ref 6.5–8.1)

## 2020-01-23 LAB — MAGNESIUM: Magnesium: 2 mg/dL (ref 1.7–2.4)

## 2020-01-23 LAB — C-REACTIVE PROTEIN: CRP: 0.5 mg/dL (ref <1.0)

## 2020-01-23 LAB — PROCALCITONIN: Procalcitonin: <0.1 ng/mL

## 2020-01-23 MED ORDER — IOHEXOL 9 MG/ML PO SOLN
500.0000 mL | ORAL | Status: AC
  Administered 2020-01-23 (×2): 500 mL via ORAL

## 2020-01-23 MED ORDER — SODIUM CHLORIDE 0.9 % IV SOLN
1.0000 g | INTRAVENOUS | Status: DC
  Administered 2020-01-23 – 2020-01-24 (×2): 1 g via INTRAVENOUS
  Filled 2020-01-23 (×2): qty 10

## 2020-01-23 MED ORDER — IOHEXOL 300 MG/ML  SOLN
100.0000 mL | Freq: Once | INTRAMUSCULAR | Status: AC | PRN
  Administered 2020-01-23: 100 mL via INTRAVENOUS

## 2020-01-23 MED ORDER — SODIUM CHLORIDE 0.9 % IV SOLN: INTRAVENOUS | Status: AC

## 2020-01-23 MED ORDER — HYDRALAZINE HCL 50 MG PO TABS
50.0000 mg | ORAL_TABLET | Freq: Three times a day (TID) | ORAL | Status: DC
  Administered 2020-01-23 – 2020-01-24 (×3): 50 mg via ORAL
  Filled 2020-01-23 (×3): qty 1

## 2020-01-23 NOTE — Progress Notes (Signed)
Occupational Therapy Evaluation Patient Details Name: Sherry Kent MRN: [id number] DOB: 1946/07/02 Today's Date: 01/23/2020    History of Present Illness 74 y.o. female with history of HTN, HLD, anxiety who presented to the ED with confusion-was found to be febrile.   Clinical Impression   Patient lives at home alone and is independent at prior level.  Today patient is independent with all ADLs and mobility.  Demonstrated normal balance and cognition.  Keeping patient on OT case load for one more visit if necessary for activity tolerance and energy conservation strategies as patient was fatigued.     Follow Up Recommendations  No OT follow up    Equipment Recommendations  None recommended by OT    Recommendations for Other Services       Precautions / Restrictions Precautions Precautions: None Restrictions Weight Bearing Restrictions: No      Mobility Bed Mobility Overal bed mobility: Independent                Transfers Overall transfer level: Independent                    Balance Overall balance assessment: Independent                                         ADL either performed or assessed with clinical judgement   ADL Overall ADL's : Independent                                       General ADL Comments: Patient independent with all ADLs at this time     Vision         Perception     Praxis      Pertinent Vitals/Pain Pain Assessment: No/denies pain     Hand Dominance Right   Extremity/Trunk Assessment Upper Extremity Assessment Upper Extremity Assessment: Overall WFL for tasks assessed           Communication Communication Communication: No difficulties   Cognition Arousal/Alertness: Awake/alert Behavior During Therapy: WFL for tasks assessed/performed Overall Cognitive Status: Within Functional Limits for tasks assessed                                     General  Comments       Exercises     Shoulder Instructions      Home Living Family/patient expects to be discharged to:: Private residence Living Arrangements: Alone Available Help at Discharge: Family;Available 24 hours/day Type of Home: Apartment Home Access: Level entry     Home Layout: One level     Bathroom Shower/Tub: Teacher, early years/pre: Standard     Home Equipment: None   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[employer]      Prior Functioning/Environment Level of Independence: Independent                 OT Problem List: Decreased activity tolerance      OT Treatment/Interventions: Self-care/ADL training;Therapeutic exercise;Energy conservation;Patient/family education    OT Goals(Current goals can be found in the care plan section) Acute Rehab OT Goals Patient Stated Goal: home OT Goal Formulation: With patient Time For Goal Achievement: 02/07/20 Potential to Achieve Goals: Good  OT  Frequency: Min 1X/week   Barriers to D/C:            Co-evaluation              AM-PAC OT "6 Clicks" Daily Activity     Outcome Measure Help from another person eating meals?: None Help from another person taking care of personal grooming?: None Help from another person toileting, which includes using toliet, bedpan, or urinal?: None Help from another person bathing (including washing, rinsing, drying)?: None Help from another person to put on and taking off regular upper body clothing?: None Help from another person to put on and taking off regular lower body clothing?: None 6 Click Score: 24   End of Session Nurse Communication: Mobility status  Activity Tolerance: Patient tolerated treatment well Patient left: in bed;with call bell/phone within reach  OT Visit Diagnosis: Other (comment)(Decreased activity toleranc)                Time: [id number] OT Time Calculation (min): 13 min Charges:  OT General Charges $OT Visit: 1 Visit OT  Evaluation $OT Eval Moderate Complexity: 1 Mod  August Luz, OTR/L   Phylliss Bob 01/23/2020, 3:32 PM

## 2020-01-23 NOTE — Progress Notes (Signed)
PROGRESS NOTE        PATIENT DETAILS Name: Sherry Kent Age: 74 y.o. Sex: female Date of Birth: November 12, 1945 Admit Date: 01/20/2020 Admitting Physician Evalee Mutton Kristeen Mans, MD [id number], Modena Nunnery, MD  Brief Narrative: Patient is a 74 y.o. female with history of HTN, HLD, anxiety who presented to the ED with confusion-was found to be febrile-she was started on broad-spectrum antimicrobial therapy and subsequently admitted to the [facility] for further eval and treatment.  Interestingly-patient had a similar but a more severe presentation in January 2021-required intubation for airway protection-ICU admission-ultimately thought to be secondary to UTI.  Had extensive work-up including EEG (short encephalopathy) and spinal tap (CSF culture neg, HSV 1/HSV 2 PCR neg).  Significant events: 5/20>> admit with fever and acute metabolic encephalopathy. [id number] back to baseline-completely awake and alert  Antimicrobial therapy: Cefepime: 5/20>> Flagyl: 5/20 x1 Vancomycin: 5/20>> 5/21  Microbiology data: 5/20: Blood culture>> no growth 5/20: Urine culture>> pending 5/20: Respiratory virus panel>> negative 5/20:SARS Coronavirus 2>>negative  Significant studies: 5/20: CT head without contrast>> no acute abnormalities 5/20: Chest x-ray>> no active disease  Procedures : None  Consults: None  DVT Prophylaxis : Prophylactic Lovenox   Subjective:  Patient in bed, appears comfortable, denies any headache, no fever, no chest pain or pressure, no shortness of breath , no abdominal pain. No focal weakness.  Assessment/Plan:  Fever with acute metabolic encephalopathy: He had a similar admission in January 2021 with she was intubated because of encephalopathy due to ?? UTI, however UA from January 2021 does not seem to be very convincing, also this admission no clear focus of infection.  UA unremarkable, chest x-ray stable, MRI brain unremarkable,  mentation back to baseline after empiric antibiotics.  Urine and blood cultures so far negative, procalcitonin stable, CRP stable, not convinced entirely that she has a clear explanation for her fevers, will check CT chest abdomen pelvis and monitor, transition to oral antibiotics, may require outpatient work-up for FUO.  CKD stage IIIb: Creatinine close to baseline  Proteinuria: Unknown etiology-has been ongoing for the past several years-since not a acute issue-stable for outpatient follow-up.  HTN: On Norvasc and bisoprolol, will add scheduled hydralazine along with as needed for better control.  HLD: Resume statin  Marijuana abuse.  Counseled to quit.    Evaded D-dimer.  Lower extremity venous duplex unremarkable.    Morbid Obesity: Estimated body mass index is 33.08 kg/m as calculated from the following:   Height as of this encounter: 5\' 3"  (1.6 m).   Weight as of this encounter: 84.7 kg.    Diet: Diet Order            Diet Heart Room service appropriate? Yes; Fluid consistency: Thin  Diet effective now               Code Status: Full code   Family Communication: Nephew over the [phone]  Disposition Plan: Status is: Inpatient  Remains inpatient appropriate because:Inpatient level of care appropriate due to severity of illness   Dispo: The patient is from: Home              Anticipated d/c is to: Home              Anticipated d/c date is: 2 days              Patient  currently is not medically stable to d/c.   Barriers to Discharge: Fever with acute encephalopathy-unknown etiology-remains on IV antibiotics-not appropriate to transition to oral antimicrobial therapy.  Antimicrobial agents: Anti-infectives (From admission, onward)   Start     Dose/Rate Route Frequency Ordered Stop   01/23/20 0800  cefTRIAXone (ROCEPHIN) 1 g in sodium chloride 0.9 % 100 mL IVPB     1 g 200 mL/hr over 30 Minutes Intravenous Every 24 hours 01/23/20 0752 01/26/20 0759    01/21/20 1630  vancomycin (VANCOCIN) IVPB 1000 mg/200 mL premix  Status:  Discontinued     1,000 mg 200 mL/hr over 60 Minutes Intravenous Every 24 hours 01/20/20 1528 01/21/20 1440   01/20/20 1530  ceFEPIme (MAXIPIME) 2 g in sodium chloride 0.9 % 100 mL IVPB  Status:  Discontinued     2 g 200 mL/hr over 30 Minutes Intravenous Every 12 hours 01/20/20 1523 01/23/20 0752   01/20/20 1530  metroNIDAZOLE (FLAGYL) IVPB 500 mg     500 mg 100 mL/hr over 60 Minutes Intravenous  Once 01/20/20 1523 01/20/20 1638   01/20/20 1530  vancomycin (VANCOCIN) IVPB 1000 mg/200 mL premix  Status:  Discontinued     1,000 mg 200 mL/hr over 60 Minutes Intravenous  Once 01/20/20 1523 01/20/20 1528   01/20/20 1530  vancomycin (VANCOREADY) IVPB 1500 mg/300 mL     1,500 mg 150 mL/hr over 120 Minutes Intravenous  Once 01/20/20 1528 01/20/20 2056       Time spent: 25 minutes-Greater than 50% of this time was spent in counseling, explanation of diagnosis, planning of further management, and coordination of care.  MEDICATIONS: Scheduled Meds: . amLODipine  10 mg Oral Daily  . bisoprolol  10 mg Oral Daily  . enoxaparin (LOVENOX) injection  40 mg Subcutaneous Q24H  . iohexol  500 mL Oral Q1H  . rosuvastatin  20 mg Oral Daily  . sodium chloride flush  3 mL Intravenous Once  . sodium chloride flush  3 mL Intravenous Q12H   Continuous Infusions: . sodium chloride Stopped (01/21/20 1900)  . cefTRIAXone (ROCEPHIN)  IV 1 g (01/23/20 0952)   PRN Meds:.acetaminophen **OR** [DISCONTINUED] acetaminophen, albuterol, hydrALAZINE, [DISCONTINUED] ondansetron **OR** ondansetron (ZOFRAN) IV   PHYSICAL EXAM: Vital signs: Vitals:   01/22/20 1717 01/22/20 2022 01/23/20 0558 01/23/20 0953  BP: (!) 162/75 (!) 156/82 (!) 167/80 (!) 169/83  Pulse: 72 70 71 78  Resp: 16 17 12 18   Temp: 97.7 F (36.5 C) 97.9 F (36.6 C) 98 F (36.7 C) 98 F (36.7 C)  TempSrc: Oral Oral Oral Oral  SpO2: 95% 97% 94% 95%  Weight:      Height:        Filed Weights   01/20/20 1413 01/20/20 2056  Weight: 72.6 kg 84.7 kg   Body mass index is 33.08 kg/m.   Gen Exam:  Awake Alert, No new F.N deficits, Normal affect [place].AT,PERRAL Supple Neck,No JVD, No cervical lymphadenopathy appriciated.  Symmetrical Chest wall movement, Good air movement bilaterally, CTAB RRR,No Gallops, Rubs or new Murmurs, No Parasternal Heave +ve B.Sounds, Abd Soft, No tenderness, No organomegaly appriciated, No rebound - guarding or rigidity. No Cyanosis, Clubbing or edema, No new Rash or bruise   I have personally reviewed following labs and imaging studies  LABORATORY DATA: CBC: Recent Labs  Lab 01/20/20 1411 01/20/20 1436 01/21/20 0321 01/22/20 0626 01/23/20 0659  WBC 13.7*  --  14.4* 8.1 9.2  NEUTROABS 11.6*  --   --   --  5.6  HGB 13.6 13.9 12.3 11.6* 13.0  HCT 42.6 41.0 38.5 36.7 40.4  MCV 81.0  --  81.2 82.5 82.1  PLT 309  --  268 233 [id number]    Basic Metabolic Panel: Recent Labs  Lab 01/20/20 1411 01/20/20 1436 01/21/20 0321 01/22/20 0626 01/22/20 0918 01/23/20 0659  NA 138 138 139 139  --  139  K 4.3 4.0 3.7 3.6  --  4.8  CL 100 103 105 106  --  103  CO2 23  --  23 24  --  26  GLUCOSE 204* 202* 116* 107*  --  107*  BUN 23 24* 16 20  --  18  CREATININE 1.32* 1.20* 1.07* 1.16*  --  1.03*  CALCIUM 9.9  --  9.2 9.0  --  9.3  MG  --   --   --   --  1.8 2.0    GFR: Estimated Creatinine Clearance: 49.4 mL/min (A) (by C-G formula based on SCr of 1.03 mg/dL (H)).  Liver Function Tests: Recent Labs  Lab 01/20/20 1411 01/22/20 0626 01/23/20 0659  AST 22 14* 16  ALT 19 12 15   ALKPHOS 92 63 65  BILITOT 1.0 1.0 1.3*  PROT 8.2* 6.6 7.2  ALBUMIN 4.5 3.6 3.8   No results for input(s): LIPASE, AMYLASE in the last 168 hours. No results for input(s): AMMONIA in the last 168 hours.  Coagulation Profile: Recent Labs  Lab 01/20/20 1411  INR 1.0    Cardiac Enzymes: No results for input(s): CKTOTAL, CKMB, CKMBINDEX,  TROPONINI in the last 168 hours.  BNP (last 3 results) No results for input(s): PROBNP in the last 8760 hours.  Lipid Profile: No results for input(s): CHOL, HDL, LDLCALC, TRIG, CHOLHDL, LDLDIRECT in the last 72 hours.  Thyroid Function Tests: Recent Labs    01/22/20 0918  TSH 2.155    Anemia Panel: No results for input(s): VITAMINB12, FOLATE, FERRITIN, TIBC, IRON, RETICCTPCT in the last 72 hours.  Urine analysis:    Component Value Date/Time   COLORURINE YELLOW 01/20/2020 1522   APPEARANCEUR CLEAR 01/20/2020 1522   LABSPEC 1.024 01/20/2020 1522   PHURINE 7.0 01/20/2020 1522   GLUCOSEU NEGATIVE 01/20/2020 1522   HGBUR SMALL (A) 01/20/2020 1522   BILIRUBINUR NEGATIVE 01/20/2020 1522   KETONESUR NEGATIVE 01/20/2020 1522   PROTEINUR >=300 (A) 01/20/2020 1522   UROBILINOGEN 0.2 12/11/2011 1102   NITRITE NEGATIVE 01/20/2020 1522   LEUKOCYTESUR NEGATIVE 01/20/2020 1522    Sepsis Labs: Lactic Acid, Venous    Component Value Date/Time   LATICACIDVEN 1.5 01/20/2020 1758    MICROBIOLOGY: Recent Results (from the past 240 hour(s))  Blood Culture (routine x 2)     Status: None (Preliminary result)   Collection Time: 01/20/20  2:31 PM   Specimen: BLOOD  Result Value Ref Range Status   Specimen Description BLOOD RIGHT ANTECUBITAL  Final   Special Requests   Final    BOTTLES DRAWN AEROBIC AND ANAEROBIC Blood Culture adequate volume   Culture   Final    NO GROWTH 2 DAYS Performed at [facility], [place] [address], [place], [place] [address]    Report Status PENDING  Incomplete  Blood Culture (routine x 2)     Status: None (Preliminary result)   Collection Time: 01/20/20  2:38 PM   Specimen: BLOOD  Result Value Ref Range Status   Specimen Description BLOOD LEFT ANTECUBITAL  Final   Special Requests   Final    BOTTLES  DRAWN AEROBIC AND ANAEROBIC Blood Culture adequate volume   Culture   Final    NO GROWTH 2 DAYS Performed at [facility], [place] [address],  [place], [place] [address]    Report Status PENDING  Incomplete  SARS Coronavirus 2 by RT PCR (hospital order, performed in [facility] hospital lab) Nasopharyngeal Nasopharyngeal Swab     Status: None   Collection Time: 01/20/20  2:56 PM   Specimen: Nasopharyngeal Swab  Result Value Ref Range Status   SARS Coronavirus 2 NEGATIVE NEGATIVE Final    Comment: (NOTE) SARS-CoV-2 target nucleic acids are NOT DETECTED. The SARS-CoV-2 RNA is generally detectable in upper and lower respiratory specimens during the acute phase of infection. The lowest concentration of SARS-CoV-2 viral copies this assay can detect is 250 copies / mL. A negative result does not preclude SARS-CoV-2 infection and should not be used as the sole basis for treatment or other patient management decisions.  A negative result may occur with improper specimen collection / handling, submission of specimen other than nasopharyngeal swab, presence of viral mutation(s) within the areas targeted by this assay, and inadequate number of viral copies (<250 copies / mL). A negative result must be combined with clinical observations, patient history, and epidemiological information. Fact Sheet for Patients:   StrictlyIdeas.no Fact Sheet for Healthcare Providers: BankingDealers.co.za This test is not yet approved or cleared  by the [place] FDA and has been authorized for detection and/or diagnosis of SARS-CoV-2 by FDA under an Emergency Use Authorization (EUA).  This EUA will remain in effect (meaning this test can be used) for the duration of the COVID-19 declaration under Section 564(b)(1) of the Act, 21 U.S.C. section 360bbb-3(b)(1), unless the authorization is terminated or revoked sooner. Performed at [facility], [place] [address], [place], [place] [address]   Urine culture     Status: None   Collection Time: 01/20/20  3:22 PM   Specimen: In/Out Cath Urine  Result Value  Ref Range Status   Specimen Description IN/OUT CATH URINE  Final   Special Requests NONE  Final   Culture   Final    NO GROWTH Performed at [facility], [place] [address], [place], [place] [address]    Report Status 01/21/2020 FINAL  Final  Respiratory Panel by PCR     Status: None   Collection Time: 01/21/20  4:27 AM   Specimen: Nasopharyngeal Swab; Respiratory  Result Value Ref Range Status   Adenovirus NOT DETECTED NOT DETECTED Final   Coronavirus 229E NOT DETECTED NOT DETECTED Final    Comment: (NOTE) The Coronavirus on the Respiratory Panel, DOES NOT test for the novel  Coronavirus (2019 nCoV)    Coronavirus HKU1 NOT DETECTED NOT DETECTED Final   Coronavirus NL63 NOT DETECTED NOT DETECTED Final   Coronavirus OC43 NOT DETECTED NOT DETECTED Final   Metapneumovirus NOT DETECTED NOT DETECTED Final   Rhinovirus / Enterovirus NOT DETECTED NOT DETECTED Final   Influenza A NOT DETECTED NOT DETECTED Final   Influenza B NOT DETECTED NOT DETECTED Final   Parainfluenza Virus 1 NOT DETECTED NOT DETECTED Final   Parainfluenza Virus 2 NOT DETECTED NOT DETECTED Final   Parainfluenza Virus 3 NOT DETECTED NOT DETECTED Final   Parainfluenza Virus 4 NOT DETECTED NOT DETECTED Final   Respiratory Syncytial Virus NOT DETECTED NOT DETECTED Final   Bordetella pertussis NOT DETECTED NOT DETECTED Final   Chlamydophila pneumoniae NOT DETECTED NOT DETECTED Final   Mycoplasma pneumoniae NOT  DETECTED NOT DETECTED Final    Comment: Performed at [facility], [place] [address], [place], [place] [address]    RADIOLOGY STUDIES/RESULTS: MR BRAIN WO CONTRAST  Result Date: 01/21/2020 CLINICAL DATA:  74 year old female with confusion. Encephalopathy. EXAM: MRI HEAD WITHOUT CONTRAST TECHNIQUE: Multiplanar, multiecho pulse sequences of the brain and surrounding structures were obtained without intravenous contrast. COMPARISON:  Head CT 01/20/2020. Brain MRI 12/11/2011. FINDINGS: Brain: Cerebral volume is  within normal limits for age. No restricted diffusion to suggest acute infarction. No midline shift, mass effect, evidence of mass lesion, ventriculomegaly, extra-axial collection or acute intracranial hemorrhage. Cervicomedullary junction and pituitary are within normal limits. Widely scattered bilateral cerebral white matter T2 and FLAIR hyperintensity appears only mildly progressed since 2013. Configuration is nonspecific as before. No cortical encephalomalacia or chronic cerebral blood products identified. Small chronic infarct now suspected in the right thalamus, new since 2013 (series 6, image 13). Otherwise the deep gray matter nuclei, brainstem and cerebellum remain normal. Vascular: Major intracranial vascular flow voids are stable since 2013. Skull and upper cervical spine: Normal for age visible cervical spine and bone marrow signal. Sinuses/Orbits: Chronic postoperative changes to the globes. Bilateral paranasal sinus inflammation, chronic but increased compared to 2013. Bilateral maxillary sinus fluid levels now, and more sphenoid sinus involvement than before. Other: But bilateral mastoid air cells remain clear. Visible internal auditory structures appear normal. Scalp and face soft tissues appear negative. IMPRESSION: 1. No acute intracranial abnormality. 2. Mild progression of chronic white matter disease since 2013. And a small chronic right thalamic lacune is new since that time. 3. Acute on chronic paranasal sinus disease. Electronically Signed   By: Genevie Ann M.D.   On: 01/21/2020 18:06   VAS [place] LOWER EXTREMITY VENOUS (DVT)  Result Date: 01/22/2020  Lower Venous DVTStudy Indications: Edema, and D-Dimer.  Comparison Study: No prior study on file Performing Technologist: Sharion Dove RVS  Examination Guidelines: A complete evaluation includes B-mode imaging, spectral Doppler, color Doppler, and power Doppler as needed of all accessible portions of each vessel. Bilateral testing is considered an  integral part of a complete examination. Limited examinations for reoccurring indications may be performed as noted. The reflux portion of the exam is performed with the patient in reverse Trendelenburg.  +---------+---------------+---------+-----------+----------+--------------+ RIGHT    CompressibilityPhasicitySpontaneityPropertiesThrombus Aging +---------+---------------+---------+-----------+----------+--------------+ CFV      Full           Yes      Yes                                 +---------+---------------+---------+-----------+----------+--------------+ SFJ      Full                                                        +---------+---------------+---------+-----------+----------+--------------+ FV Prox  Full                                                        +---------+---------------+---------+-----------+----------+--------------+ FV Mid   Full                                                        +---------+---------------+---------+-----------+----------+--------------+  FV DistalFull                                                        +---------+---------------+---------+-----------+----------+--------------+ PFV      Full                                                        +---------+---------------+---------+-----------+----------+--------------+ POP      Full           Yes      Yes                                 +---------+---------------+---------+-----------+----------+--------------+ PTV      Full                                                        +---------+---------------+---------+-----------+----------+--------------+ PERO     Full                                                        +---------+---------------+---------+-----------+----------+--------------+   +---------+---------------+---------+-----------+----------+--------------+ LEFT     CompressibilityPhasicitySpontaneityPropertiesThrombus  Aging +---------+---------------+---------+-----------+----------+--------------+ CFV      Full           Yes      Yes                                 +---------+---------------+---------+-----------+----------+--------------+ SFJ      Full                                                        +---------+---------------+---------+-----------+----------+--------------+ FV Prox  Full                                                        +---------+---------------+---------+-----------+----------+--------------+ FV Mid   Full                                                        +---------+---------------+---------+-----------+----------+--------------+ FV DistalFull                                                        +---------+---------------+---------+-----------+----------+--------------+  PFV      Full                                                        +---------+---------------+---------+-----------+----------+--------------+ POP      Full           Yes      Yes                                 +---------+---------------+---------+-----------+----------+--------------+ PTV      Full                                                        +---------+---------------+---------+-----------+----------+--------------+ PERO     Full                                                        +---------+---------------+---------+-----------+----------+--------------+     *See table(s) above for measurements and observations.    Preliminary      LOS: 2 days   Lala Lund, MD  [facility]  To contact the attending provider between 7A-7P or the covering provider during after hours 7P-7A, please log into the web site [email or web address] and access using universal [place] password for that web site. If you do not have the password, please call the hospital operator.  01/23/2020, 11:01 AM

## 2020-01-24 LAB — MAGNESIUM: Magnesium: 1.9 mg/dL (ref 1.7–2.4)

## 2020-01-24 LAB — CBC WITH DIFFERENTIAL/PLATELET
Abs Immature Granulocytes: 0.05 10*3/uL (ref 0.00–0.07)
Basophils Absolute: 0 10*3/uL (ref 0.0–0.1)
Basophils Relative: 0 %
Eosinophils Absolute: 0.2 10*3/uL (ref 0.0–0.5)
Eosinophils Relative: 2 %
HCT: 37 % (ref 36.0–46.0)
Hemoglobin: 11.8 g/dL — ABNORMAL LOW (ref 12.0–15.0)
Immature Granulocytes: 1 %
Lymphocytes Relative: 28 %
Lymphs Abs: 2.5 10*3/uL (ref 0.7–4.0)
MCH: 25.9 pg — ABNORMAL LOW (ref 26.0–34.0)
MCHC: 31.9 g/dL (ref 30.0–36.0)
MCV: 81.1 fL (ref 80.0–100.0)
Monocytes Absolute: 0.7 10*3/uL (ref 0.1–1.0)
Monocytes Relative: 7 %
Neutro Abs: 5.6 10*3/uL (ref 1.7–7.7)
Neutrophils Relative %: 62 %
Platelets: 244 10*3/uL (ref 150–400)
RBC: 4.56 MIL/uL (ref 3.87–5.11)
RDW: 15.5 % (ref 11.5–15.5)
WBC: 9.1 10*3/uL (ref 4.0–10.5)
nRBC: 0 % (ref 0.0–0.2)

## 2020-01-24 LAB — COMPREHENSIVE METABOLIC PANEL
ALT: 11 U/L (ref 0–44)
AST: 12 U/L — ABNORMAL LOW (ref 15–41)
Albumin: 3.5 g/dL (ref 3.5–5.0)
Alkaline Phosphatase: 59 U/L (ref 38–126)
Anion gap: 14 (ref 5–15)
BUN: 16 mg/dL (ref 8–23)
CO2: 22 mmol/L (ref 22–32)
Calcium: 9 mg/dL (ref 8.9–10.3)
Chloride: 103 mmol/L (ref 98–111)
Creatinine, Ser: 1.01 mg/dL — ABNORMAL HIGH (ref 0.44–1.00)
GFR calc Af Amer: >60 mL/min (ref >60)
GFR calc non Af Amer: 55 mL/min — ABNORMAL LOW (ref >60)
Glucose, Bld: 100 mg/dL — ABNORMAL HIGH (ref 70–99)
Potassium: 3.2 mmol/L — ABNORMAL LOW (ref 3.5–5.1)
Sodium: 139 mmol/L (ref 135–145)
Total Bilirubin: 1.3 mg/dL — ABNORMAL HIGH (ref 0.3–1.2)
Total Protein: 6.4 g/dL — ABNORMAL LOW (ref 6.5–8.1)

## 2020-01-24 LAB — BRAIN NATRIURETIC PEPTIDE: B Natriuretic Peptide: 121.8 pg/mL — ABNORMAL HIGH (ref 0.0–100.0)

## 2020-01-24 LAB — C-REACTIVE PROTEIN: CRP: 0.5 mg/dL (ref <1.0)

## 2020-01-24 LAB — PROCALCITONIN: Procalcitonin: <0.1 ng/mL

## 2020-01-24 LAB — D-DIMER, QUANTITATIVE: D-Dimer, Quant: 2.12 ug/mL-FEU — ABNORMAL HIGH (ref 0.00–0.50)

## 2020-01-24 LAB — GLUCOSE, CAPILLARY: Glucose-Capillary: 103 mg/dL — ABNORMAL HIGH (ref 70–99)

## 2020-01-24 MED ORDER — POTASSIUM CHLORIDE CRYS ER 20 MEQ PO TBCR
40.0000 meq | EXTENDED_RELEASE_TABLET | Freq: Once | ORAL | Status: AC
  Administered 2020-01-24: 40 meq via ORAL
  Filled 2020-01-24: qty 2

## 2020-01-24 NOTE — Discharge Summary (Signed)
Sherry Kent [id number] DOB: 1946/05/24 DOA: 01/20/2020  PCP: Alycia Rossetti, MD  Admit date: 01/20/2020  Discharge date: 01/24/2020  Admitted From: Home  Disposition:  Home   Recommendations for Outpatient Follow-up:   Follow up with PCP in 1-2 weeks  PCP Please obtain BMP/CBC, 2 view CXR in 1week,  (see Discharge instructions)   PCP Please follow up on the following pending results: Needs adrenal MRI in [redacted] week along with peripheral smear, UA, CBC and CMP rechecked in 7 to 10 days.  Continue work-up for fever of unclear etiology.   Home Health: None   Equipment/Devices: None  Consultations: None  Discharge Condition: Stable    CODE STATUS: Full    Diet Recommendation: Heart Healthy   Diet Order            Diet Heart Room service appropriate? Yes; Fluid consistency: Thin  Diet effective now               CC - fever   Brief history of present illness from the day of admission and additional interim summary    Patient is a 74 y.o. female with history of HTN, HLD, anxiety who presented to the ED with confusion-was found to be febrile-she was started on broad-spectrum antimicrobial therapy and subsequently admitted to the [facility] for further eval and treatment.  Interestingly-patient had a similar but a more severe presentation in January 2021-required intubation for airway protection-ICU admission-ultimately thought to be secondary to UTI, however looking back UA was not very impressive.  Had extensive work-up including EEG (short encephalopathy) and spinal tap (CSF culture neg, HSV 1/HSV 2 PCR neg).  This admission she had no focus of infection, she was given empiric antibiotics to cover for UTI for 3 more days, her fevers have subsided and her mentation is back to baseline, she also  had head CT, head MRI, CT chest abdomen pelvis which were all essentially unremarkable except for possible adrenal adenoma for which she requires outpatient adrenal MRI to PCP.  She is now back to baseline.                                                                 Hospital Course   Fever with acute metabolic encephalopathy: He had a similar admission in January 2021 with she was intubated because of encephalopathy due to ?? UTI, however UA from January 2021 does not seem to be very convincing, also this admission no clear focus of infection.  UA unremarkable, chest x-ray stable, MRI brain unremarkable, mentation back to baseline after empiric antibiotics for total of 4 days.  Urine and blood cultures so far negative hence will stop all antibiotics, procalcitonin stable, CRP stable, not convinced entirely that she has a clear explanation for her fevers, CT head,  MRI brain, CT scan chest abdomen pelvis all nonacute except for possible adrenal adenoma for which he requires an outpatient dedicated adrenal MRI through PCP in 7 to 10 days.  She is now completely back to baseline, will also request PCP to check a peripheral smear, repeat CBC CMP in 7 to 10 days and continue outpatient work-up for FUO.  CKD stage IIIb: Creatinine close to baseline  Proteinuria: Unknown etiology-has been ongoing for the past several years-since not a acute issue-stable for outpatient follow-up.  HTN: Continue home regimen follow with PCP.  HLD: Resume statin  Marijuana abuse.  Counseled to quit.    Borderline elevated D-dimer.  Negative lower extremity venous duplex.  Morbid Obesity: Estimated body mass index is 33.08 kg/m as calculated from the following:   Height as of this encounter: 5\' 3"  (1.6 m).   Weight as of this encounter: 84.7 kg.   Follow with PCP for weight loss.   Discharge diagnosis     Principal Problem:   Fever of unknown origin Active Problems:   Chronic kidney disease, stage III  (moderate)   Acute metabolic encephalopathy   Confusion    Discharge instructions    Discharge Instructions    Discharge instructions   Complete by: As directed    Adrenal MRI needed in 2 weeks - by PCP  Follow with Primary MD Alycia Rossetti, MD in 7 days   Get UA, CBC, CMP, peripheral smear -  checked next visit within 1 week by Primary MD   Activity: As tolerated with Full fall precautions use walker/cane & assistance as needed  Disposition Home   Diet: Heart Healthy     Special Instructions: If you have smoked or chewed Tobacco  in the last 2 yrs please stop smoking, stop any regular Alcohol  and or any Recreational drug use.  On your next visit with your primary care physician please Get Medicines reviewed and adjusted.  Please request your Prim.MD to go over all Hospital Tests and Procedure/Radiological results at the follow up, please get all Hospital records sent to your Prim MD by signing hospital release before you go home.  If you experience worsening of your admission symptoms, develop shortness of breath, life threatening emergency, suicidal or homicidal thoughts you must seek medical attention immediately by calling 911 or calling your MD immediately  if symptoms less severe.  You Must read complete instructions/literature along with all the possible adverse reactions/side effects for all the Medicines you take and that have been prescribed to you. Take any new Medicines after you have completely understood and accpet all the possible adverse reactions/side effects.   Increase activity slowly   Complete by: As directed       Discharge Medications   Allergies as of 01/24/2020   No Known Allergies     Medication List    STOP taking these medications   cephALEXin 250 MG capsule Commonly known as: KEFLEX     TAKE these medications   amLODipine 10 MG tablet Commonly known as: NORVASC Take 1 tablet (10 mg total) by mouth daily.   aspirin EC 81 MG  tablet Take 81 mg by mouth daily.   bisoprolol 10 MG tablet Commonly known as: ZEBETA Take 1 tablet (10 mg total) by mouth daily.   CALCIUM 1200 PO Take 1 tablet by mouth daily. In divided doses   lisinopril 40 MG tablet Commonly known as: ZESTRIL TAKE 1 TABLET(40 MG) BY MOUTH DAILY What changed: See the new instructions.  meloxicam 7.5 MG tablet Commonly known as: MOBIC TAKE 1 TABLET(7.5 MG) BY MOUTH DAILY What changed: See the new instructions.   omeprazole 20 MG capsule Commonly known as: PRILOSEC Take 1 capsule (20 mg total) by mouth daily.   rosuvastatin 20 MG tablet Commonly known as: CRESTOR TAKE 1 TABLET(20 MG) BY MOUTH DAILY What changed: See the new instructions.   sertraline 50 MG tablet Commonly known as: ZOLOFT TAKE 1 TABLET(50 MG) BY MOUTH DAILY What changed: See the new instructions.       Follow-up Information    Dotsero, Modena Nunnery, MD. Schedule an appointment as soon as possible for a visit in 1 week(s).   Specialty: Family Medicine Why: You need adrenal MRI, peripheral smear, UA, CBC and CMP repeated Contact information: [place] [address] [place] [address] [phone]           Major procedures and Radiology Reports - PLEASE review detailed and final reports thoroughly  -       CT HEAD WO CONTRAST  Result Date: 01/20/2020 CLINICAL DATA:  Confused, possible fall EXAM: CT HEAD WITHOUT CONTRAST TECHNIQUE: Contiguous axial images were obtained from the base of the skull through the vertex without intravenous contrast. COMPARISON:  09/15/2019 FINDINGS: Brain: There is no acute intracranial hemorrhage, mass effect, or edema. Gray-white differentiation is preserved. There is no extra-axial fluid collection. Ventricles and sulci are stable in size and configuration. Patchy hypoattenuation in the supratentorial white matter is nonspecific but probably reflects stable chronic microvascular ischemic changes. Vascular: There is mild atherosclerotic  calcification at the skull base. Skull: Calvarium is unremarkable. Sinuses/Orbits: Chronic lobular paranasal sinus mucosal thickening with bilateral maxillary sinus air-fluid levels. No acute orbital finding. Other: None. IMPRESSION: No acute intracranial abnormality. Chronic diffuse paranasal sinus inflammatory changes. There are nonspecific air-fluid levels within the maxillary sinuses, which can reflect acute sinusitis in the appropriate clinical setting. Electronically Signed   By: Macy Mis M.D.   On: 01/20/2020 14:43   CT CHEST W CONTRAST  Result Date: 01/23/2020 CLINICAL DATA:  Persistent cough, hypertension, febrile, confusion EXAM: CT CHEST, ABDOMEN, AND PELVIS WITH CONTRAST TECHNIQUE: Multidetector CT imaging of the chest, abdomen and pelvis was performed following the standard protocol during bolus administration of intravenous contrast. CONTRAST:  15mL OMNIPAQUE IOHEXOL 300 MG/ML  SOLN COMPARISON:  01/20/2020 FINDINGS: CT CHEST FINDINGS Cardiovascular: Heart and great vessels are unremarkable with no pericardial effusion. Minimal atherosclerosis of the thoracic aorta. No aneurysm or dissection. There is moderate atherosclerosis of the LAD distribution of the coronary vasculature. Mediastinum/Nodes: No enlarged mediastinal, hilar, or axillary lymph nodes. Thyroid gland, trachea, and esophagus demonstrate no significant findings. Lungs/Pleura: Mild upper lobe predominant emphysema. No acute airspace disease, effusion, or pneumothorax. Central airways are patent. Musculoskeletal: There are no acute or destructive bony lesions. Reconstructed images demonstrate no additional findings. CT ABDOMEN PELVIS FINDINGS Hepatobiliary: Mild hepatic steatosis. No focal liver abnormalities. Gallbladder is grossly normal. Pancreas: Unremarkable. No pancreatic ductal dilatation or surrounding inflammatory changes. Spleen: Normal in size without focal abnormality. Adrenals/Urinary Tract: There is nonspecific  nodular thickening of the bilateral adrenal glands, measuring up to 18 mm on the right and 17 mm on the left. Mild bilateral renal cortical atrophy. No urinary tract calculi or obstruction. The bladder is unremarkable. Stomach/Bowel: No bowel obstruction or ileus. The appendix is surgically absent. There is scattered diverticulosis of the distal colon, most pronounced in the sigmoid, without diverticulitis. Vascular/Lymphatic: Aortic atherosclerosis. No enlarged abdominal or pelvic lymph nodes. Reproductive: Simple  appearing 2.8 x 3.0 cm left ovarian cyst is noted. Right adnexa and uterus are unremarkable. Other: No abdominal wall hernia or abnormality. No abdominopelvic ascites. Musculoskeletal: No acute or destructive bony lesions. There is extensive multilevel lumbar spondylosis and facet hypertrophy. Left convex scoliosis is seen at the thoracolumbar junction. Reconstructed images demonstrate no additional findings. IMPRESSION: 1. No acute intrathoracic or intra-abdominal pathology. 2. Mild hepatic steatosis. 3. Nonspecific nodular thickening of the bilateral adrenal glands, statistically likely reflecting adrenal hyperplasia or bilateral adenomas. Nonemergent dedicated adrenal MRI follow-up could be considered if further imaging is indicated. 4. Aortic Atherosclerosis (ICD10-I70.0) and Emphysema (ICD10-J43.9). Electronically Signed   By: Randa Ngo M.D.   On: 01/23/2020 15:42   MR BRAIN WO CONTRAST  Result Date: 01/21/2020 CLINICAL DATA:  74 year old female with confusion. Encephalopathy. EXAM: MRI HEAD WITHOUT CONTRAST TECHNIQUE: Multiplanar, multiecho pulse sequences of the brain and surrounding structures were obtained without intravenous contrast. COMPARISON:  Head CT 01/20/2020. Brain MRI 12/11/2011. FINDINGS: Brain: Cerebral volume is within normal limits for age. No restricted diffusion to suggest acute infarction. No midline shift, mass effect, evidence of mass lesion, ventriculomegaly,  extra-axial collection or acute intracranial hemorrhage. Cervicomedullary junction and pituitary are within normal limits. Widely scattered bilateral cerebral white matter T2 and FLAIR hyperintensity appears only mildly progressed since 2013. Configuration is nonspecific as before. No cortical encephalomalacia or chronic cerebral blood products identified. Small chronic infarct now suspected in the right thalamus, new since 2013 (series 6, image 13). Otherwise the deep gray matter nuclei, brainstem and cerebellum remain normal. Vascular: Major intracranial vascular flow voids are stable since 2013. Skull and upper cervical spine: Normal for age visible cervical spine and bone marrow signal. Sinuses/Orbits: Chronic postoperative changes to the globes. Bilateral paranasal sinus inflammation, chronic but increased compared to 2013. Bilateral maxillary sinus fluid levels now, and more sphenoid sinus involvement than before. Other: But bilateral mastoid air cells remain clear. Visible internal auditory structures appear normal. Scalp and face soft tissues appear negative. IMPRESSION: 1. No acute intracranial abnormality. 2. Mild progression of chronic white matter disease since 2013. And a small chronic right thalamic lacune is new since that time. 3. Acute on chronic paranasal sinus disease. Electronically Signed   By: Genevie Ann M.D.   On: 01/21/2020 18:06   CT ABDOMEN PELVIS W CONTRAST  Result Date: 01/23/2020 CLINICAL DATA:  Persistent cough, hypertension, febrile, confusion EXAM: CT CHEST, ABDOMEN, AND PELVIS WITH CONTRAST TECHNIQUE: Multidetector CT imaging of the chest, abdomen and pelvis was performed following the standard protocol during bolus administration of intravenous contrast. CONTRAST:  150mL OMNIPAQUE IOHEXOL 300 MG/ML  SOLN COMPARISON:  01/20/2020 FINDINGS: CT CHEST FINDINGS Cardiovascular: Heart and great vessels are unremarkable with no pericardial effusion. Minimal atherosclerosis of the thoracic  aorta. No aneurysm or dissection. There is moderate atherosclerosis of the LAD distribution of the coronary vasculature. Mediastinum/Nodes: No enlarged mediastinal, hilar, or axillary lymph nodes. Thyroid gland, trachea, and esophagus demonstrate no significant findings. Lungs/Pleura: Mild upper lobe predominant emphysema. No acute airspace disease, effusion, or pneumothorax. Central airways are patent. Musculoskeletal: There are no acute or destructive bony lesions. Reconstructed images demonstrate no additional findings. CT ABDOMEN PELVIS FINDINGS Hepatobiliary: Mild hepatic steatosis. No focal liver abnormalities. Gallbladder is grossly normal. Pancreas: Unremarkable. No pancreatic ductal dilatation or surrounding inflammatory changes. Spleen: Normal in size without focal abnormality. Adrenals/Urinary Tract: There is nonspecific nodular thickening of the bilateral adrenal glands, measuring up to 18 mm on the right and 17 mm on the left. Mild  bilateral renal cortical atrophy. No urinary tract calculi or obstruction. The bladder is unremarkable. Stomach/Bowel: No bowel obstruction or ileus. The appendix is surgically absent. There is scattered diverticulosis of the distal colon, most pronounced in the sigmoid, without diverticulitis. Vascular/Lymphatic: Aortic atherosclerosis. No enlarged abdominal or pelvic lymph nodes. Reproductive: Simple appearing 2.8 x 3.0 cm left ovarian cyst is noted. Right adnexa and uterus are unremarkable. Other: No abdominal wall hernia or abnormality. No abdominopelvic ascites. Musculoskeletal: No acute or destructive bony lesions. There is extensive multilevel lumbar spondylosis and facet hypertrophy. Left convex scoliosis is seen at the thoracolumbar junction. Reconstructed images demonstrate no additional findings. IMPRESSION: 1. No acute intrathoracic or intra-abdominal pathology. 2. Mild hepatic steatosis. 3. Nonspecific nodular thickening of the bilateral adrenal glands,  statistically likely reflecting adrenal hyperplasia or bilateral adenomas. Nonemergent dedicated adrenal MRI follow-up could be considered if further imaging is indicated. 4. Aortic Atherosclerosis (ICD10-I70.0) and Emphysema (ICD10-J43.9). Electronically Signed   By: Randa Ngo M.D.   On: 01/23/2020 15:42   DG Chest Port 1 View  Result Date: 01/20/2020 CLINICAL DATA:  Altered mental status EXAM: PORTABLE CHEST 1 VIEW COMPARISON:  09/16/2019 FINDINGS: The heart size and mediastinal contours are within normal limits. Both lungs are clear. The visualized skeletal structures are unremarkable. IMPRESSION: No active disease. Electronically Signed   By: Franchot Gallo M.D.   On: 01/20/2020 14:49   VAS [place] LOWER EXTREMITY VENOUS (DVT)  Result Date: 01/24/2020  Lower Venous DVTStudy Indications: Edema, and D-Dimer.  Comparison Study: No prior study on file Performing Technologist: Sharion Dove RVS  Examination Guidelines: A complete evaluation includes B-mode imaging, spectral Doppler, color Doppler, and power Doppler as needed of all accessible portions of each vessel. Bilateral testing is considered an integral part of a complete examination. Limited examinations for reoccurring indications may be performed as noted. The reflux portion of the exam is performed with the patient in reverse Trendelenburg.  +---------+---------------+---------+-----------+----------+--------------+  RIGHT     Compressibility Phasicity Spontaneity Properties Thrombus Aging  +---------+---------------+---------+-----------+----------+--------------+  CFV       Full            Yes       Yes                                    +---------+---------------+---------+-----------+----------+--------------+  SFJ       Full                                                             +---------+---------------+---------+-----------+----------+--------------+  FV Prox   Full                                                              +---------+---------------+---------+-----------+----------+--------------+  FV Mid    Full                                                             +---------+---------------+---------+-----------+----------+--------------+  FV Distal Full                                                             +---------+---------------+---------+-----------+----------+--------------+  PFV       Full                                                             +---------+---------------+---------+-----------+----------+--------------+  POP       Full            Yes       Yes                                    +---------+---------------+---------+-----------+----------+--------------+  PTV       Full                                                             +---------+---------------+---------+-----------+----------+--------------+  PERO      Full                                                             +---------+---------------+---------+-----------+----------+--------------+   +---------+---------------+---------+-----------+----------+--------------+  LEFT      Compressibility Phasicity Spontaneity Properties Thrombus Aging  +---------+---------------+---------+-----------+----------+--------------+  CFV       Full            Yes       Yes                                    +---------+---------------+---------+-----------+----------+--------------+  SFJ       Full                                                             +---------+---------------+---------+-----------+----------+--------------+  FV Prox   Full                                                             +---------+---------------+---------+-----------+----------+--------------+  FV Mid    Full                                                             +---------+---------------+---------+-----------+----------+--------------+  FV Distal Full                                                              +---------+---------------+---------+-----------+----------+--------------+  PFV       Full                                                             +---------+---------------+---------+-----------+----------+--------------+  POP       Full            Yes       Yes                                    +---------+---------------+---------+-----------+----------+--------------+  PTV       Full                                                             +---------+---------------+---------+-----------+----------+--------------+  PERO      Full                                                             +---------+---------------+---------+-----------+----------+--------------+     *See table(s) above for measurements and observations. Electronically signed by Servando Snare MD on 01/24/2020 at 12:53:23 AM.    Final     Micro Results    Recent Results (from the past 240 hour(s))  Blood Culture (routine x 2)     Status: None (Preliminary result)   Collection Time: 01/20/20  2:31 PM   Specimen: BLOOD  Result Value Ref Range Status   Specimen Description BLOOD RIGHT ANTECUBITAL  Final   Special Requests   Final    BOTTLES DRAWN AEROBIC AND ANAEROBIC Blood Culture adequate volume   Culture   Final    NO GROWTH 4 DAYS Performed at [facility], [address] [place], [place] [address]    Report Status PENDING  Incomplete  Blood Culture (routine x 2)     Status: None (Preliminary result)   Collection Time: 01/20/20  2:38 PM   Specimen: BLOOD  Result Value Ref Range Status   Specimen Description BLOOD LEFT ANTECUBITAL  Final   Special Requests   Final    BOTTLES DRAWN AEROBIC AND ANAEROBIC Blood Culture adequate volume   Culture   Final    NO GROWTH 4 DAYS Performed at [facility], [place] [address], [place], [place] [address]    Report Status PENDING  Incomplete  SARS Coronavirus 2 by RT PCR (hospital order, performed in [facility] hospital lab) Nasopharyngeal Nasopharyngeal Swab      Status: None   Collection Time: 01/20/20  2:56  PM   Specimen: Nasopharyngeal Swab  Result Value Ref Range Status   SARS Coronavirus 2 NEGATIVE NEGATIVE Final    Comment: (NOTE) SARS-CoV-2 target nucleic acids are NOT DETECTED. The SARS-CoV-2 RNA is generally detectable in upper and lower respiratory specimens during the acute phase of infection. The lowest concentration of SARS-CoV-2 viral copies this assay can detect is 250 copies / mL. A negative result does not preclude SARS-CoV-2 infection and should not be used as the sole basis for treatment or other patient management decisions.  A negative result may occur with improper specimen collection / handling, submission of specimen other than nasopharyngeal swab, presence of viral mutation(s) within the areas targeted by this assay, and inadequate number of viral copies (<250 copies / mL). A negative result must be combined with clinical observations, patient history, and epidemiological information. Fact Sheet for Patients:   StrictlyIdeas.no Fact Sheet for Healthcare Providers: BankingDealers.co.za This test is not yet approved or cleared  by the [place] FDA and has been authorized for detection and/or diagnosis of SARS-CoV-2 by FDA under an Emergency Use Authorization (EUA).  This EUA will remain in effect (meaning this test can be used) for the duration of the COVID-19 declaration under Section 564(b)(1) of the Act, 21 U.S.C. section 360bbb-3(b)(1), unless the authorization is terminated or revoked sooner. Performed at [facility], [place] [address] [place], [place] [address]   Urine culture     Status: None   Collection Time: 01/20/20  3:22 PM   Specimen: In/Out Cath Urine  Result Value Ref Range Status   Specimen Description IN/OUT CATH URINE  Final   Special Requests NONE  Final   Culture   Final    NO GROWTH Performed at [facility], [place] [address]  [place], [place] [address]    Report Status 01/21/2020 FINAL  Final  Respiratory Panel by PCR     Status: None   Collection Time: 01/21/20  4:27 AM   Specimen: Nasopharyngeal Swab; Respiratory  Result Value Ref Range Status   Adenovirus NOT DETECTED NOT DETECTED Final   Coronavirus 229E NOT DETECTED NOT DETECTED Final    Comment: (NOTE) The Coronavirus on the Respiratory Panel, DOES NOT test for the novel  Coronavirus (2019 nCoV)    Coronavirus HKU1 NOT DETECTED NOT DETECTED Final   Coronavirus NL63 NOT DETECTED NOT DETECTED Final   Coronavirus OC43 NOT DETECTED NOT DETECTED Final   Metapneumovirus NOT DETECTED NOT DETECTED Final   Rhinovirus / Enterovirus NOT DETECTED NOT DETECTED Final   Influenza A NOT DETECTED NOT DETECTED Final   Influenza B NOT DETECTED NOT DETECTED Final   Parainfluenza Virus 1 NOT DETECTED NOT DETECTED Final   Parainfluenza Virus 2 NOT DETECTED NOT DETECTED Final   Parainfluenza Virus 3 NOT DETECTED NOT DETECTED Final   Parainfluenza Virus 4 NOT DETECTED NOT DETECTED Final   Respiratory Syncytial Virus NOT DETECTED NOT DETECTED Final   Bordetella pertussis NOT DETECTED NOT DETECTED Final   Chlamydophila pneumoniae NOT DETECTED NOT DETECTED Final   Mycoplasma pneumoniae NOT DETECTED NOT DETECTED Final    Comment: Performed at [facility], [place]. [address] [place], [place] [address]    Today   Subjective    Shahnaz Klass today has no headache,no chest abdominal pain,no new weakness tingling or numbness, feels much better wants to go home today.    Objective   Blood pressure (!) 164/96, pulse 87, temperature 98.7 F (37.1 C), temperature source Oral, resp.  rate 17, height 5\' 3"  (1.6 m), weight 80.2 kg, SpO2 98 %.   Intake/Output Summary (Last 24 hours) at 01/24/2020 1002 Last data filed at 01/23/2020 2009 Gross per 24 hour  Intake 1116.93 ml  Output --  Net 1116.93 ml    Exam  Awake Alert, No new F.N deficits, Normal  affect [place].AT,PERRAL Supple Neck,No JVD, No cervical lymphadenopathy appriciated.  Symmetrical Chest wall movement, Good air movement bilaterally, CTAB RRR,No Gallops,Rubs or new Murmurs, No Parasternal Heave +ve B.Sounds, Abd Soft, Non tender, No organomegaly appriciated, No rebound -guarding or rigidity. No Cyanosis, Clubbing or edema, No new Rash or bruise   Data Review   CBC w Diff:  Lab Results  Component Value Date   WBC 9.1 01/24/2020   HGB 11.8 (L) 01/24/2020   HCT 37.0 01/24/2020   PLT 244 01/24/2020   LYMPHOPCT 28 01/24/2020   MONOPCT 7 01/24/2020   EOSPCT 2 01/24/2020   BASOPCT 0 01/24/2020    CMP:  Lab Results  Component Value Date   NA 139 01/24/2020   K 3.2 (L) 01/24/2020   CL 103 01/24/2020   CO2 22 01/24/2020   BUN 16 01/24/2020   CREATININE 1.01 (H) 01/24/2020   CREATININE 1.35 (H) 12/24/2019   PROT 6.4 (L) 01/24/2020   ALBUMIN 3.5 01/24/2020   BILITOT 1.3 (H) 01/24/2020   ALKPHOS 59 01/24/2020   AST 12 (L) 01/24/2020   ALT 11 01/24/2020  .   Total Time in preparing paper work, data evaluation and todays exam - 67 minutes  Lala Lund M.D on 01/24/2020 at 10:02 AM  [facility]   Office  [phone]

## 2020-01-24 NOTE — Discharge Planning (Signed)
Nsg Discharge Note  Admit Date:  01/20/2020 Discharge date: 01/24/2020   Sherry Kent to be D/C'd Home per MD order.  AVS completed.     Discharge Medication: Allergies as of 01/24/2020   No Known Allergies     Medication List    STOP taking these medications   cephALEXin 250 MG capsule Commonly known as: KEFLEX     TAKE these medications   amLODipine 10 MG tablet Commonly known as: NORVASC Take 1 tablet (10 mg total) by mouth daily.   aspirin EC 81 MG tablet Take 81 mg by mouth daily.   bisoprolol 10 MG tablet Commonly known as: ZEBETA Take 1 tablet (10 mg total) by mouth daily.   CALCIUM 1200 PO Take 1 tablet by mouth daily. In divided doses   lisinopril 40 MG tablet Commonly known as: ZESTRIL TAKE 1 TABLET(40 MG) BY MOUTH DAILY What changed: See the new instructions.   meloxicam 7.5 MG tablet Commonly known as: MOBIC TAKE 1 TABLET(7.5 MG) BY MOUTH DAILY What changed: See the new instructions.   omeprazole 20 MG capsule Commonly known as: PRILOSEC Take 1 capsule (20 mg total) by mouth daily.   rosuvastatin 20 MG tablet Commonly known as: CRESTOR TAKE 1 TABLET(20 MG) BY MOUTH DAILY What changed: See the new instructions.   sertraline 50 MG tablet Commonly known as: ZOLOFT TAKE 1 TABLET(50 MG) BY MOUTH DAILY What changed: See the new instructions.       Discharge Assessment: Vitals:   01/24/20 0618 01/24/20 0806  BP: (!) 124/54 (!) 164/96  Pulse: 78 87  Resp: 18 17  Temp: 98.7 F (37.1 C)   SpO2: 97% 98%   Skin clean, dry and intact without evidence of skin break down, no evidence of skin tears noted. IV catheter discontinued intact. Site without signs and symptoms of complications - no redness or edema noted at insertion site, patient denies c/o pain - only slight tenderness at site.  Dressing with slight pressure applied.  D/c Instructions-Education: Discharge instructions given to patient/family with verbalized understanding. D/c education  completed with patient/family including follow up instructions, medication list, d/c activities limitations if indicated, with other d/c instructions as indicated by MD - patient able to verbalize understanding, all questions fully answered. Patient instructed to return to ED, call 911, or call MD for any changes in condition.  Patient escorted via [place], and D/C home via private auto.  Hiram Comber, RN 01/24/2020 10:31 AM

## 2020-01-24 NOTE — Discharge Instructions (Signed)
Adrenal MRI needed in 2 weeks - by PCP  Follow with Primary MD Alycia Rossetti, MD in 7 days   Get UA, CBC, CMP, peripheral smear -  checked next visit within 1 week by Primary MD   Activity: As tolerated with Full fall precautions use walker/cane & assistance as needed  Disposition Home   Diet: Heart Healthy     Special Instructions: If you have smoked or chewed Tobacco  in the last 2 yrs please stop smoking, stop any regular Alcohol  and or any Recreational drug use.  On your next visit with your primary care physician please Get Medicines reviewed and adjusted.  Please request your Prim.MD to go over all Hospital Tests and Procedure/Radiological results at the follow up, please get all Hospital records sent to your Prim MD by signing hospital release before you go home.  If you experience worsening of your admission symptoms, develop shortness of breath, life threatening emergency, suicidal or homicidal thoughts you must seek medical attention immediately by calling 911 or calling your MD immediately  if symptoms less severe.  You Must read complete instructions/literature along with all the possible adverse reactions/side effects for all the Medicines you take and that have been prescribed to you. Take any new Medicines after you have completely understood and accpet all the possible adverse reactions/side effects.

## 2020-01-25 LAB — CULTURE, BLOOD (ROUTINE X 2)
Culture: NO GROWTH
Culture: NO GROWTH
Special Requests: ADEQUATE
Special Requests: ADEQUATE

## 2020-02-02 ENCOUNTER — Telehealth: Payer: Self-pay | Admitting: Family Medicine

## 2020-02-02 ENCOUNTER — Inpatient Hospital Stay: Payer: Medicare PPO | Admitting: Family Medicine

## 2020-02-02 NOTE — Telephone Encounter (Signed)
Noted  

## 2020-02-02 NOTE — Telephone Encounter (Signed)
-----   Message from Kinder, LPN sent at [id number] 12:27 PM EDT ----- Regarding: Appointment Call placed to patient to follow up on missed appointment.   Reports that patient sister passed on 2020-02-03.  States that she will call to re-schedule on 6/3 or 6/4.  Of note, patient noted to be coherent and expressing self well. States that she feels better than she has in a while.

## 2020-02-15 ENCOUNTER — Other Ambulatory Visit: Payer: Self-pay

## 2020-02-15 ENCOUNTER — Ambulatory Visit: Payer: Medicare PPO | Admitting: Family Medicine

## 2020-02-15 ENCOUNTER — Encounter: Payer: Self-pay | Admitting: Family Medicine

## 2020-02-15 VITALS — BP 136/74 | HR 80 | Temp 98.2°F | Resp 16 | Ht 66.0 in | Wt 183.0 lb

## 2020-02-15 DIAGNOSIS — R609 Edema, unspecified: Secondary | ICD-10-CM

## 2020-02-15 DIAGNOSIS — M545 Low back pain, unspecified: Secondary | ICD-10-CM

## 2020-02-15 DIAGNOSIS — R509 Fever, unspecified: Secondary | ICD-10-CM

## 2020-02-15 DIAGNOSIS — E278 Other specified disorders of adrenal gland: Secondary | ICD-10-CM

## 2020-02-15 DIAGNOSIS — R6889 Other general symptoms and signs: Secondary | ICD-10-CM | POA: Diagnosis not present

## 2020-02-15 DIAGNOSIS — R799 Abnormal finding of blood chemistry, unspecified: Secondary | ICD-10-CM | POA: Diagnosis not present

## 2020-02-15 DIAGNOSIS — D649 Anemia, unspecified: Secondary | ICD-10-CM | POA: Diagnosis not present

## 2020-02-15 DIAGNOSIS — R600 Localized edema: Secondary | ICD-10-CM

## 2020-02-15 LAB — URINALYSIS, ROUTINE W REFLEX MICROSCOPIC
Bacteria, UA: NONE SEEN /HPF
Bilirubin Urine: NEGATIVE
Glucose, UA: NEGATIVE
Hyaline Cast: NONE SEEN /LPF
Ketones, ur: NEGATIVE
Leukocytes,Ua: NEGATIVE
Nitrite: NEGATIVE
Specific Gravity, Urine: 1.02 (ref 1.001–1.03)
WBC, UA: NONE SEEN /HPF (ref 0–5)
pH: 6 (ref 5.0–8.0)

## 2020-02-15 LAB — MICROSCOPIC MESSAGE

## 2020-02-15 MED ORDER — FUROSEMIDE 20 MG PO TABS: ORAL_TABLET | ORAL | 3 refills | Status: DC

## 2020-02-15 MED ORDER — TIZANIDINE HCL 4 MG PO TABS
4.0000 mg | ORAL_TABLET | Freq: Four times a day (QID) | ORAL | 0 refills | Status: DC | PRN
Start: 2020-02-15 — End: 2020-08-28

## 2020-02-15 NOTE — Progress Notes (Signed)
Subjective:    Patient ID: Sherry Kent, female    DOB: 03-01-1946, 74 y.o.   MRN: [id number]  Patient presents for Hospital F/U (AMS/ fever), R Hip Pain (uscle spasm that is causing decreased ROM), and Edema (BLE edema)  Patient here for hospital follow-up.  She was admitted once again secondary to altered mental status and fever of unknown origin.  She was admitted back in January with similar symptoms when she was at work and became altered, strokelike symptoms.  She has significant work-up there is concern for possible UTI though the culture was negative.  She did improve with antibiotics and she was also found to have fever at that time.  A couple of weeks ago a similar episode happened.  She was altered she states she can tell that the words are not coming out correctly.  Then she lost time does not remember anything between getting coming to the emergency room.  When she was evaluated emergency room she had a temperature of 101.74F.  She had CT of chest done CT of head MRI CT abdomen urinalysis EEG all were unremarkable.  She was started on antibiotics broad-spectrum to cover for infection.  Of note COVID-19 was also negative.  She did improve after few days on antibiotics but still unknown cause.  She was found to have incidental bilateral adrenal lesions in which she needs an MRI to follow-up.  Today she states that she feels back to her baseline with the exception of right lower back pain.  This occurred since she was in the hospital.  Feels like a muscle strain.  The pain occasionally radiates to her buttocks depending on what when she turns.  There has been no change in bowel or bladder.  She is also has some swelling intermittently in her feet.  She did have ultrasound done to rule out DVT.    Review Of Systems:  GEN- denies fatigue, +fever, weight loss,weakness, recent illness HEENT- denies eye drainage, change in vision, nasal discharge, CVS- denies chest pain, palpitations RESP-  denies SOB, cough, wheeze ABD- denies N/V, change in stools, abd pain GU- denies dysuria, hematuria, dribbling, incontinence MSK- + joint pain, muscle aches, injury Neuro- denies headache, dizziness, syncope, seizure activity       Objective:    BP 136/74   Pulse 80   Temp 98.2 F (36.8 C) (Temporal)   Resp 16   Ht 5\' 6"  (1.676 m)   Wt 183 lb (83 kg)   SpO2 95%   BMI 29.54 kg/m  GEN- NAD, alert and oriented x3 HEENT- PERRL, EOMI, non injected sclera, pink conjunctiva, MMM, oropharynx clear Neck- Supple, no thyromegaly, no JVD  CVS- RRR, no murmur RESP-CTAB ABD-NABS,soft,NT,ND MSKLumbar spine NT, TTP right paraspinals, + spasm, fair ROM, pain with flexion/extension. Neg SLR Neuro- CNII-XII in tact, antalgic gait, sensation grossly in tact, strength in tact bilat LE EXT- trace pedal edema bilat   Pulses- Radial, DP- 2+        Assessment & Plan:      Problem List Items Addressed This Visit    None    Visit Diagnoses    Peripheral edema    -  Primary   Will give lasix 20mg  once a day for a week prn    Fever, unknown origin       Unclear cause of her AMS episodes with fever, work up thus far has been benign. Will send to infectious disease for any recommendations on further work up  for infection   Relevant Orders   CBC with Differential/Platelet   Comprehensive metabolic panel   Urinalysis, Routine w reflex microscopic   Urine Culture   Pathologist smear review   MR ABDOMEN W WO CONTRAST   Ambulatory referral to Infectious Disease   Acute right-sided low back pain without sciatica       MSK pain, no red flags on exam, likely due to laying in bed. given zanaflex for evening, tyelnol during the day, topical rubs and heating pad   Relevant Medications   tiZANidine (ZANAFLEX) 4 MG tablet   Mass of both adrenal glands ([place])       MRi to be done to evaluate lesions    Relevant Orders   MR ABDOMEN W WO CONTRAST      Note: This dictation was prepared with Dragon  dictation along with smaller phrase technology. Any transcriptional errors that result from this process are unintentional.

## 2020-02-15 NOTE — Patient Instructions (Signed)
Take fluid pill in the morning for 1 week Elevate your feet  Try muscle relaxer at bedtime Use topical rub and heating pad  MRI of to be done Labs to be done  Infectious disease referral  F/U pending results

## 2020-02-16 LAB — URINE CULTURE
MICRO NUMBER:: 10592891
SPECIMEN QUALITY:: ADEQUATE

## 2020-02-18 ENCOUNTER — Other Ambulatory Visit: Payer: Self-pay | Admitting: *Deleted

## 2020-02-18 DIAGNOSIS — R809 Proteinuria, unspecified: Secondary | ICD-10-CM

## 2020-02-18 DIAGNOSIS — R319 Hematuria, unspecified: Secondary | ICD-10-CM

## 2020-02-18 LAB — COMPREHENSIVE METABOLIC PANEL
AG Ratio: 1.6 (calc) (ref 1.0–2.5)
ALT: 9 U/L (ref 6–29)
AST: 12 U/L (ref 10–35)
Albumin: 4.5 g/dL (ref 3.6–5.1)
Alkaline phosphatase (APISO): 87 U/L (ref 37–153)
BUN/Creatinine Ratio: 17 (calc) (ref 6–22)
BUN: 19 mg/dL (ref 7–25)
CO2: 25 mmol/L (ref 20–32)
Calcium: 9.6 mg/dL (ref 8.6–10.4)
Chloride: 104 mmol/L (ref 98–110)
Creat: 1.15 mg/dL — ABNORMAL HIGH (ref 0.60–0.93)
Globulin: 2.9 g/dL (calc) (ref 1.9–3.7)
Glucose, Bld: 100 mg/dL — ABNORMAL HIGH (ref 65–99)
Potassium: 4.8 mmol/L (ref 3.5–5.3)
Sodium: 140 mmol/L (ref 135–146)
Total Bilirubin: 0.4 mg/dL (ref 0.2–1.2)
Total Protein: 7.4 g/dL (ref 6.1–8.1)

## 2020-02-18 LAB — IRON,TIBC AND FERRITIN PANEL
%SAT: 13 % (calc) — ABNORMAL LOW (ref 16–45)
Ferritin: 22 ng/mL (ref 16–288)
Iron: 51 ug/dL (ref 45–160)
TIBC: 389 mcg/dL (calc) (ref 250–450)

## 2020-02-18 LAB — CBC WITH DIFFERENTIAL/PLATELET
Absolute Monocytes: 578 cells/uL (ref 200–950)
Basophils Absolute: 60 cells/uL (ref 0–200)
Basophils Relative: 0.7 %
Eosinophils Absolute: 655 cells/uL — ABNORMAL HIGH (ref 15–500)
Eosinophils Relative: 7.7 %
HCT: 38.5 % (ref 35.0–45.0)
Hemoglobin: 12.4 g/dL (ref 11.7–15.5)
Lymphs Abs: 2542 cells/uL (ref 850–3900)
MCH: 25.9 pg — ABNORMAL LOW (ref 27.0–33.0)
MCHC: 32.2 g/dL (ref 32.0–36.0)
MCV: 80.4 fL (ref 80.0–100.0)
MPV: 8.8 fL (ref 7.5–12.5)
Monocytes Relative: 6.8 %
Neutro Abs: 4667 cells/uL (ref 1500–7800)
Neutrophils Relative %: 54.9 %
Platelets: 264 10*3/uL (ref 140–400)
RBC: 4.79 10*6/uL (ref 3.80–5.10)
RDW: 16.8 % — ABNORMAL HIGH (ref 11.0–15.0)
Total Lymphocyte: 29.9 %
WBC: 8.5 10*3/uL (ref 3.8–10.8)

## 2020-02-18 LAB — TEST AUTHORIZATION

## 2020-02-18 LAB — B12 AND FOLATE PANEL
Folate: 5.5 ng/mL
Vitamin B-12: 368 pg/mL (ref 200–1100)

## 2020-02-18 LAB — PATHOLOGIST SMEAR REVIEW

## 2020-02-23 ENCOUNTER — Ambulatory Visit: Payer: Medicare PPO | Admitting: Infectious Disease

## 2020-02-23 ENCOUNTER — Encounter: Payer: Self-pay | Admitting: Infectious Disease

## 2020-02-23 ENCOUNTER — Other Ambulatory Visit: Payer: Self-pay

## 2020-02-23 VITALS — BP 180/80 | HR 73 | Temp 98.1°F | Wt 179.0 lb

## 2020-02-23 DIAGNOSIS — R509 Fever, unspecified: Secondary | ICD-10-CM | POA: Diagnosis not present

## 2020-02-23 DIAGNOSIS — Z114 Encounter for screening for human immunodeficiency virus [HIV]: Secondary | ICD-10-CM

## 2020-02-23 DIAGNOSIS — G9340 Encephalopathy, unspecified: Secondary | ICD-10-CM | POA: Diagnosis not present

## 2020-02-23 DIAGNOSIS — G459 Transient cerebral ischemic attack, unspecified: Secondary | ICD-10-CM

## 2020-02-23 DIAGNOSIS — N1831 Chronic kidney disease, stage 3a: Secondary | ICD-10-CM

## 2020-02-23 NOTE — Progress Notes (Signed)
Reason for infectious disease consult: Fever of unknown origin  Requesting physician Dr. Buelah Manis  Subjective:    Patient ID: Sherry Kent, female    DOB: Sep 08, 1945, 74 y.o.   MRN: [id number]  HPI  Sherry Kent is a 74 year old Caucasian female with a past medical history significant for hypertension hyperlipidemia panic attacks TIA chronic kidney disease was admitted to the hospital in January with acute confusion slight temperature 101 and leukocytosis.  During that admission she was worked up for meningitis with a lumbar puncture that showed only 6-8 white blood cells with a predominant's of 73 to 80% neutrophils total protein of 63 and glucose of 118.  Culture some spinal fluid did not yield any organism she was initially treated with antibacterial therapy for bacterial meningitis with ampicillin ceftriaxone and vancomycin along with IV acyclovir.  B simplex PCR's were negative and she was continued on ceftriaxone.  Urine and blood cultures which are without growth as well.  Her incision confusion improved and she was ultimately discharged to home though the diagnosis underlying her acute onset of confusion and fevers was not determined.  She was readmitted again in May on the 20th when she again came in with acute confusion and fevers.  Work-up again was unremarkable with an unremarkable UA chest x-ray that was normal.  She underwent CT chest abdomen and pelvis that only showed some slight abnormalities involving the adrenal glands.  She had an MRI of the brain that was completely normal as well.  Been given antibiotics while in the hospital and improved and came back to baseline but without again anyone finding out what the cause of her symptoms were.  He did have an echocardiogram done in January which was negative for infection.  She apparently had another episode this weekend on Saturday where she was confused and not making sense when being asked questions by her family members.  She does live  alone and so these episodes of confusion of only been noted when family members have called her.  She states she has never been hospitalized in her life until January of this year.  She grew up in [place] and is only traveled along the land [place] having only remotely traveled to [place] many decades ago.  She is never exposed to someone with tuberculosis.  Not have any pets.  She does not have a history of IV drug use though she does smoke marijuana.      Past Medical History:  Diagnosis Date  . Anxiety   . H/O hematuria   . History of colon polyps   . Hyperlipidemia   . Hypertension     Past Surgical History:  Procedure Laterality Date  . APPENDECTOMY  8/07  . DILATION AND CURETTAGE OF UTERUS    . EYE SURGERY     bilat cataracts  . TONSILLECTOMY      Family History  Problem Relation Age of Onset  . Colon cancer Sister       Social History   Socioeconomic History  . Marital status: Married    Spouse name: Not on file  . Number of children: 0  . Years of education: Not on file  . Highest education level: Not on file  Occupational History  . 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 Smoking status: Former Smoker    Packs/day: 1.00    Years: 30.00    Pack years: 30.00    Types: Cigarettes    Quit date: 10/27/2003  Years since quitting: 16.3  . Smokeless tobacco: Never Used  Substance and Sexual Activity  . Alcohol use: Yes    Alcohol/week: 0.0 standard drinks    Comment: socially  . Drug use: No  . Sexual activity: Not on file  Other Topics Concern  . Not on file  Social History Narrative   Widowed. No children.   Retired.    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 Difficulty of Paying Living Expenses:   Food Insecurity:   . Worried About Charity fundraiser in the Last Year:   . Arboriculturist in  the Last Year:   Transportation Needs:   . Film/video editor (Medical):   Marland Kitchen Lack of Transportation (Non-Medical):   Physical Activity:   . Days of Exercise per Week:   . Minutes of Exercise per Session:   Stress:   . Feeling of Stress :   Social Connections:   . Frequency of Communication with Friends and Family:   . Frequency of Social Gatherings with Friends and Family:   . Attends Religious Services:   . Active Member of Clubs or Organizations:   . Attends Archivist Meetings:   Marland Kitchen Marital Status:     No Known Allergies   Current Outpatient Medications:  .  amLODipine (NORVASC) 10 MG tablet, Take 1 tablet (10 mg total) by mouth daily., Disp: 90 tablet, Rfl: 3 .  aspirin EC 81 MG tablet, Take 81 mg by mouth daily., Disp: , Rfl:  .  bisoprolol (ZEBETA) 10 MG tablet, Take 1 tablet (10 mg total) by mouth daily., Disp: 90 tablet, Rfl: 2 .  Calcium Carbonate-Vit D-Min (CALCIUM 1200 PO), Take 1 tablet by mouth daily. In divided doses , Disp: , Rfl:  .  furosemide (LASIX) 20 MG tablet, 1 tablet daily prn leg swelling, Disp: 30 tablet, Rfl: 3 .  lisinopril (ZESTRIL) 40 MG tablet, TAKE 1 TABLET(40 MG) BY MOUTH DAILY (Patient taking differently: Take 40 mg by mouth daily. ), Disp: 90 tablet, Rfl: 3 .  omeprazole (PRILOSEC) 20 MG capsule, Take 1 capsule (20 mg total) by mouth daily., Disp: 90 capsule, Rfl: 2 .  rosuvastatin (CRESTOR) 20 MG tablet, TAKE 1 TABLET(20 MG) BY MOUTH DAILY (Patient taking differently: Take 20 mg by mouth daily. ), Disp: 90 tablet, Rfl: 1 .  sertraline (ZOLOFT) 50 MG tablet, TAKE 1 TABLET(50 MG) BY MOUTH DAILY (Patient taking differently: Take 50 mg by mouth daily. ), Disp: 90 tablet, Rfl: 2 .  tiZANidine (ZANAFLEX) 4 MG tablet, Take 1 tablet (4 mg total) by mouth every 6 (six) hours as needed for muscle spasms., Disp: 20 tablet, Rfl: 0   Review of Systems  Constitutional: Negative for chills and fever.  HENT: Negative for congestion and sore throat.    Eyes: Negative for photophobia.  Respiratory: Negative for cough, shortness of breath and wheezing.   Cardiovascular: Negative for chest pain, palpitations and leg swelling.  Gastrointestinal: Negative for abdominal pain, blood in stool, constipation, diarrhea, nausea and vomiting.  Genitourinary: Negative for dysuria, flank pain and hematuria.  Musculoskeletal: Negative for back pain and myalgias.  Skin: Negative for rash.  Neurological: Negative for dizziness, weakness and headaches.  Hematological: Does not bruise/bleed easily.  Psychiatric/Behavioral: Positive for confusion. Negative for agitation, behavioral problems, dysphoric mood, hallucinations and suicidal ideas. The  patient is not hyperactive.        Objective:   Physical Exam Constitutional:      General: She is not in acute distress.    Appearance: She is not diaphoretic.  HENT:     Head: Normocephalic and atraumatic.     Right Ear: External ear normal.     Left Ear: External ear normal.     Nose: Nose normal.     Mouth/Throat:     Pharynx: No oropharyngeal exudate.  Eyes:     General: No scleral icterus.    Conjunctiva/sclera: Conjunctivae normal.     Pupils: Pupils are equal, round, and reactive to light.  Cardiovascular:     Rate and Rhythm: Normal rate and regular rhythm.     Heart sounds: Normal heart sounds. No murmur heard.  No friction rub. No gallop.   Pulmonary:     Effort: Pulmonary effort is normal. No respiratory distress.     Breath sounds: Normal breath sounds. No wheezing or rales.  Abdominal:     General: Bowel sounds are normal. There is no distension.     Palpations: Abdomen is soft.     Tenderness: There is no abdominal tenderness. There is no rebound.  Musculoskeletal:        General: No tenderness. Normal range of motion.     Cervical back: Normal range of motion and neck supple.  Lymphadenopathy:     Cervical: No cervical adenopathy.  Skin:    General: Skin is warm and dry.      Coloration: Skin is not pale.     Findings: No erythema or rash.  Neurological:     General: No focal deficit present.     Mental Status: She is alert. Mental status is at baseline. She is disoriented.     Coordination: Coordination normal.  Psychiatric:        Mood and Affect: Mood normal.        Behavior: Behavior normal.        Thought Content: Thought content normal.        Judgment: Judgment normal.           Assessment & Plan:  Fevers with confusion: Not clear at all what is going on with this patient.  Her work-up is been completely unremarkable so far  Get further FUO labs today that were not done yet.  She is getting an MRI of her abdomen that is upcoming.  She may benefit from a outpatient evaluation by a neurologist.  I am drawing some blood cultures as well as UA and cultures today although she does not have urinary symptoms and I do not think that is explanation for her symptomatology whatsoever.

## 2020-02-23 NOTE — Progress Notes (Signed)
crypto

## 2020-02-29 LAB — URINE CULTURE
MICRO NUMBER:: 10629809
Result:: NO GROWTH
SPECIMEN QUALITY:: ADEQUATE

## 2020-02-29 LAB — CULTURE, BLOOD (SINGLE)
MICRO NUMBER:: 10629706
MICRO NUMBER:: 10629707
Result:: NO GROWTH
Result:: NO GROWTH
SPECIMEN QUALITY:: ADEQUATE
SPECIMEN QUALITY:: ADEQUATE

## 2020-02-29 LAB — URINALYSIS, ROUTINE W REFLEX MICROSCOPIC
Bacteria, UA: NONE SEEN /HPF
Bilirubin Urine: NEGATIVE
Glucose, UA: NEGATIVE
Hyaline Cast: NONE SEEN /LPF
Ketones, ur: NEGATIVE
Leukocytes,Ua: NEGATIVE
Nitrite: NEGATIVE
Specific Gravity, Urine: 1.02 (ref 1.001–1.03)
pH: <=5 (ref 5.0–8.0)

## 2020-02-29 LAB — ANCA SCREEN W REFLEX TITER: ANCA Screen: NEGATIVE

## 2020-02-29 LAB — CRYPTOCOCCAL AG, LTX SCR RFLX TITER
Cryptococcal Ag Screen: NOT DETECTED
MICRO NUMBER:: 10628095
SPECIMEN QUALITY:: ADEQUATE

## 2020-02-29 LAB — PROTEIN ELECTROPHORESIS, SERUM
Albumin ELP: 4.2 g/dL (ref 3.8–4.8)
Alpha 1: 0.4 g/dL — ABNORMAL HIGH (ref 0.2–0.3)
Alpha 2: 0.9 g/dL (ref 0.5–0.9)
Beta 2: 0.3 g/dL (ref 0.2–0.5)
Beta Globulin: 0.5 g/dL (ref 0.4–0.6)
Gamma Globulin: 1 g/dL (ref 0.8–1.7)
Total Protein: 7.3 g/dL (ref 6.1–8.1)

## 2020-02-29 LAB — MVISTA BLASTOMYCES QNT AG, URINE
Interpretation: NEGATIVE
Result (ng/ml): NOT DETECTED ng/mL

## 2020-02-29 LAB — SEDIMENTATION RATE: Sed Rate: 31 mm/h — ABNORMAL HIGH (ref 0–30)

## 2020-02-29 LAB — C-REACTIVE PROTEIN: CRP: 6.7 mg/L (ref <8.0)

## 2020-02-29 LAB — HEPATITIS C ANTIBODY
Hepatitis C Ab: NONREACTIVE
SIGNAL TO CUT-OFF: 0.01 (ref <1.00)

## 2020-02-29 LAB — EPSTEIN-BARR VIRUS VCA ANTIBODY PANEL
EBV NA IgG: 542 U/mL — ABNORMAL HIGH
EBV VCA IgG: 536 U/mL — ABNORMAL HIGH
EBV VCA IgM: <36 U/mL

## 2020-02-29 LAB — HEPATITIS B SURFACE ANTIBODY,QUALITATIVE: Hep B S Ab: REACTIVE — AB

## 2020-02-29 LAB — ANGIOTENSIN CONVERTING ENZYME: Angiotensin-Converting Enzyme: <5 U/L — ABNORMAL LOW (ref 9–67)

## 2020-02-29 LAB — HIV ANTIBODY (ROUTINE TESTING W REFLEX): HIV 1&2 Ab, 4th Generation: NONREACTIVE

## 2020-02-29 LAB — RHEUMATOID FACTOR: Rheumatoid fact SerPl-aCnc: <14 IU/mL (ref <14)

## 2020-02-29 LAB — HISTOPLASMA ANTIGEN, URINE: Histoplasma Antigen, urine: <0.2

## 2020-02-29 LAB — CMV IGM: CMV IgM: 30.7 AU/mL — ABNORMAL HIGH

## 2020-02-29 LAB — CK: Total CK: 31 U/L (ref 29–143)

## 2020-02-29 LAB — CRYOGLOBULIN: Cryoglobulin, Qualitative Analysis: NOT DETECTED

## 2020-02-29 LAB — HEPATITIS B SURFACE ANTIGEN: Hepatitis B Surface Ag: NONREACTIVE

## 2020-02-29 LAB — CYTOMEGALOVIRUS ANTIBODY, IGG: Cytomegalovirus Ab-IgG: >10 U/mL — ABNORMAL HIGH

## 2020-02-29 LAB — FERRITIN: Ferritin: 20 ng/mL (ref 16–288)

## 2020-03-07 ENCOUNTER — Other Ambulatory Visit: Payer: Self-pay

## 2020-03-07 ENCOUNTER — Encounter: Payer: Self-pay | Admitting: Family Medicine

## 2020-03-07 ENCOUNTER — Ambulatory Visit (INDEPENDENT_AMBULATORY_CARE_PROVIDER_SITE_OTHER): Payer: Medicare PPO | Admitting: Family Medicine

## 2020-03-07 VITALS — BP 148/82 | HR 66 | Temp 98.2°F | Resp 14 | Ht 66.0 in | Wt 183.0 lb

## 2020-03-07 DIAGNOSIS — F325 Major depressive disorder, single episode, in full remission: Secondary | ICD-10-CM | POA: Diagnosis not present

## 2020-03-07 DIAGNOSIS — E559 Vitamin D deficiency, unspecified: Secondary | ICD-10-CM | POA: Diagnosis not present

## 2020-03-07 DIAGNOSIS — Z0001 Encounter for general adult medical examination with abnormal findings: Secondary | ICD-10-CM | POA: Diagnosis not present

## 2020-03-07 DIAGNOSIS — I10 Essential (primary) hypertension: Secondary | ICD-10-CM | POA: Diagnosis not present

## 2020-03-07 DIAGNOSIS — I6523 Occlusion and stenosis of bilateral carotid arteries: Secondary | ICD-10-CM

## 2020-03-07 DIAGNOSIS — E7800 Pure hypercholesterolemia, unspecified: Secondary | ICD-10-CM | POA: Diagnosis not present

## 2020-03-07 DIAGNOSIS — Z1211 Encounter for screening for malignant neoplasm of colon: Secondary | ICD-10-CM

## 2020-03-07 DIAGNOSIS — N1831 Chronic kidney disease, stage 3a: Secondary | ICD-10-CM

## 2020-03-07 DIAGNOSIS — Z0000 Encounter for general adult medical examination without abnormal findings: Secondary | ICD-10-CM

## 2020-03-07 DIAGNOSIS — R7302 Impaired glucose tolerance (oral): Secondary | ICD-10-CM

## 2020-03-07 DIAGNOSIS — M8589 Other specified disorders of bone density and structure, multiple sites: Secondary | ICD-10-CM | POA: Diagnosis not present

## 2020-03-07 MED ORDER — CENTRUM PO CHEW
1.0000 | CHEWABLE_TABLET | Freq: Every day | ORAL | Status: AC
Start: 2020-03-07 — End: ?

## 2020-03-07 NOTE — Progress Notes (Signed)
Subjective:   Patient presents for Medicare Annual/Subsequent preventive examination.     Pt had another confusion spell a few weeks ago, she had not had her meds, but then after taking meds it cleared up.   Seen by Infectious Disease of Fever of Unknown Origin work up, had labs done, no specific cause found just yet   Adrenal Adenoma- scheduled for this Month   HTN- no further swelling, no longer on lasix, taking norvasc, zetba, lisinopril   Hyperlipemia- taking Crestor   GERD- takes Omeprazole prn   GAD- zoloft 50mg  takes most days , help the mood some  No new concerns        Review Past Medical/Family/Social: PER EMR    Risk Factors  Current exercise habits: stays active walks part time  Dietary issues discussed: Yes  Cardiac risk factors: HTN  Depression Screen  (Note: if answer to either of the following is "Yes", a more complete depression screening is indicated)  Over the past two weeks, have you felt down, depressed or hopeless? No Over the past two weeks, have you felt little interest or pleasure in doing things? No Have you lost interest or pleasure in daily life? No Do you often feel hopeless? No Do you cry easily over simple problems? No   Activities of Daily Living  In your present state of health, do you have any difficulty performing the following activities?:  Driving? No  Managing money? No  Feeding yourself? No  Getting from bed to chair? No  Climbing a flight of stairs? No  Preparing food and eating?: No  Bathing or showering? No  Getting dressed: No  Getting to the toilet? No  Using the toilet:No  Moving around from place to place: No  In the past year have you fallen or had a near fall?:No  Are you sexually active? No  Do you have more than one partner? No   Hearing Difficulties: No  Do you often ask people to speak up or repeat themselves? No  Do you experience ringing or noises in your ears? No Do you have difficulty understanding  soft or whispered voices? No  Do you feel that you have a problem with memory? No Do you often misplace items? No  Do you feel safe at home? Yes  Cognitive Testing  Alert? Yes Normal Appearance?Yes  Oriented to person? Yes Place? Yes  Time? Yes  Recall of three objects? Yes  Can perform simple calculations? Yes  Displays appropriate judgment?Yes  Can read the correct time from a watch face?Yes   List the Names of Other Physician/Practitioners you currently use:   Infectious disease   Screening Tests / Date Colonoscopy  - Due                    Zostavax Declines  PNA- UTD  Influenza Vaccine  UTD Tetanus/tdap UTD  ROS GEN- denies fatigue, fever, weight loss,weakness, recent illness HEENT- denies eye drainage, change in vision, nasal discharge, CVS- denies chest pain, palpitations RESP- denies SOB, cough, wheeze ABD- denies N/V, change in stools, abd pain GU- denies dysuria, hematuria, dribbling, incontinence MSK- denies joint pain, muscle aches, injury Neuro- denies headache, dizziness, syncope, seizure activity  PHYSICAL: Vitals reviewed  GEN- NAD, alert and oriented x3 HEENT- PERRL, EOMI, non injected sclera, pink conjunctiva, MMM, oropharynx clear Neck- Supple, no thryomegaly CVS- RRR, no murmur RESP-CTAB ABD-NABS,soft,NT,ND Psych- normal affect and mood  NEURO-CNII-XII in tact no focal deficits  EXT- No edema Pulses-  Radial, DP- 2+   Assessment:    Annual wellness medicare exam   Plan:    During the course of the visit the patient was educated and counseled about appropriate screening and preventive services including:   CAGE/FALL/DEPRESSION Screen negative   HTN- controlled no changes    GERD- prn omeprazole   Carotid artery diease- in the setting of her "spells" will recheck carotid duplex  Colon cancer screening- referral to GI, they may consider her age and opt out  FUO pt to f/u with ID  Edema resolved  MDD/Anxiety- continue zoloft    Osteopenia- no further bone density- weight bearing exercise, continue vitamin D/Calcium   Glucose intolerace check A1C\  Discussed advanced directives, full code, she will review with daughter           Diet review for nutrition referral? Yes ____ Not Indicated __x__  Patient Instructions (the written plan) was given to the patient.  Medicare Attestation  I have personally reviewed:  The patient's medical and social history  Their use of alcohol, tobacco or illicit drugs  Their current medications and supplements  The patient's functional ability including ADLs,fall risks, home safety risks, cognitive, and hearing and visual impairment  Diet and physical activities  Evidence for depression or mood disorders  The patient's weight, height, BMI, and visual acuity have been recorded in the chart. I have made referrals, counseling, and provided education to the patient based on review of the above and I have provided the patient with a written personalized care plan for preventive services.

## 2020-03-07 NOTE — Patient Instructions (Addendum)
Schedule a follow up with Dr. Tommy Medal to review your labs  Referral TO GI - for colonoscopy We will call with lab results  Referral for carotid dupplex F/U 6 months

## 2020-03-08 LAB — CBC WITH DIFFERENTIAL/PLATELET
Absolute Monocytes: 704 cells/uL (ref 200–950)
Basophils Absolute: 97 cells/uL (ref 0–200)
Basophils Relative: 1.1 %
Eosinophils Absolute: 1074 cells/uL — ABNORMAL HIGH (ref 15–500)
Eosinophils Relative: 12.2 %
HCT: 37.6 % (ref 35.0–45.0)
Hemoglobin: 12.3 g/dL (ref 11.7–15.5)
Lymphs Abs: 2182 cells/uL (ref 850–3900)
MCH: 26.6 pg — ABNORMAL LOW (ref 27.0–33.0)
MCHC: 32.7 g/dL (ref 32.0–36.0)
MCV: 81.2 fL (ref 80.0–100.0)
MPV: 9.1 fL (ref 7.5–12.5)
Monocytes Relative: 8 %
Neutro Abs: 4743 cells/uL (ref 1500–7800)
Neutrophils Relative %: 53.9 %
Platelets: 327 10*3/uL (ref 140–400)
RBC: 4.63 10*6/uL (ref 3.80–5.10)
RDW: 17.3 % — ABNORMAL HIGH (ref 11.0–15.0)
Total Lymphocyte: 24.8 %
WBC: 8.8 10*3/uL (ref 3.8–10.8)

## 2020-03-08 LAB — LIPID PANEL
Cholesterol: 205 mg/dL — ABNORMAL HIGH (ref <200)
HDL: 41 mg/dL — ABNORMAL LOW (ref >=50)
LDL Cholesterol (Calc): 116 mg/dL (calc) — ABNORMAL HIGH
Non-HDL Cholesterol (Calc): 164 mg/dL (calc) — ABNORMAL HIGH (ref <130)
Total CHOL/HDL Ratio: 5 (calc) — ABNORMAL HIGH (ref <5.0)
Triglycerides: 336 mg/dL — ABNORMAL HIGH (ref <150)

## 2020-03-08 LAB — COMPREHENSIVE METABOLIC PANEL
AG Ratio: 1.5 (calc) (ref 1.0–2.5)
ALT: 12 U/L (ref 6–29)
AST: 13 U/L (ref 10–35)
Albumin: 4.5 g/dL (ref 3.6–5.1)
Alkaline phosphatase (APISO): 99 U/L (ref 37–153)
BUN/Creatinine Ratio: 15 (calc) (ref 6–22)
BUN: 20 mg/dL (ref 7–25)
CO2: 26 mmol/L (ref 20–32)
Calcium: 9.7 mg/dL (ref 8.6–10.4)
Chloride: 105 mmol/L (ref 98–110)
Creat: 1.34 mg/dL — ABNORMAL HIGH (ref 0.60–0.93)
Globulin: 3.1 g/dL (calc) (ref 1.9–3.7)
Glucose, Bld: 104 mg/dL — ABNORMAL HIGH (ref 65–99)
Potassium: 5.1 mmol/L (ref 3.5–5.3)
Sodium: 141 mmol/L (ref 135–146)
Total Bilirubin: 0.4 mg/dL (ref 0.2–1.2)
Total Protein: 7.6 g/dL (ref 6.1–8.1)

## 2020-03-08 LAB — HEMOGLOBIN A1C
Hgb A1c MFr Bld: 5.7 % of total Hgb — ABNORMAL HIGH (ref <5.7)
Mean Plasma Glucose: 117 (calc)
eAG (mmol/L): 6.5 (calc)

## 2020-03-08 LAB — VITAMIN D 25 HYDROXY (VIT D DEFICIENCY, FRACTURES): Vit D, 25-Hydroxy: 33 ng/mL (ref 30–100)

## 2020-03-09 ENCOUNTER — Other Ambulatory Visit: Payer: Self-pay | Admitting: *Deleted

## 2020-03-09 DIAGNOSIS — N179 Acute kidney failure, unspecified: Secondary | ICD-10-CM

## 2020-03-09 MED ORDER — HYDRALAZINE HCL 25 MG PO TABS
25.0000 mg | ORAL_TABLET | Freq: Two times a day (BID) | ORAL | 3 refills | Status: DC
Start: 2020-03-09 — End: 2020-07-05

## 2020-03-09 MED ORDER — OMEGA-3-ACID ETHYL ESTERS 1 G PO CAPS
1.0000 g | ORAL_CAPSULE | Freq: Two times a day (BID) | ORAL | 3 refills | Status: DC
Start: 2020-03-09 — End: 2020-07-05

## 2020-03-18 ENCOUNTER — Ambulatory Visit
Admission: RE | Admit: 2020-03-18 | Discharge: 2020-03-18 | Disposition: A | Discharge status: Home / Self Care | Payer: Medicare PPO | Source: Ambulatory Visit | Attending: Family Medicine | Admitting: Family Medicine

## 2020-03-18 ENCOUNTER — Other Ambulatory Visit: Payer: Self-pay

## 2020-03-18 DIAGNOSIS — E278 Other specified disorders of adrenal gland: Secondary | ICD-10-CM

## 2020-03-18 DIAGNOSIS — N2889 Other specified disorders of kidney and ureter: Secondary | ICD-10-CM | POA: Diagnosis not present

## 2020-03-18 DIAGNOSIS — D3501 Benign neoplasm of right adrenal gland: Secondary | ICD-10-CM | POA: Diagnosis not present

## 2020-03-18 DIAGNOSIS — R509 Fever, unspecified: Secondary | ICD-10-CM

## 2020-03-18 DIAGNOSIS — K5730 Diverticulosis of large intestine without perforation or abscess without bleeding: Secondary | ICD-10-CM | POA: Diagnosis not present

## 2020-03-18 DIAGNOSIS — N281 Cyst of kidney, acquired: Secondary | ICD-10-CM | POA: Diagnosis not present

## 2020-03-18 MED ORDER — GADOBENATE DIMEGLUMINE 529 MG/ML IV SOLN
17.0000 mL | Freq: Once | INTRAVENOUS | Status: AC | PRN
  Administered 2020-03-18: 17 mL via INTRAVENOUS

## 2020-03-26 ENCOUNTER — Other Ambulatory Visit: Payer: Self-pay | Admitting: Family Medicine

## 2020-03-26 DIAGNOSIS — C641 Malignant neoplasm of right kidney, except renal pelvis: Secondary | ICD-10-CM

## 2020-03-26 DIAGNOSIS — D3502 Benign neoplasm of left adrenal gland: Secondary | ICD-10-CM

## 2020-03-26 DIAGNOSIS — D3501 Benign neoplasm of right adrenal gland: Secondary | ICD-10-CM

## 2020-03-28 DIAGNOSIS — D4101 Neoplasm of uncertain behavior of right kidney: Secondary | ICD-10-CM | POA: Diagnosis not present

## 2020-03-28 DIAGNOSIS — D3501 Benign neoplasm of right adrenal gland: Secondary | ICD-10-CM | POA: Diagnosis not present

## 2020-03-28 DIAGNOSIS — D3502 Benign neoplasm of left adrenal gland: Secondary | ICD-10-CM | POA: Diagnosis not present

## 2020-04-25 ENCOUNTER — Other Ambulatory Visit: Payer: Self-pay | Admitting: Family Medicine

## 2020-05-03 ENCOUNTER — Ambulatory Visit: Payer: Medicare PPO | Admitting: Infectious Disease

## 2020-05-04 ENCOUNTER — Telehealth: Payer: Self-pay

## 2020-05-04 ENCOUNTER — Other Ambulatory Visit: Payer: Self-pay

## 2020-05-04 ENCOUNTER — Encounter: Payer: Self-pay | Admitting: Infectious Disease

## 2020-05-04 ENCOUNTER — Telehealth (INDEPENDENT_AMBULATORY_CARE_PROVIDER_SITE_OTHER): Payer: Medicare PPO | Admitting: Infectious Disease

## 2020-05-04 DIAGNOSIS — C641 Malignant neoplasm of right kidney, except renal pelvis: Secondary | ICD-10-CM | POA: Diagnosis not present

## 2020-05-04 DIAGNOSIS — R509 Fever, unspecified: Secondary | ICD-10-CM

## 2020-05-04 DIAGNOSIS — N2889 Other specified disorders of kidney and ureter: Secondary | ICD-10-CM | POA: Insufficient documentation

## 2020-05-04 DIAGNOSIS — R410 Disorientation, unspecified: Secondary | ICD-10-CM | POA: Diagnosis not present

## 2020-05-04 DIAGNOSIS — C649 Malignant neoplasm of unspecified kidney, except renal pelvis: Secondary | ICD-10-CM

## 2020-05-04 HISTORY — DX: Malignant neoplasm of unspecified kidney, except renal pelvis: C64.9

## 2020-05-04 NOTE — Progress Notes (Signed)
Virtual Visit via Telephone Note  I connected with Sherry Kent on 05/04/20 at 10:00 AM EDT by telephone and verified that I am speaking with the correct person using two identifiers.  Location: Patient: Home Provider: My Home   I discussed the limitations, risks, security and privacy concerns of performing an evaluation and management service by telephone and the availability of in person appointments. I also discussed with the patient that there may be a patient responsible charge related to this service. The patient expressed understanding and agreed to proceed.  Chief complaint; followup for FUO History of Present Illness:   74 year old Caucasian female with a past medical history significant for hypertension hyperlipidemia panic attacks TIA chronic kidney disease was admitted to the hospital in January 2021  with acute confusion slight temperature 101 and leukocytosis.  During that admission she was worked up for meningitis with a lumbar puncture that showed only 6-8 white blood cells with a predominant's of 73 to 80% neutrophils total protein of 63 and glucose of 118.  Culture some spinal fluid did not yield any organism she was initially treated with antibacterial therapy for bacterial meningitis with ampicillin ceftriaxone and vancomycin along with IV acyclovir.  B simplex PCR's were negative and she was continued on ceftriaxone.  Urine and blood cultures which are without growth as well.  Her incision confusion improved and she was ultimately discharged to home though the diagnosis underlying her acute onset of confusion and fevers was not determined.  She was readmitted again in May on the 20th when she again came in with acute confusion and fevers.  Work-up again was unremarkable with an unremarkable UA chest x-ray that was normal.  She underwent CT chest abdomen and pelvis that only showed some slight abnormalities involving the adrenal glands.  She had an MRI of the brain that was  completely normal as well.  Been given antibiotics while in the hospital and improved and came back to baseline but without again anyone finding out what the cause of her symptoms were.  He did have an echocardiogram done in January which was negative for infection.  She apparently had another episode prior to visit with me when she confused and not making sense when being asked questions by her family members.  We did further workup which was unrevealing.  HOWEVER SINCE THEN IN July SHE HAD MRI OF THE ABDOMEN THAT SHOWS EVIDENCE OF RENAL CELL CARCINOMA IN RIGHT SIDE.  HER PCP DR. Dorian Heckle OFFICE WHO ORDERED TEST CALLED PT AND PER NOTES MADE HER AWARE ([place] CLAIMED TO NOT HAVE BEEN INFORMED, or at least she did not understand) Urgent referral to Urology was made at that time --and I made another one today not knowing one had been made.  She has had no fevers or episodes of confusion since I saw her.  However if she has renal cell carcinoma that could COMPLETELY explain her FUO and perhaps confusion coming from fevers themselves.   It is imperative that she see Urology as soon as possible  ROS 12 ROS is otherwise negative   Past Medical History:  Diagnosis Date  . Anxiety   . H/O hematuria   . History of colon polyps   . Hyperlipidemia   . Hypertension   . Renal cell carcinoma ([place]) 05/04/2020    Past Surgical History:  Procedure Laterality Date  . APPENDECTOMY  8/07  . DILATION AND CURETTAGE OF UTERUS    . EYE SURGERY     bilat  cataracts  . TONSILLECTOMY      Family History  Problem Relation Age of Onset  . Colon cancer Sister       Social History   Socioeconomic History  . Marital status: Married    Spouse name: Not on file  . Number of children: 0  . Years of education: Not on file  . Highest education level: Not on file  Occupational History  . 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 Smoking status: Former Smoker    Packs/day: 1.00     Years: 30.00    Pack years: 30.00    Types: Cigarettes    Quit date: 10/27/2003    Years since quitting: 16.5  . Smokeless tobacco: Never Used  Substance and Sexual Activity  . Alcohol use: Yes    Alcohol/week: 0.0 standard drinks    Comment: socially  . Drug use: No  . Sexual activity: Not on file  Other Topics Concern  . Not on file  Social History Narrative   Widowed. No children.   Retired.    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 Difficulty of Paying Living Expenses: Not on file  Food Insecurity:   . Worried About Charity fundraiser in the Last Year: Not on file  . Ran Out of Food in the Last Year: Not on file  Transportation Needs:   . Lack of Transportation (Medical): Not on file  . Lack of Transportation (Non-Medical): Not on file  Physical Activity:   . Days of Exercise per Week: Not on file  . Minutes of Exercise per Session: Not on file  Stress:   . Feeling of Stress : Not on file  Social Connections:   . Frequency of Communication with Friends and Family: Not on file  . Frequency of Social Gatherings with Friends and Family: Not on file  . Attends Religious Services: Not on file  . Active Member of Clubs or Organizations: Not on file  . Attends Archivist Meetings: Not on file  . Marital Status: Not on file    No Known Allergies   Current Outpatient Medications:  .  amLODipine (NORVASC) 10 MG tablet, Take 1 tablet (10 mg total) by mouth daily., Disp: 90 tablet, Rfl: 3 .  aspirin EC 81 MG tablet, Take 81 mg by mouth daily., Disp: , Rfl:  .  bisoprolol (ZEBETA) 10 MG tablet, Take 1 tablet (10 mg total) by mouth daily., Disp: 90 tablet, Rfl: 2 .  Calcium Carbonate-Vit D-Min (CALCIUM 1200 PO), Take 1 tablet by mouth daily. In divided doses , Disp: , Rfl:  .  furosemide (LASIX) 20 MG tablet, 1 tablet daily prn leg  swelling, Disp: 30 tablet, Rfl: 3 .  hydrALAZINE (APRESOLINE) 25 MG tablet, Take 1 tablet (25 mg total) by mouth in the morning and at bedtime., Disp: 60 tablet, Rfl: 3 .  meloxicam (MOBIC) 7.5 MG tablet, TAKE 1 TABLET(7.5 MG) BY MOUTH DAILY, Disp: 30 tablet, Rfl: 3 .  multivitamin-iron-minerals-folic acid (CENTRUM) chewable tablet, Chew 1 tablet by mouth daily., Disp: , Rfl:  .  omega-3 acid ethyl esters (LOVAZA) 1 g capsule, Take 1 capsule (1 g total) by mouth 2 (two) times daily., Disp: 60 capsule, Rfl: 3 .  omeprazole (PRILOSEC) 20 MG capsule, Take 1 capsule (20 mg total) by mouth daily., Disp:  90 capsule, Rfl: 2 .  rosuvastatin (CRESTOR) 20 MG tablet, TAKE 1 TABLET(20 MG) BY MOUTH DAILY (Patient taking differently: Take 20 mg by mouth daily. ), Disp: 90 tablet, Rfl: 1 .  sertraline (ZOLOFT) 50 MG tablet, TAKE 1 TABLET(50 MG) BY MOUTH DAILY (Patient taking differently: Take 50 mg by mouth daily. ), Disp: 90 tablet, Rfl: 2 .  tiZANidine (ZANAFLEX) 4 MG tablet, Take 1 tablet (4 mg total) by mouth every 6 (six) hours as needed for muscle spasms., Disp: 20 tablet, Rfl: 0     Observations/Objective:  She seems to be doing well though I was disturbed that she had not been seen by Urology and seemed completely unaware of her MRI findings  Assessment and Plan:  Renal cell carcinoma: this would be explanation for her FUO. I made another referral to Urology today urgently  FUO: due to #1  Confusion; likely due to fevers  Follow Up Instructions:    I discussed the assessment and treatment plan with the patient. The patient was provided an opportunity to ask questions and all were answered. The patient agreed with the plan and demonstrated an understanding of the instructions.   The patient was advised to call back or seek an in-person evaluation if the symptoms worsen or if the condition fails to improve as anticipated.  I provided  21 minutes of non-face-to-face time during this  encounter.   Alcide Evener, MD

## 2020-05-04 NOTE — Telephone Encounter (Signed)
New Urgent patient referral noted. Front desk alerted.  Sherry Kent

## 2020-05-04 NOTE — Telephone Encounter (Signed)
Patient has recently been seen by Urinologist and office wanted to know why patient needed another referral. Per Dr. Tommy Medal patient is having syncopal episodes and fevers which MD relates to Renal Cell Carcinoma.  Offered triage Dr. Derek Mound phone number to connect with Urologist. Per Mickel Baas with triage team she will have MD review notes and call patient with sooner appointment.  Eugenia Mcalpine

## 2020-05-04 NOTE — Telephone Encounter (Signed)
-----   Message from Truman Hayward, MD sent at 05/04/2020 11:18 AM EDT ----- Pt needs to be seen by [facility] for renal cell ca which explains her fevers. Joycelyn Schmid can you make sure that [facility] sees her. Dr Dorian Heckle office ALREADY made referral shortly after finding in July but she needs to be seen asap

## 2020-05-05 NOTE — Telephone Encounter (Signed)
Yes I would be happy to talk with Urology and I do think that the fevers and syncopal evetns were coming from an undiagnosed RCC

## 2020-05-09 NOTE — Telephone Encounter (Signed)
Ok excellent

## 2020-05-09 NOTE — Telephone Encounter (Signed)
Patient is scheduled for 9/14 at 9:45am

## 2020-05-16 DIAGNOSIS — D4101 Neoplasm of uncertain behavior of right kidney: Secondary | ICD-10-CM | POA: Diagnosis not present

## 2020-05-16 DIAGNOSIS — D3501 Benign neoplasm of right adrenal gland: Secondary | ICD-10-CM | POA: Diagnosis not present

## 2020-05-16 DIAGNOSIS — D3502 Benign neoplasm of left adrenal gland: Secondary | ICD-10-CM | POA: Diagnosis not present

## 2020-06-16 ENCOUNTER — Other Ambulatory Visit: Payer: Self-pay | Admitting: Family Medicine

## 2020-07-05 ENCOUNTER — Other Ambulatory Visit: Payer: Self-pay | Admitting: Family Medicine

## 2020-08-07 ENCOUNTER — Ambulatory Visit: Payer: Medicare PPO | Admitting: Family Medicine

## 2020-08-07 ENCOUNTER — Other Ambulatory Visit: Payer: Self-pay

## 2020-08-07 VITALS — BP 136/74 | HR 73 | Temp 97.9°F | Resp 15 | Ht 66.0 in | Wt 170.0 lb

## 2020-08-07 DIAGNOSIS — Z23 Encounter for immunization: Secondary | ICD-10-CM | POA: Diagnosis not present

## 2020-08-07 DIAGNOSIS — M109 Gout, unspecified: Secondary | ICD-10-CM

## 2020-08-07 MED ORDER — PREDNISONE 20 MG PO TABS: ORAL_TABLET | ORAL | 0 refills | Status: DC

## 2020-08-07 MED ORDER — CEPHALEXIN 500 MG PO CAPS: 500.0000 mg | ORAL_CAPSULE | Freq: Three times a day (TID) | ORAL | 0 refills | Status: DC

## 2020-08-07 NOTE — Progress Notes (Signed)
Subjective:    Patient ID: Sherry Kent, female    DOB: [birth year], 74 y.o.   MRN: [id number]  HPI  Patient is a very pleasant 74 year old Caucasian female who presents today with a 2-day history of acute pain in her right great toe.  The first MTP joint is red hot swollen and painful.  The patient denies any falls or injuries.  She states that it began suddenly.  She denies any fevers or chills however it hurts simply to touch the skin.  Differential diagnosis is cellulitis versus podagra however the pain seems to primarily be in the first MTP joint so I favor podagra.  She does have a history of chronic kidney disease based on her most recent creatinine and she is on Lasix.   Past Medical History:  Diagnosis Date  . Anxiety   . H/O hematuria   . History of colon polyps   . Hyperlipidemia   . Hypertension   . Renal cell carcinoma ([place]) 05/04/2020   Past Surgical History:  Procedure Laterality Date  . APPENDECTOMY  8/07  . DILATION AND CURETTAGE OF UTERUS    . EYE SURGERY     bilat cataracts  . TONSILLECTOMY     Current Outpatient Medications on File Prior to Visit  Medication Sig Dispense Refill  . amLODipine (NORVASC) 10 MG tablet Take 1 tablet (10 mg total) by mouth daily. 90 tablet 3  . aspirin EC 81 MG tablet Take 81 mg by mouth daily.    . bisoprolol (ZEBETA) 10 MG tablet Take 1 tablet (10 mg total) by mouth daily. 90 tablet 2  . Calcium Carbonate-Vit D-Min (CALCIUM 1200 PO) Take 1 tablet by mouth daily. In divided doses     . furosemide (LASIX) 20 MG tablet TAKE 1 TABLET BY MOUTH DAILY AS NEEDED FOR LEG SWELLING 30 tablet 3  . hydrALAZINE (APRESOLINE) 25 MG tablet TAKE 1 TABLET(25 MG) BY MOUTH IN THE MORNING AND AT BEDTIME 60 tablet 3  . multivitamin-iron-minerals-folic acid (CENTRUM) chewable tablet Chew 1 tablet by mouth daily.    Marland Kitchen omega-3 acid ethyl esters (LOVAZA) 1 g capsule TAKE 1 CAPSULE BY MOUTH TWICE A DAY 60 capsule 3  . omeprazole (PRILOSEC) 20 MG capsule Take  1 capsule (20 mg total) by mouth daily. 90 capsule 2  . rosuvastatin (CRESTOR) 20 MG tablet TAKE 1 TABLET(20 MG) BY MOUTH DAILY (Patient taking differently: Take 20 mg by mouth daily. ) 90 tablet 1  . sertraline (ZOLOFT) 50 MG tablet TAKE 1 TABLET(50 MG) BY MOUTH DAILY (Patient taking differently: Take 50 mg by mouth daily. ) 90 tablet 2  . meloxicam (MOBIC) 7.5 MG tablet TAKE 1 TABLET(7.5 MG) BY MOUTH DAILY (Patient not taking: Reported on 08/07/2020) 30 tablet 3  . tiZANidine (ZANAFLEX) 4 MG tablet Take 1 tablet (4 mg total) by mouth every 6 (six) hours as needed for muscle spasms. (Patient not taking: Reported on 08/07/2020) 20 tablet 0   No current facility-administered medications on file prior to visit.   No Known Allergies Social History   Socioeconomic History  . Marital status: Married    Spouse name: Not on file  . Number of children: 0  . Years of education: Not on file  . Highest education level: Not on file  Occupational History  . 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 Smoking status: Former Smoker    Packs/day: 1.00    Years: 30.00    Pack years: 30.00  Types: Cigarettes    Quit date: 10/27/2003    Years since quitting: 16.7  . Smokeless tobacco: Never Used  Substance and Sexual Activity  . Alcohol use: Yes    Alcohol/week: 0.0 standard drinks    Comment: socially  . Drug use: No  . Sexual activity: Not on file  Other Topics Concern  . Not on file  Social History Narrative   Widowed. No children.   Retired.    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 Difficulty of Paying Living Expenses: Not on file  Food Insecurity:   . Worried About Charity fundraiser in the Last Year: Not on file  . Ran Out of Food in the Last Year: Not on file  Transportation Needs:   . Lack of Transportation (Medical): Not on file  . Lack  of Transportation (Non-Medical): Not on file  Physical Activity:   . Days of Exercise per Week: Not on file  . Minutes of Exercise per Session: Not on file  Stress:   . Feeling of Stress : Not on file  Social Connections:   . Frequency of Communication with Friends and Family: Not on file  . Frequency of Social Gatherings with Friends and Family: Not on file  . Attends Religious Services: Not on file  . Active Member of Clubs or Organizations: Not on file  . Attends Archivist Meetings: Not on file  . Marital Status: Not on file  Intimate Partner Violence:   . Fear of Current or Ex-Partner: Not on file  . Emotionally Abused: Not on file  . Physically Abused: Not on file  . Sexually Abused: Not on file     Review of Systems  All other systems reviewed and are negative.      Objective:   Physical Exam Vitals reviewed.  Constitutional:      Appearance: Normal appearance.  Cardiovascular:     Rate and Rhythm: Normal rate and regular rhythm.     Heart sounds: Normal heart sounds.  Pulmonary:     Effort: Pulmonary effort is normal.     Breath sounds: Normal breath sounds.  Musculoskeletal:     Right foot: Decreased range of motion.       Feet:  Feet:     Right foot:     Skin integrity: Erythema and warmth present.  Neurological:     Mental Status: She is alert.           Assessment & Plan:  Need for prophylactic vaccination and inoculation against influenza - Plan: Flu Vaccine QUAD High Dose(Fluad)  Podagra - Plan: CBC with Differential/Platelet, BASIC METABOLIC PANEL WITH GFR, Uric acid, predniSONE (DELTASONE) 20 MG tablet  I suspect podagra.  Begin prednisone taper pack.  Check uric acid, CBC, CMP.  If uric acid level is normal, consider switching treatment to Keflex for cellulitis.  Discussed the risk and benefits of taking prednisone for gout attack and patient is comfortable trying the prednisone based on the pain that she is feeling in the joint.

## 2020-08-08 LAB — CBC WITH DIFFERENTIAL/PLATELET
Absolute Monocytes: 634 cells/uL (ref 200–950)
Basophils Absolute: 62 cells/uL (ref 0–200)
Basophils Relative: 0.6 %
Eosinophils Absolute: 198 cells/uL (ref 15–500)
Eosinophils Relative: 1.9 %
HCT: 38.7 % (ref 35.0–45.0)
Hemoglobin: 12.2 g/dL (ref 11.7–15.5)
Lymphs Abs: 2631 cells/uL (ref 850–3900)
MCH: 25.6 pg — ABNORMAL LOW (ref 27.0–33.0)
MCHC: 31.5 g/dL — ABNORMAL LOW (ref 32.0–36.0)
MCV: 81.3 fL (ref 80.0–100.0)
MPV: 9.7 fL (ref 7.5–12.5)
Monocytes Relative: 6.1 %
Neutro Abs: 6874 cells/uL (ref 1500–7800)
Neutrophils Relative %: 66.1 %
Platelets: 365 10*3/uL (ref 140–400)
RBC: 4.76 10*6/uL (ref 3.80–5.10)
RDW: 17.5 % — ABNORMAL HIGH (ref 11.0–15.0)
Total Lymphocyte: 25.3 %
WBC: 10.4 10*3/uL (ref 3.8–10.8)

## 2020-08-08 LAB — BASIC METABOLIC PANEL WITH GFR
BUN/Creatinine Ratio: 12 (calc) (ref 6–22)
BUN: 14 mg/dL (ref 7–25)
CO2: 24 mmol/L (ref 20–32)
Calcium: 9.8 mg/dL (ref 8.6–10.4)
Chloride: 105 mmol/L (ref 98–110)
Creat: 1.13 mg/dL — ABNORMAL HIGH (ref 0.60–0.93)
GFR, Est African American: 55 mL/min/{1.73_m2} — ABNORMAL LOW (ref >=60)
GFR, Est Non African American: 48 mL/min/{1.73_m2} — ABNORMAL LOW (ref >=60)
Glucose, Bld: 109 mg/dL — ABNORMAL HIGH (ref 65–99)
Potassium: 4.8 mmol/L (ref 3.5–5.3)
Sodium: 140 mmol/L (ref 135–146)

## 2020-08-08 LAB — URIC ACID: Uric Acid, Serum: 7.6 mg/dL — ABNORMAL HIGH (ref 2.5–7.0)

## 2020-08-25 ENCOUNTER — Other Ambulatory Visit: Payer: Self-pay | Admitting: Family Medicine

## 2020-08-26 ENCOUNTER — Other Ambulatory Visit: Payer: Self-pay | Admitting: Family Medicine

## 2020-08-28 NOTE — Telephone Encounter (Signed)
Ok to refill 

## 2020-09-18 ENCOUNTER — Ambulatory Visit: Payer: Medicare PPO | Admitting: Family Medicine

## 2020-09-25 ENCOUNTER — Other Ambulatory Visit: Payer: Self-pay | Admitting: Family Medicine

## 2020-09-26 ENCOUNTER — Encounter: Payer: Self-pay | Admitting: Family Medicine

## 2020-09-26 ENCOUNTER — Other Ambulatory Visit: Payer: Self-pay

## 2020-09-26 ENCOUNTER — Ambulatory Visit (INDEPENDENT_AMBULATORY_CARE_PROVIDER_SITE_OTHER): Payer: Medicare PPO | Admitting: Family Medicine

## 2020-09-26 VITALS — BP 138/82 | HR 72 | Temp 98.2°F | Resp 14 | Ht 66.0 in | Wt 166.0 lb

## 2020-09-26 DIAGNOSIS — M25552 Pain in left hip: Secondary | ICD-10-CM

## 2020-09-26 DIAGNOSIS — M5442 Lumbago with sciatica, left side: Secondary | ICD-10-CM | POA: Diagnosis not present

## 2020-09-26 MED ORDER — METHYLPREDNISOLONE 4 MG PO TBPK: ORAL_TABLET | ORAL | 0 refills | Status: DC

## 2020-09-26 MED ORDER — TRAMADOL HCL 50 MG PO TABS: ORAL_TABLET | ORAL | 0 refills | Status: DC

## 2020-09-26 NOTE — Progress Notes (Signed)
   Subjective:    Patient ID: Sherry Kent, female    DOB: 1945-10-11, 75 y.o.   MRN: [id number]  Patient presents for Back Pain (X3 weeks- L sided back pain-- no known injury)  Patient here with left-sided back pain that radiates into her buttocks and the hip she has a tingling and numb-like sensation on the thigh.  This is been going on for the past 3 weeks.  Patient turns or moves a certain way she will have more severe pain.  No change in bowel or bladder no tingling or numbness in the feet.  No known particular injury.  She does have a renal lesion which is thought to be adenoma however did have appearance of renal carcinoma on MRI.  She has been evaluated by urology and they are now following her yearly with imaging.  She is not had any falls  No fever or recent spells like her previous confusion episodes. She has been taking Tylenol.  She used Zanaflex couple times but it did not help.  Review Of Systems:  GEN- denies fatigue, fever, weight loss,weakness, recent illness HEENT- denies eye drainage, change in vision, nasal discharge, CVS- denies chest pain, palpitations RESP- denies SOB, cough, wheeze ABD- denies N/V, change in stools, abd pain GU- denies dysuria, hematuria, dribbling, incontinence MSK- + joint pain, muscle aches, injury Neuro- denies headache, dizziness, syncope, seizure activity       Objective:    BP 138/82   Pulse 72   Temp 98.2 F (36.8 C) (Temporal)   Resp 14   Ht 5\' 6"  (1.676 m)   Wt 166 lb (75.3 kg)   SpO2 95%   BMI 26.79 kg/m  GEN- NAD, alert and oriented x3 CVS- RRR, no murmur RESP-CTAB ABD-NABS,soft,NT,ND MSK TTP lumbar and left lower lumbar region/paraspinals, +SLR LEFT SIDE, FAIR rom SPINE/HIPS/KNEES NEURO- antalgic gait, sensation grossly in tact, strength decreased left compared to right 2/2 pain, DTR symmetric  EXT- No edema Pulses- Radial, DP- 2+        Assessment & Plan:      Problem List Items Addressed This Visit   None    Visit Diagnoses    Acute left-sided low back pain with left-sided sciatica    -  Primary   Obtain xray with her renal lesion history. Sciatica irritation on exam, given medrol dose pak, ultram, epson salt, discussed red flags   Relevant Medications   methylPREDNISolone (MEDROL DOSEPAK) 4 MG TBPK tablet   traMADol (ULTRAM) 50 MG tablet   Other Relevant Orders   DG Lumbar Spine Complete   DG HIP UNILAT WITH PELVIS 2-3 VIEWS LEFT   Left hip pain       Relevant Orders   DG HIP UNILAT WITH PELVIS 2-3 VIEWS LEFT      Note: This dictation was prepared with Dragon dictation along with smaller phrase technology. Any transcriptional errors that result from this process are unintentional.  \

## 2020-09-26 NOTE — Patient Instructions (Signed)
[facility]  For XRAY  [address] Try epsom salt soak Steroids Pain meds as needed

## 2020-10-05 ENCOUNTER — Other Ambulatory Visit: Payer: Self-pay | Admitting: Family Medicine

## 2020-10-05 NOTE — Telephone Encounter (Signed)
Ok to refill??  Last office visit/ refill 09/26/2020. 

## 2020-10-11 ENCOUNTER — Other Ambulatory Visit: Payer: Self-pay | Admitting: Family Medicine

## 2020-10-12 NOTE — Telephone Encounter (Signed)
Call pt, she was supposed to have xrays of back and hip I have been receiving refill request for tramadol We need to figure out what is going on if she is still requiring pain meds  She can either go get the xrays or I can send her directly to ortho and let them xray her

## 2020-10-12 NOTE — Telephone Encounter (Signed)
Ok to refill??  Last office visit 09/26/2020.  Last refill 10/05/2020.

## 2020-10-13 ENCOUNTER — Other Ambulatory Visit: Payer: Self-pay | Admitting: Family Medicine

## 2020-10-13 MED ORDER — TRAMADOL HCL 50 MG PO TABS: ORAL_TABLET | ORAL | 0 refills | Status: DC

## 2020-10-13 NOTE — Telephone Encounter (Signed)
Call placed to patient.   Patient reports that she has been out of the state due to a death in the family. Reports that she is going to have X-rays on Monday.   States that she continues to have pain. Reports that she can get around 4-5 hours of relief from Tramadol. Please advise.

## 2020-10-13 NOTE — Telephone Encounter (Signed)
Call placed to patient. LMTRC.  

## 2020-10-13 NOTE — Telephone Encounter (Signed)
Noted , I will send over another refill

## 2020-10-13 NOTE — Addendum Note (Signed)
Addended by: Vic Blackbird F on: 10/13/2020 04:48 PM   Modules accepted: Orders

## 2020-11-10 ENCOUNTER — Other Ambulatory Visit: Payer: Self-pay | Admitting: Family Medicine

## 2020-11-11 ENCOUNTER — Other Ambulatory Visit: Payer: Self-pay | Admitting: Family Medicine

## 2020-11-21 ENCOUNTER — Other Ambulatory Visit: Payer: Self-pay | Admitting: Family Medicine

## 2021-01-17 IMAGING — CT CT CERVICAL SPINE W/O CM
3 of 8 series · 10 of 33 positions shown, 11 images · non-contrast
Comparison: MR brain and CT head dated December 11, 2011.

CLINICAL DATA: Found down in the shower.  Altered mental status.

EXAM:
CT HEAD WITHOUT CONTRAST
CT CERVICAL SPINE WITHOUT CONTRAST
TECHNIQUE: Multidetector CT imaging of the head and cervical spine was
performed following the standard protocol without intravenous
contrast. Multiplanar CT image reconstructions of the cervical spine
were also generated.

[Series 8: c_spine 2.0 sag bone · sagittal · 0.21mm/px · 5 of 61 slices shown]
[im 11/61  bone]
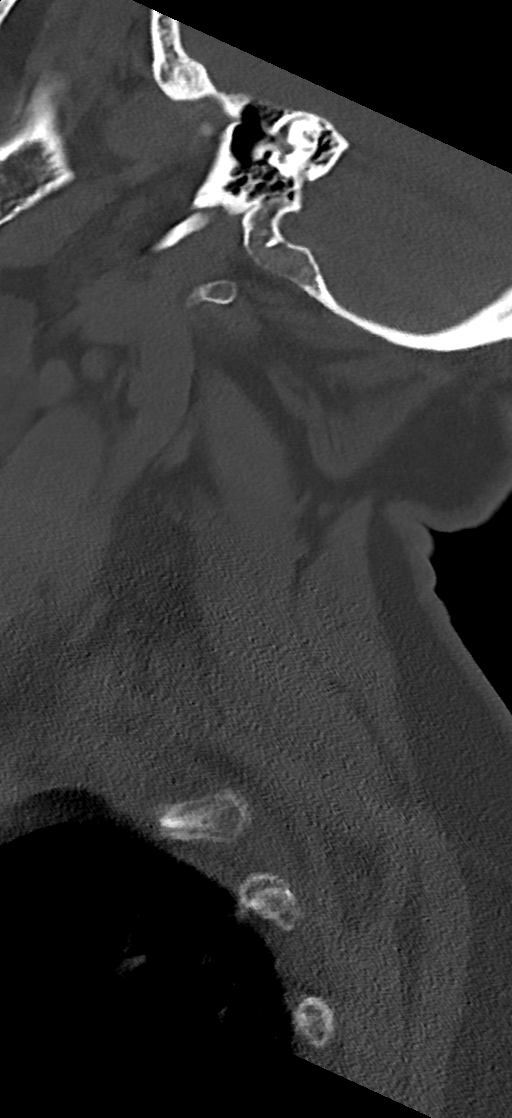
[im 21/61  bone]
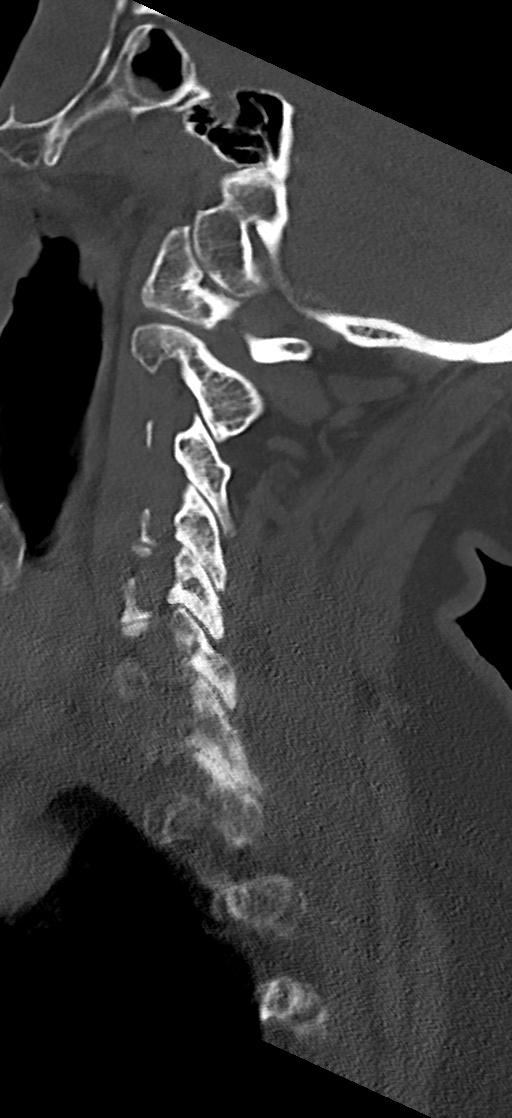
[im 31/61  bone]
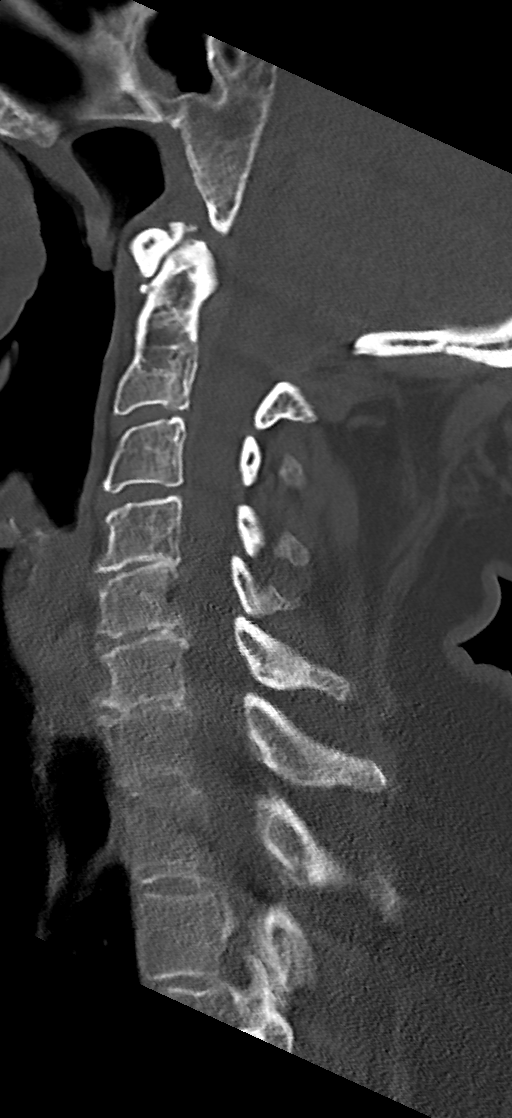
[im 41/61  bone]
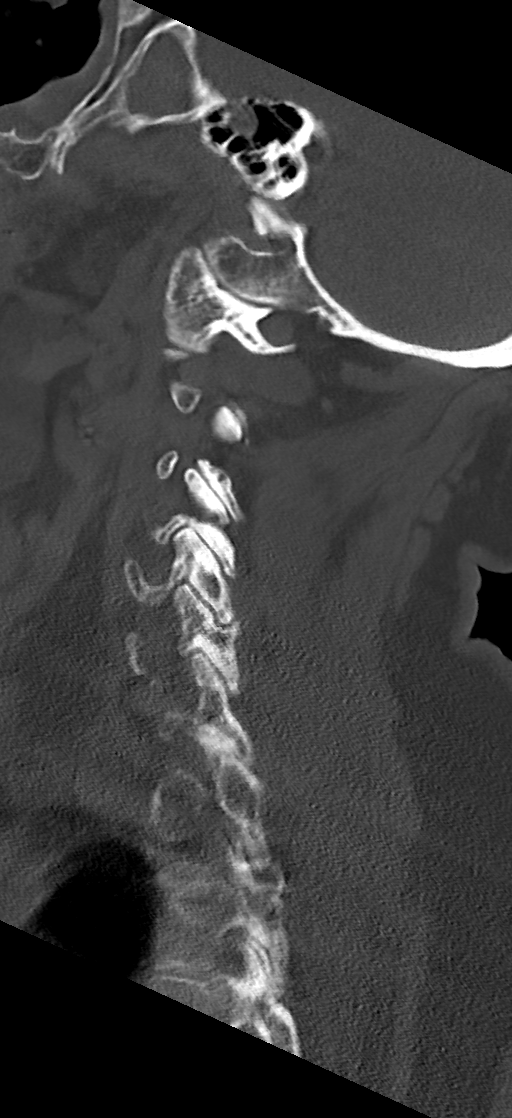
[im 51/61  bone]
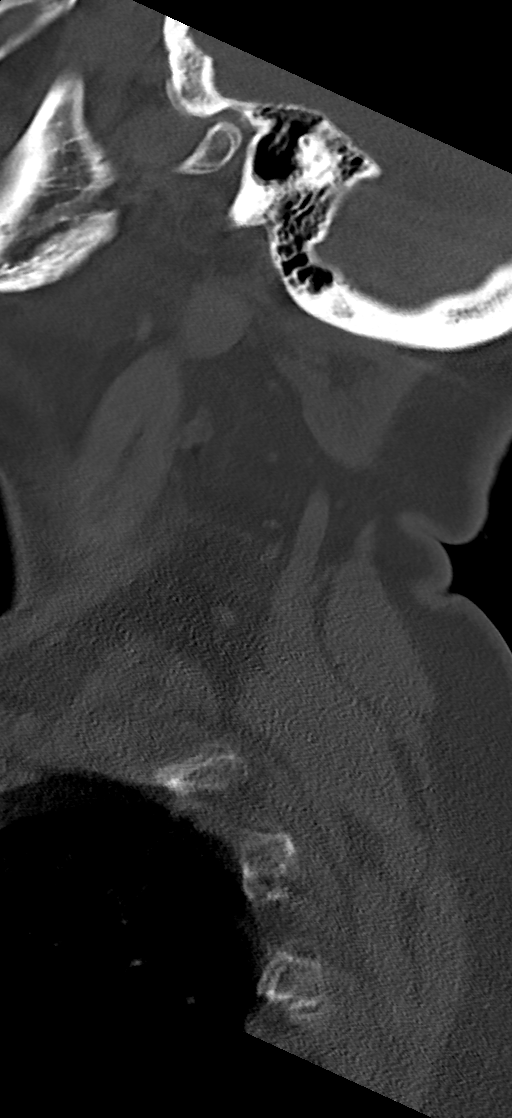

[Series 9: c_spine 2.0 cor bone · coronal · 0.23mm/px · 3 of 55 slices shown]
[im 14/55  bone]
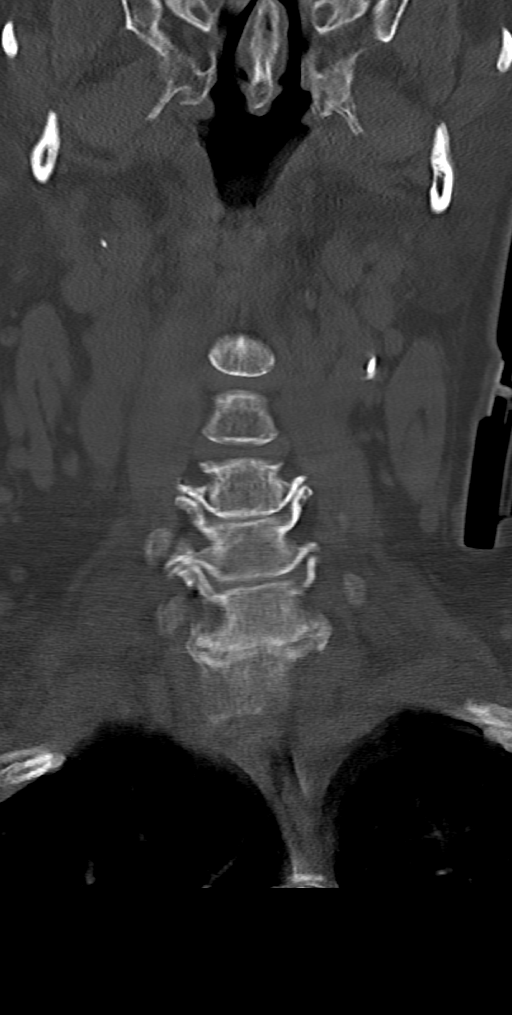
[im 28/55  bone]
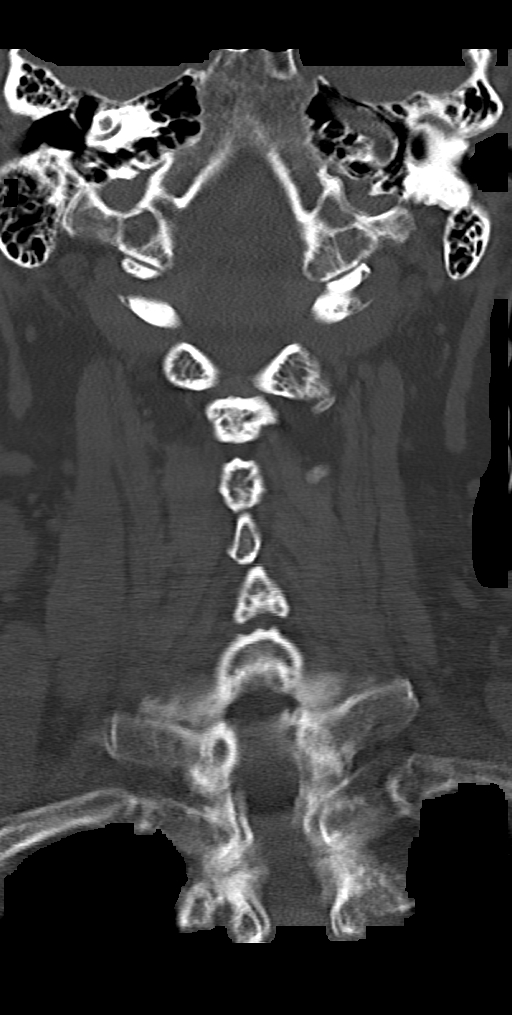
[im 41/55  bone]
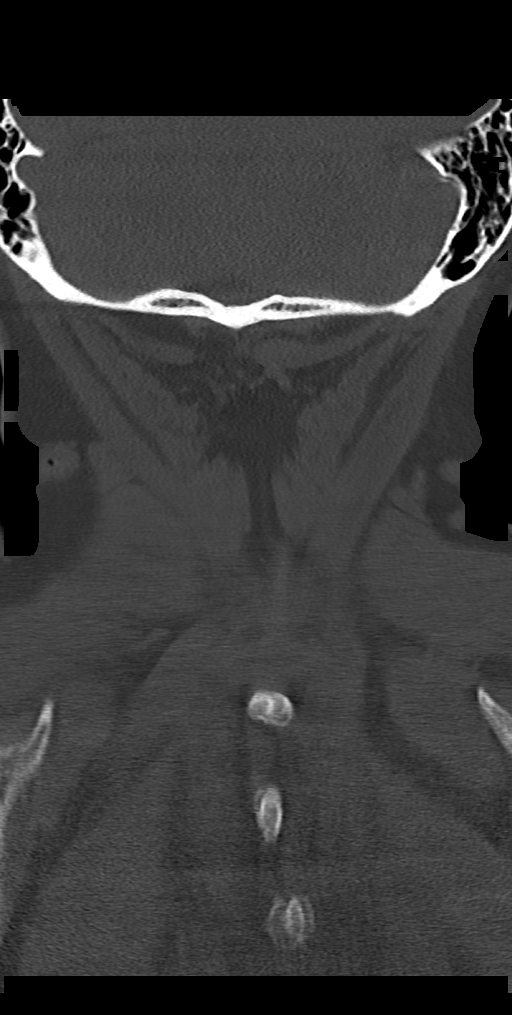

[Series 10: c_spine 1.0 st thins · axial · 0.29mm/px · z∈[+1206,+1286]mm · 2 of 268 slices shown, 3 images]
[im 77/268  soft-tissue]
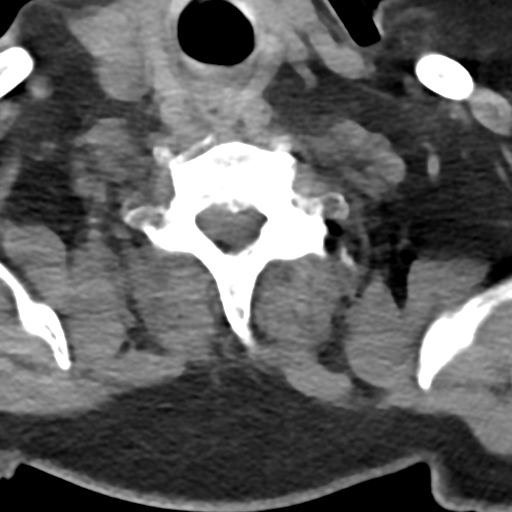
[im 77/268  bone]
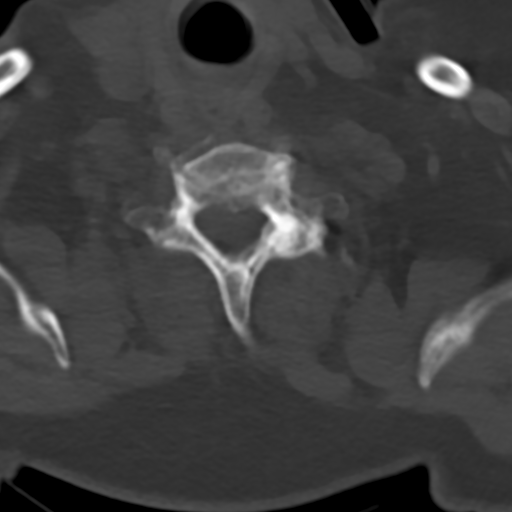
[im 191/268  bone]
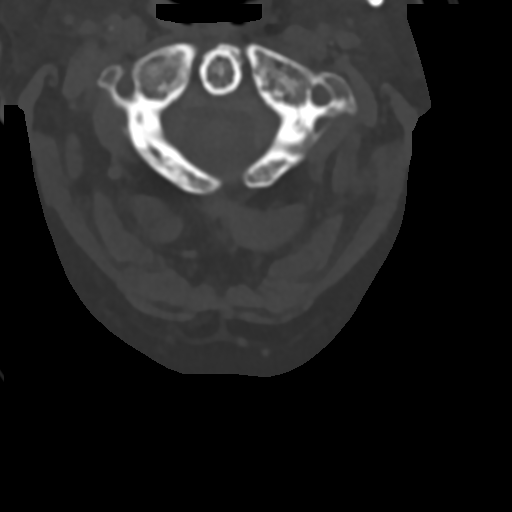

[10 of 33 positions shown; findings below may reference images not displayed]

FINDINGS: CT HEAD FINDINGS

Brain: No evidence of acute infarction, hemorrhage, hydrocephalus,
extra-axial collection or mass lesion/mass effect. Stable mild
chronic microvascular ischemic changes.

Vascular: No hyperdense vessel or unexpected calcification.

Skull: Normal. Negative for fracture or focal lesion.

Sinuses/Orbits: Pansinus mucosal thickening. Air-fluid level in the
right maxillary sinus. The mastoid air cells are clear. The orbits
are unremarkable.

Other: None.

CT CERVICAL SPINE FINDINGS

Alignment: Normal.

Skull base and vertebrae: No acute fracture. No primary bone lesion
or focal pathologic process. Congenital incomplete fusion of the C1
posterior arch.

Soft tissues and spinal canal: No prevertebral fluid or swelling. No
visible canal hematoma.

Disc levels: Severe disc height loss and advanced uncovertebral
hypertrophy from C4-C5 through C6-C7.

Upper chest: Mild emphysema.

Other: None.
IMPRESSION: 1.  No acute intracranial abnormality.
2.  No acute cervical spine fracture.

## 2021-01-17 IMAGING — DX DG CHEST 1V PORT
1 series · 1 of 1 positions shown · non-contrast
Comparison: 04/08/2006

CLINICAL DATA: Altered level of consciousness. Patient was found
down.

EXAM:
PORTABLE CHEST 1 VIEW

[chest ap]
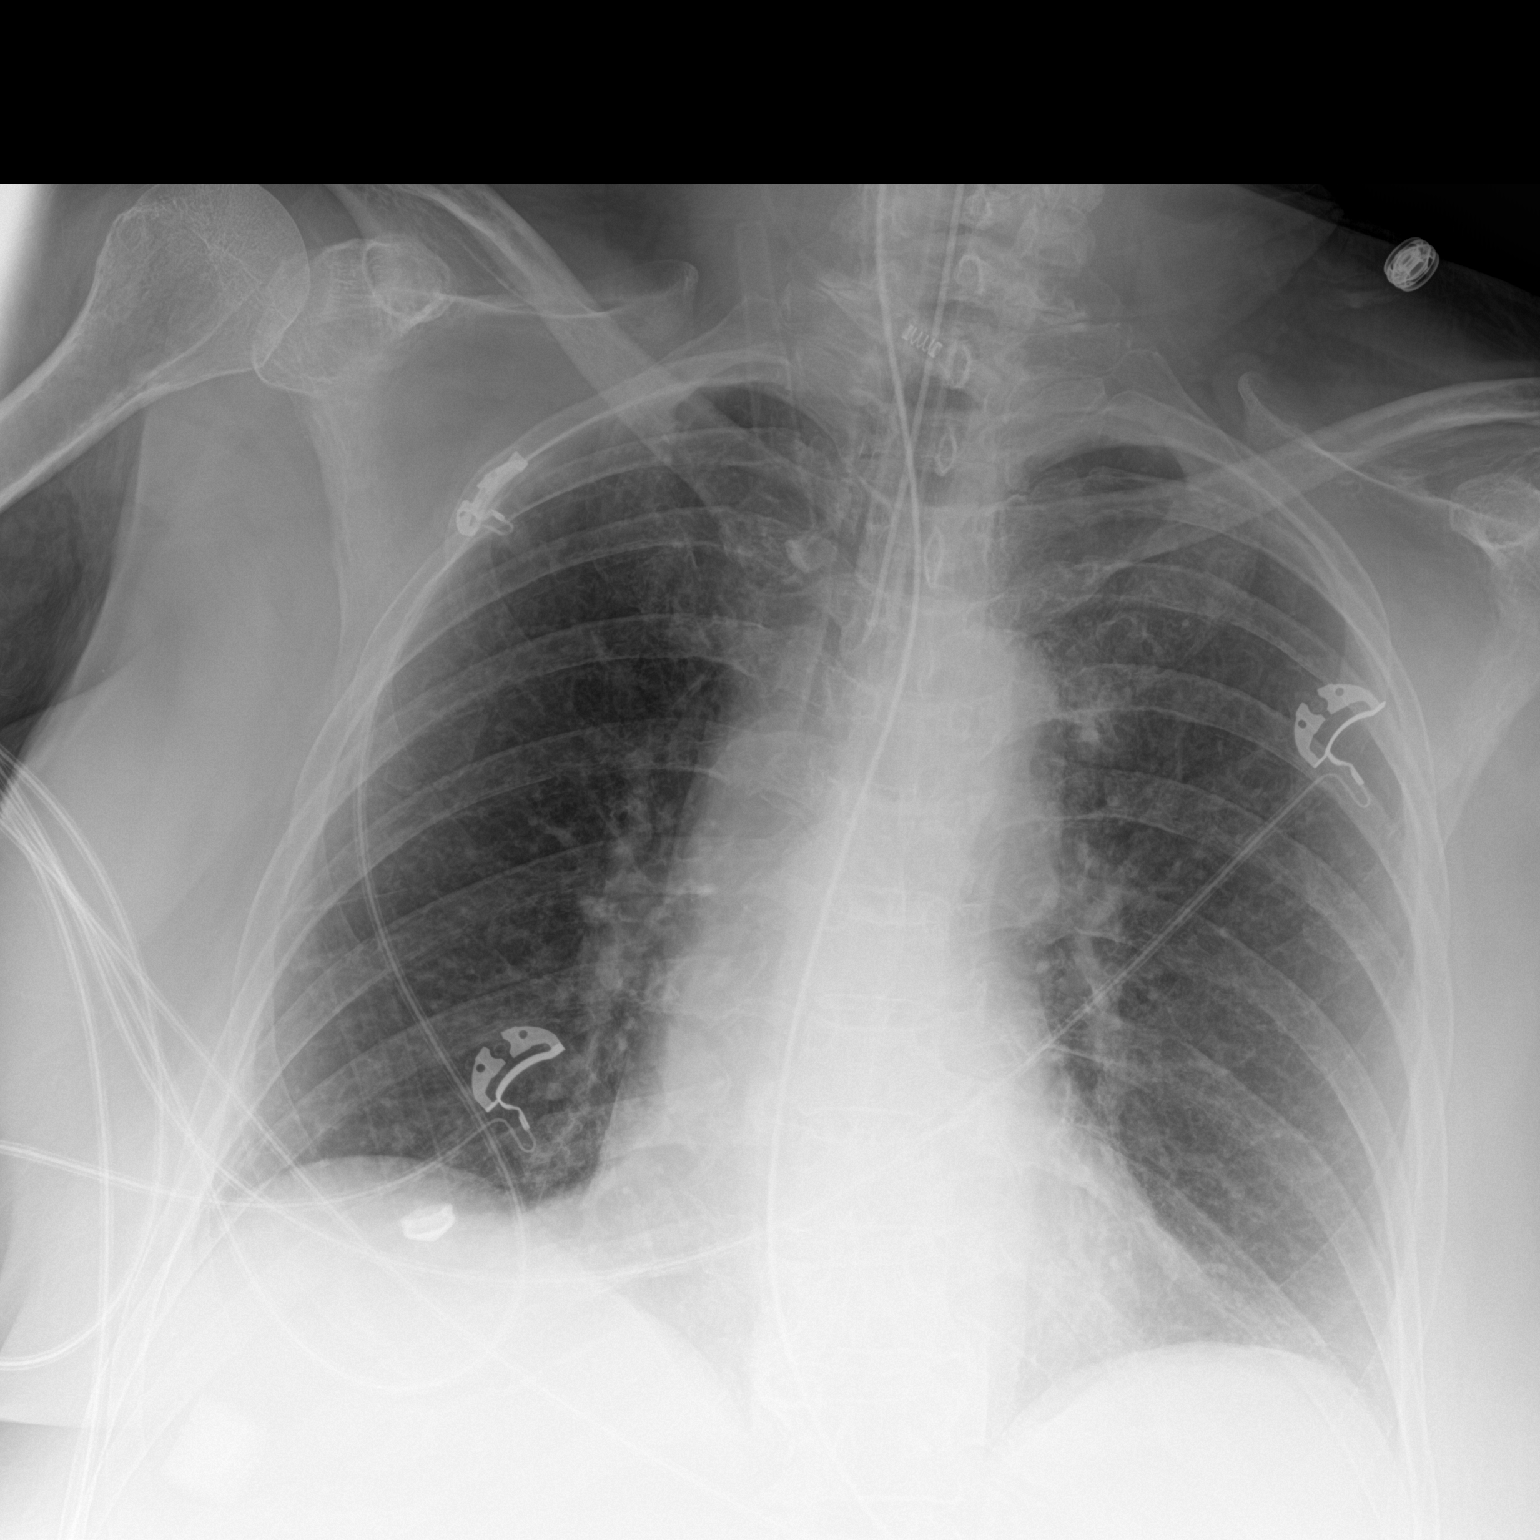

[1 of 1 positions shown; findings below may reference images not displayed]

FINDINGS: Endotracheal tube in good position 3 cm above the carina. NG tube
tip below the diaphragm.

Heart size and pulmonary vascularity are normal. Lungs are clear. No
bone abnormality.
IMPRESSION: Endotracheal tube in good position. Lungs are clear.

## 2021-01-18 IMAGING — DX DG ABDOMEN 1V
1 series · 1 of 1 positions shown · non-contrast
Comparison: None.

CLINICAL DATA: 73-year-old female enteric tube placement.

EXAM:
ABDOMEN - 1 VIEW

[abdomen kub]
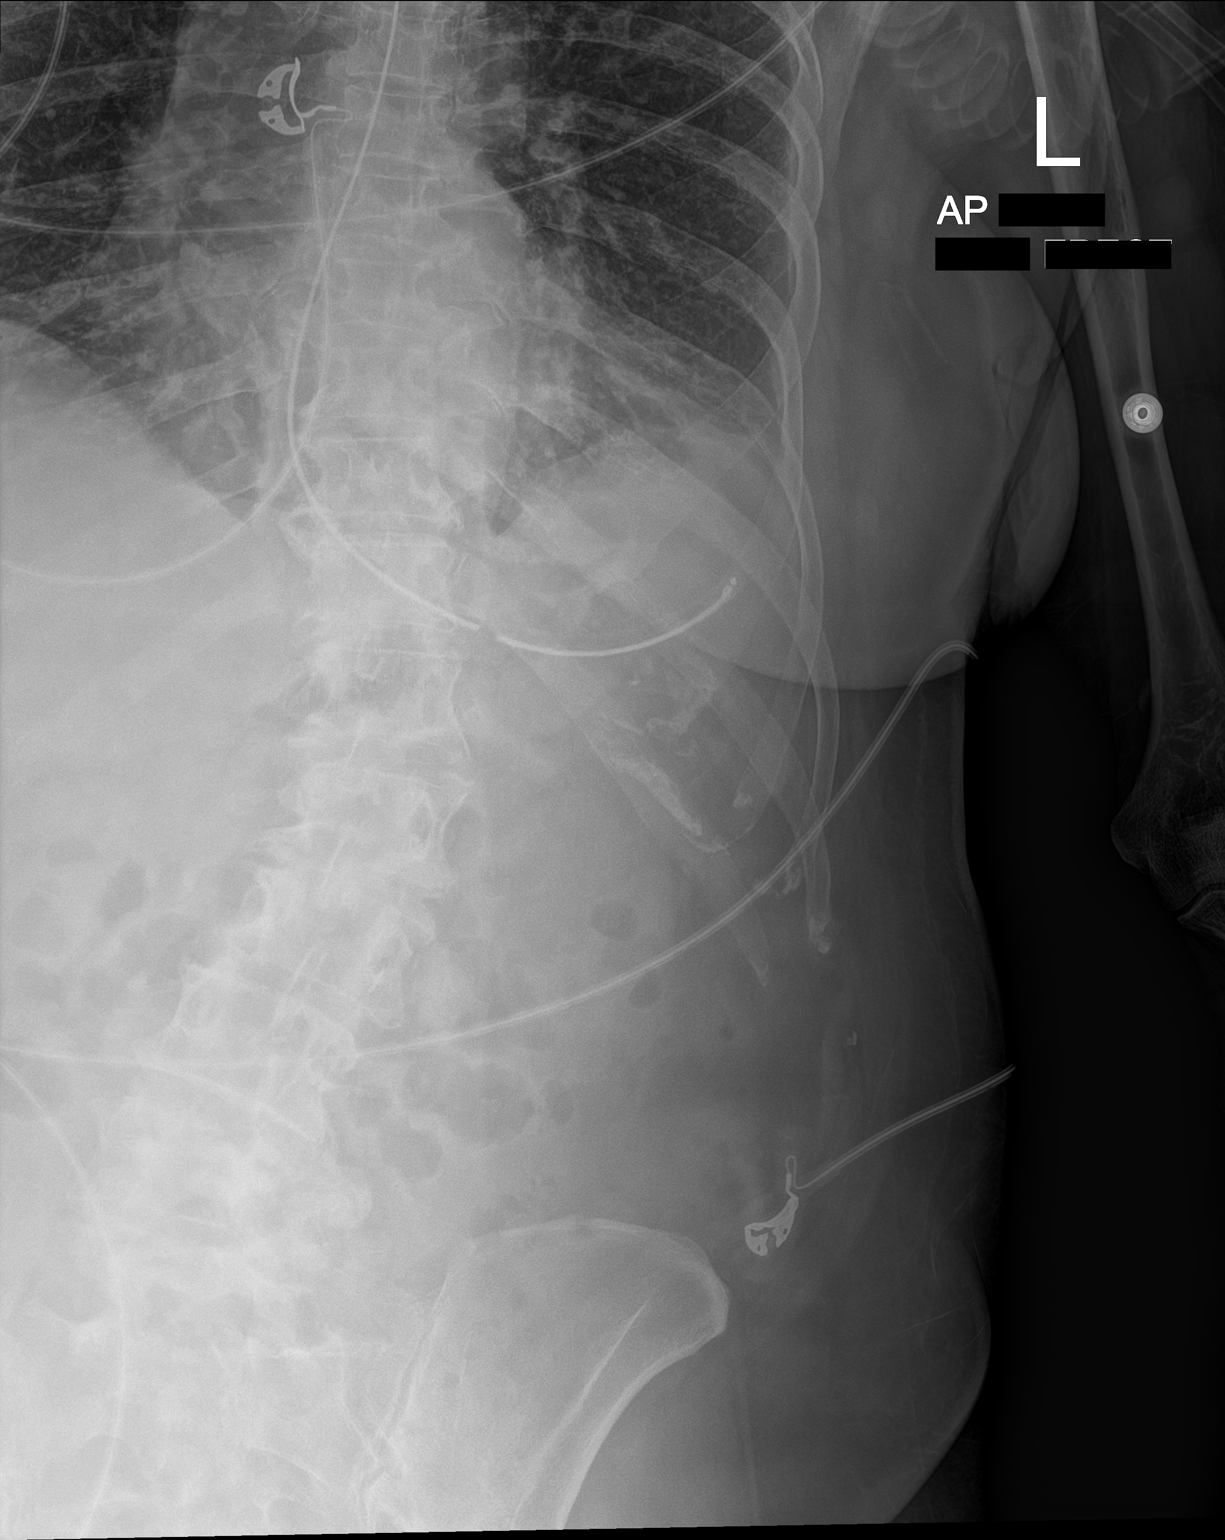

[1 of 1 positions shown; findings below may reference images not displayed]

FINDINGS: Portable AP semi upright view at 8439 hours. Enteric tube courses
into the stomach with side hole at the level of the gastric body.

Mild streaky opacity at the left lung base. Visible right lung base
appears negative. Visualized bowel gas pattern is non obstructed.
Scoliosis. No acute osseous abnormality identified.
IMPRESSION: Enteric tube placed into the stomach with side hole at the level of
the gastric body.

## 2021-01-18 IMAGING — DX DG CHEST 1V PORT
1 series · 1 of 1 positions shown · non-contrast
Comparison: 09/15/2019 portable chest.

CLINICAL DATA: 73-year-old female intubated after found down.

EXAM:
PORTABLE CHEST 1 VIEW

[chest ap]
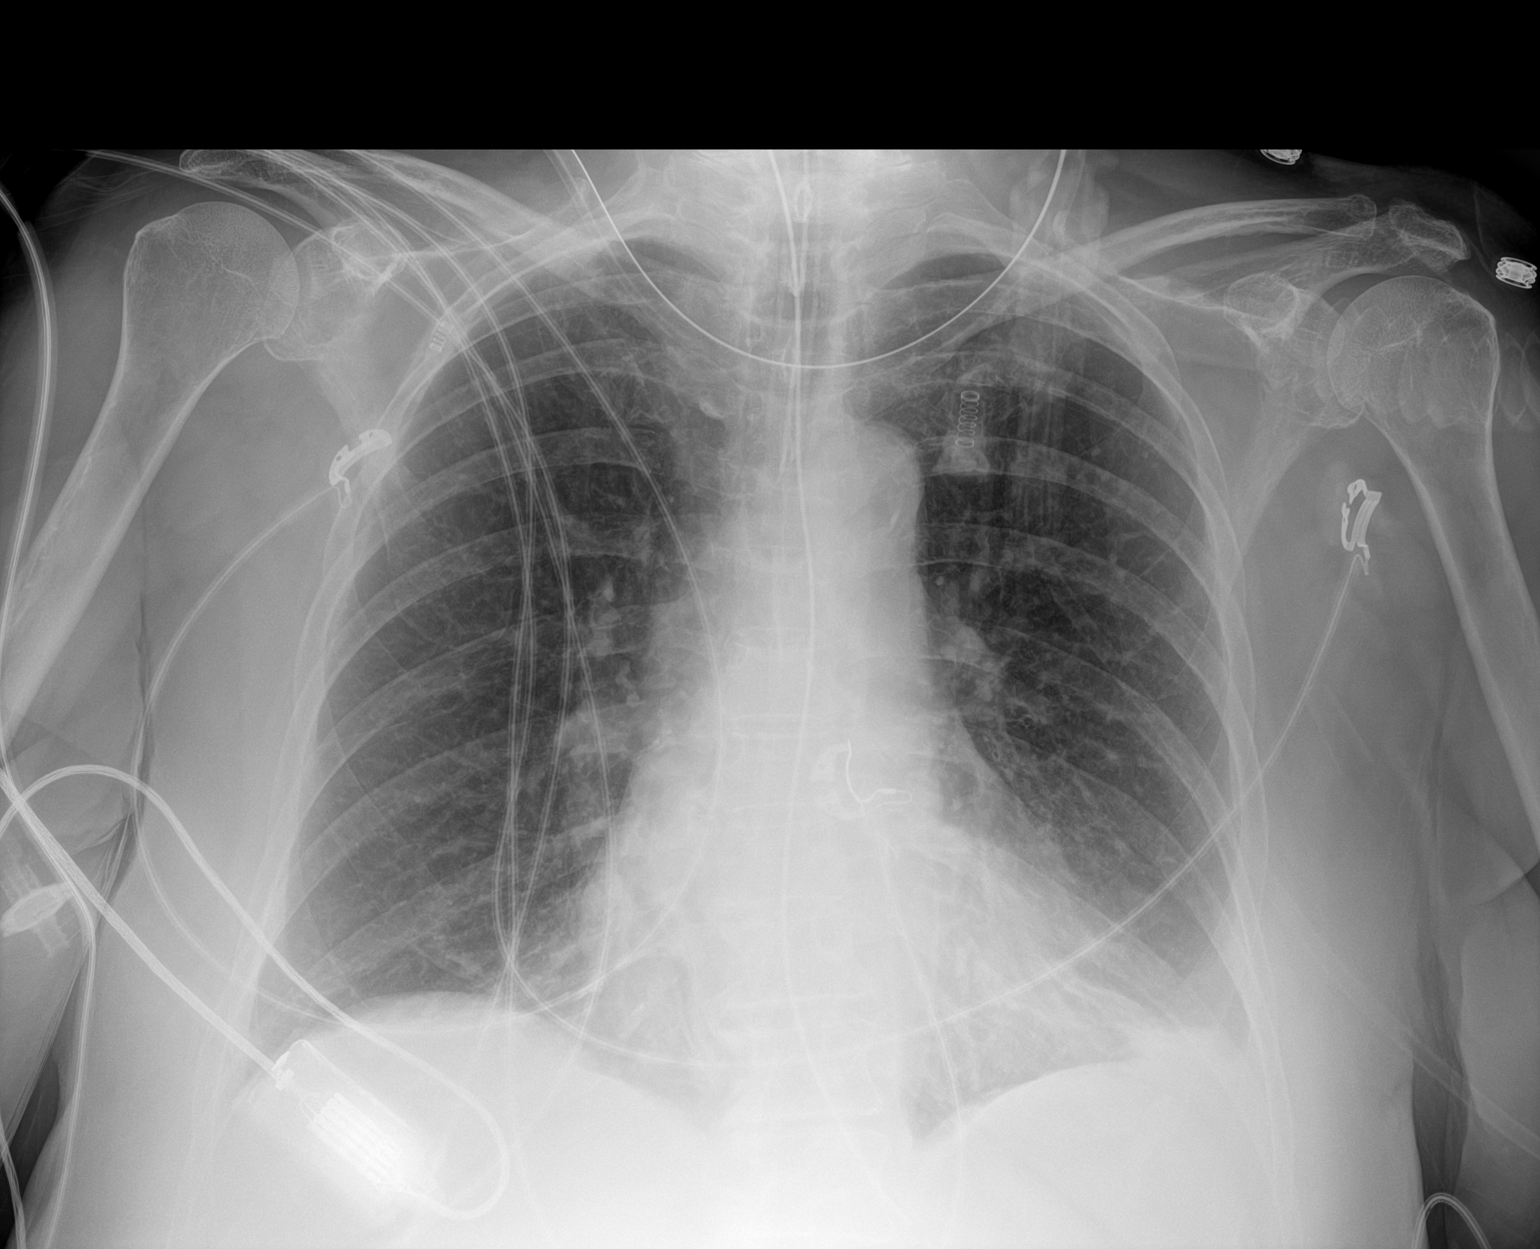

[1 of 1 positions shown; findings below may reference images not displayed]

FINDINGS: Portable AP upright view at 7600 hours. Endotracheal tube tip in
good position between the level the clavicles and carina. Enteric
tube courses to the abdomen.

Mildly lower lung volumes. Streaky left lung base opacity is new and
most resembles atelectasis. Elsewhere lungs remain clear allowing
for portable technique. Normal cardiac size and mediastinal
contours. No acute osseous abnormality identified.
IMPRESSION: 1.  Stable lines and tubes.
2. Left lung base atelectasis. No other acute cardiopulmonary
abnormality.

## 2021-05-08 ENCOUNTER — Other Ambulatory Visit: Payer: Self-pay | Admitting: Family Medicine

## 2021-05-08 ENCOUNTER — Other Ambulatory Visit: Payer: Self-pay | Admitting: *Deleted

## 2021-05-08 MED ORDER — ROSUVASTATIN CALCIUM 20 MG PO TABS: 20.0000 mg | ORAL_TABLET | Freq: Every day | ORAL | 0 refills | Status: DC

## 2021-05-08 MED ORDER — SERTRALINE HCL 50 MG PO TABS: ORAL_TABLET | ORAL | 0 refills | Status: DC

## 2021-05-08 MED ORDER — OMEGA-3-ACID ETHYL ESTERS 1 G PO CAPS: 1.0000 | ORAL_CAPSULE | Freq: Two times a day (BID) | ORAL | 0 refills | Status: DC

## 2021-05-08 MED ORDER — AMLODIPINE BESYLATE 10 MG PO TABS: 10.0000 mg | ORAL_TABLET | Freq: Every day | ORAL | 0 refills | Status: DC

## 2021-05-08 MED ORDER — HYDRALAZINE HCL 25 MG PO TABS: 25.0000 mg | ORAL_TABLET | Freq: Two times a day (BID) | ORAL | 0 refills | Status: DC

## 2021-05-08 MED ORDER — FUROSEMIDE 20 MG PO TABS: ORAL_TABLET | ORAL | 0 refills | Status: DC

## 2021-05-08 MED ORDER — OMEPRAZOLE 20 MG PO CPDR: DELAYED_RELEASE_CAPSULE | ORAL | 0 refills | Status: DC

## 2021-05-08 MED ORDER — BISOPROLOL FUMARATE 10 MG PO TABS: 10.0000 mg | ORAL_TABLET | Freq: Every day | ORAL | 0 refills | Status: DC

## 2021-05-09 ENCOUNTER — Other Ambulatory Visit: Payer: Self-pay | Admitting: Family Medicine

## 2021-05-17 ENCOUNTER — Other Ambulatory Visit: Payer: Self-pay

## 2021-05-17 ENCOUNTER — Encounter: Payer: Self-pay | Admitting: Nurse Practitioner

## 2021-05-17 ENCOUNTER — Ambulatory Visit: Payer: Medicare PPO | Admitting: Nurse Practitioner

## 2021-05-17 VITALS — BP 148/82 | HR 59 | Ht 65.0 in | Wt 154.0 lb

## 2021-05-17 DIAGNOSIS — D3501 Benign neoplasm of right adrenal gland: Secondary | ICD-10-CM

## 2021-05-17 DIAGNOSIS — N1831 Chronic kidney disease, stage 3a: Secondary | ICD-10-CM

## 2021-05-17 DIAGNOSIS — E559 Vitamin D deficiency, unspecified: Secondary | ICD-10-CM

## 2021-05-17 DIAGNOSIS — Z23 Encounter for immunization: Secondary | ICD-10-CM | POA: Diagnosis not present

## 2021-05-17 DIAGNOSIS — D3502 Benign neoplasm of left adrenal gland: Secondary | ICD-10-CM

## 2021-05-17 DIAGNOSIS — I6523 Occlusion and stenosis of bilateral carotid arteries: Secondary | ICD-10-CM

## 2021-05-17 DIAGNOSIS — E7800 Pure hypercholesterolemia, unspecified: Secondary | ICD-10-CM

## 2021-05-17 DIAGNOSIS — R011 Cardiac murmur, unspecified: Secondary | ICD-10-CM

## 2021-05-17 DIAGNOSIS — R7303 Prediabetes: Secondary | ICD-10-CM

## 2021-05-17 DIAGNOSIS — I10 Essential (primary) hypertension: Secondary | ICD-10-CM

## 2021-05-17 DIAGNOSIS — F325 Major depressive disorder, single episode, in full remission: Secondary | ICD-10-CM | POA: Diagnosis not present

## 2021-05-17 DIAGNOSIS — M8589 Other specified disorders of bone density and structure, multiple sites: Secondary | ICD-10-CM

## 2021-05-17 NOTE — Progress Notes (Signed)
Subjective:    Patient ID: Sherry Kent, female    DOB: 1945/12/21, 75 y.o.   MRN: [id number]  HPI: Sherry Kent is a 75 y.o. female presenting for follow up.  Chief Complaint  Patient presents with   New Patient (Initial Visit)    New patient , establish care , former pt of Dr Buelah Manis , no new concerns ,refills on medication   Had a fall when she was walking into cam[place] recently.  She bruised her right ribs and had a small pleural effusion on imaging in the urgent care.  She denies chest pain, shortness of breath, rib pain, fevers today. No other complaints from the fall.  HYPERTENSION / HYPERLIPIDEMIA Currently taking amlodipine 10 mg daily, bisoprolol 10 mg daily, Lasix 20 mg daily as needed for swelling, hydralazine 25 mg twice daily.  Currently taking rosuvastatin 20 mg daily, Lovaza 1 g twice daily for cholesterol.  Also taking baby aspirin. Satisfied with current treatment? yes Duration of hypertension: chronic BP monitoring frequency: not checking BP medication side effects: no Duration of hyperlipidemia: chronic Cholesterol medication side effects: no Recent stressors: no Recurrent headaches: no Visual changes: no Palpitations: no Dyspnea: no Chest pain: no Lower extremity edema: no Dizzy/lightheaded: no  CHRONIC KIDNEY DISEASE STAGE IIIA CKD status: stable Medications renally dose: yes Previous renal evaluation: no Pneumovax:  Up to Date Influenza Vaccine:  Up to Date  VITAMIN D DEFICIENCY Duration: chronic Previous Vitamin D level: 33 Current supplementation: [id number] u daily Complications of low vitamin d: osteopenia  History of osteopenia per bone density 2016.  Does not want to repeat this yet.   GERD Currently taking omeprazole 20 mg daily.  Reports this controls her symptoms well.  She does not have to use antacids or other medications to control her symptoms. Dysphagia: no Odynophagia:  no Hematemesis: no Blood in stool: no EGD: does not recall  having one in the past  DEPRESSION Reports symptoms are stable with sertraline 50 mg daily.  Denies SI/HI today.  No Known Allergies  Outpatient Encounter Medications as of 05/17/2021  Medication Sig   aspirin EC 81 MG tablet Take 81 mg by mouth daily.   Calcium Carbonate-Vit D-Min (CALCIUM 1200 PO) Take 1 tablet by mouth daily. In divided doses   multivitamin-iron-minerals-folic acid (CENTRUM) chewable tablet Chew 1 tablet by mouth daily.   omeprazole (PRILOSEC) 20 MG capsule TAKE 1 CAPSULE(20 MG) BY MOUTH DAILY   [DISCONTINUED] amLODipine (NORVASC) 10 MG tablet TAKE 1 TABLET(10 MG) BY MOUTH DAILY   [DISCONTINUED] bisoprolol (ZEBETA) 10 MG tablet TAKE 1 TABLET(10 MG) BY MOUTH DAILY   [DISCONTINUED] furosemide (LASIX) 20 MG tablet TAKE 1 TABLET BY MOUTH DAILY AS NEEDED FOR LEG SWELLING   [DISCONTINUED] hydrALAZINE (APRESOLINE) 25 MG tablet TAKE 1 TABLET(25 MG) BY MOUTH IN THE MORNING AND AT BEDTIME   [DISCONTINUED] omega-3 acid ethyl esters (LOVAZA) 1 g capsule TAKE 1 CAPSULE BY MOUTH TWICE DAILY   [DISCONTINUED] rosuvastatin (CRESTOR) 20 MG tablet TAKE 1 TABLET(20 MG) BY MOUTH DAILY   [DISCONTINUED] sertraline (ZOLOFT) 50 MG tablet TAKE 1 TABLET(50 MG) BY MOUTH DAILY   [DISCONTINUED] tiZANidine (ZANAFLEX) 4 MG tablet TAKE 1 TABLET(4 MG) BY MOUTH EVERY 6 HOURS AS NEEDED FOR MUSCLE SPASMS   [DISCONTINUED] traMADol (ULTRAM) 50 MG tablet TAKE 1 TABLET BY MOUTH EVERY 6 HOURS AS NEEDED   amLODipine (NORVASC) 10 MG tablet Take 1 tablet (10 mg total) by mouth daily.   bisoprolol (ZEBETA) 10 MG tablet Take  1 tablet (10 mg total) by mouth daily.   furosemide (LASIX) 20 MG tablet Take 1 tablet (20 mg total) by mouth daily as needed.   hydrALAZINE (APRESOLINE) 25 MG tablet Take 1 tablet (25 mg total) by mouth in the morning and at bedtime.   omega-3 acid ethyl esters (LOVAZA) 1 g capsule Take 1 capsule (1 g total) by mouth 2 (two) times daily.   rosuvastatin (CRESTOR) 20 MG tablet Take 1 tablet (20 mg  total) by mouth daily.   sertraline (ZOLOFT) 50 MG tablet Take 1 tablet (50 mg total) by mouth daily.   [DISCONTINUED] methylPREDNISolone (MEDROL DOSEPAK) 4 MG TBPK tablet Take as directed on package   No facility-administered encounter medications on file as of 05/17/2021.    Patient Active Problem List   Diagnosis Date Noted   Bilateral adrenal adenomas 0000000   Systolic murmur 0000000   Right renal mass 05/04/2020   FUO (fever of unknown origin) 01/20/2020   Airway compromise 09/15/2019   Chronic kidney disease, stage III (moderate) ([place]) 02/15/2016   Carotid artery stenosis 02/14/2016   Osteopenia 06/05/2015   Postmenopausal 06/05/2015   Estrogen deficiency 06/05/2015   Depression 05/27/2013   Prediabetes 05/27/2013   Hypertension    Hyperlipidemia    Panic attacks    History of colon polyps    H/O hematuria    Vitamin D deficiency     Past Medical History:  Diagnosis Date   Anxiety    Breast cancer screening 06/05/2015   H/O hematuria    History of colon polyps    Hyperlipidemia    Hypertension    Renal cell carcinoma ([place]) 05/04/2020    Relevant past medical, surgical, family and social history reviewed and updated as indicated. Interim medical history since our last visit reviewed.  Review of Systems Per HPI unless specifically indicated above     Objective:    BP (!) 148/82 (BP Location: Left Arm, Patient Position: Sitting, Cuff Size: Normal)   Pulse (!) 59   Ht '5\' 5"'$  (1.651 m)   Wt 154 lb (69.9 kg)   SpO2 96%   BMI 25.63 kg/m   Wt Readings from Last 3 Encounters:  05/17/21 154 lb (69.9 kg)  09/26/20 166 lb (75.3 kg)  08/07/20 170 lb (77.1 kg)    Physical Exam Vitals and nursing note reviewed.  Constitutional:      General: She is not in acute distress.    Appearance: Normal appearance. She is not toxic-appearing.  HENT:     Head: Normocephalic and atraumatic.  Eyes:     General: No scleral icterus.    Extraocular Movements: Extraocular  movements intact.  Neck:     Vascular: No carotid bruit.  Cardiovascular:     Rate and Rhythm: Normal rate and regular rhythm.     Heart sounds: Murmur heard.  Pulmonary:     Effort: Pulmonary effort is normal. No respiratory distress.     Breath sounds: Normal breath sounds. No wheezing, rhonchi or rales.  Abdominal:     General: Abdomen is flat. Bowel sounds are normal. There is no distension.     Palpations: Abdomen is soft.     Tenderness: There is no abdominal tenderness.  Musculoskeletal:        General: No swelling or tenderness. Normal range of motion.     Cervical back: Normal range of motion.     Right lower leg: No edema.     Left lower leg: No edema.  Skin:  General: Skin is warm and dry.     Capillary Refill: Capillary refill takes less than 2 seconds.     Coloration: Skin is not jaundiced.     Findings: No bruising.  Neurological:     General: No focal deficit present.     Mental Status: She is alert and oriented to person, place, and time.     Motor: No weakness.     Gait: Gait normal.  Psychiatric:        Mood and Affect: Mood normal.        Behavior: Behavior normal.        Thought Content: Thought content normal.        Judgment: Judgment normal.      Assessment & Plan:   Problem List Items Addressed This Visit       Cardiovascular and Mediastinum   Hypertension    Chronic.  Blood pressure is elevated slightly above goal today in clinic.  I have asked the patient to monitor her blood pressure at home.  Goal is less than 140/90 given age and comorbidities.  Continue current medications including amlodipine 10 mg, bisoprolol 10 mg, hydralazine 25 mg twice daily, and Lasix 20 mg daily as needed for swelling.  Check kidney function with electrolytes today.  Follow-up in 3 months.      Relevant Medications   rosuvastatin (CRESTOR) 20 MG tablet   omega-3 acid ethyl esters (LOVAZA) 1 g capsule   hydrALAZINE (APRESOLINE) 25 MG tablet   furosemide (LASIX)  20 MG tablet   bisoprolol (ZEBETA) 10 MG tablet   amLODipine (NORVASC) 10 MG tablet   Other Relevant Orders   COMPLETE METABOLIC PANEL WITH GFR (Completed)   CBC with Differential/Platelet (Completed)   Carotid artery stenosis    Per bilateral carotid duplex in 2016-overdue for repeat monitoring.  Will order today.  Patient is on high intensity statin and taking baby aspirin.  Follow-up pending results.  Follow-up pending results.      Relevant Medications   rosuvastatin (CRESTOR) 20 MG tablet   omega-3 acid ethyl esters (LOVAZA) 1 g capsule   hydrALAZINE (APRESOLINE) 25 MG tablet   furosemide (LASIX) 20 MG tablet   bisoprolol (ZEBETA) 10 MG tablet   amLODipine (NORVASC) 10 MG tablet   Other Relevant Orders   US Carotid Duplex Bilateral     Endocrine   Bilateral adrenal adenomas    Incidental findings on CT in 2021- saw urology shortly thereafter and recommended repeat CT in 1 year.  It appears she may be due for this.  Continue collaboration with urology.        Musculoskeletal and Integument   Osteopenia    Noted on DEXA scan in 2016.  She is due for repeat, however declines this today.  Discussed fall prevention to prevent future fractures and optimizing calcium and vitamin D intake in diet.  Follow-up 3 months.      Relevant Orders   VITAMIN D 25 Hydroxy (Vit-D Deficiency, Fractures) (Completed)     Genitourinary   Chronic kidney disease, stage III (moderate) ([facility]) - Primary    Chronic.  We will recheck kidney function today.  Medication list reviewed and medications appear to be renally dosed.  Blood pressure is slightly above our goal, patient has been in prediabetic range for a while.  Can consider consultation with nephrology if kidney function declines.  Follow-up 3 months.        Other   Vitamin D deficiency    Chronic.  Recheck vitamin D level today.  Plan to continue over-the-counter supplementation for now.      Relevant Orders   VITAMIN D 25 Hydroxy (Vit-D  Deficiency, Fractures) (Completed)   Systolic murmur    Obtain echocardiogram.  She is also overdue for bilateral carotid ultrasounds.  We will obtain these and follow-up pending results.      Relevant Orders   ECHOCARDIOGRAM COMPLETE   Prediabetes    Chronic.  Check hemoglobin A1c today.  Goal less than 7%.      Relevant Orders   Hemoglobin A1c (Completed)   Hyperlipidemia    Chronic.  Patient is stable on high intensity statin and daily baby aspirin.  We will check lipids today-patient is fasting.  We will also check liver enzymes.  Follow-up pending results.      Relevant Medications   rosuvastatin (CRESTOR) 20 MG tablet   omega-3 acid ethyl esters (LOVAZA) 1 g capsule   hydrALAZINE (APRESOLINE) 25 MG tablet   furosemide (LASIX) 20 MG tablet   bisoprolol (ZEBETA) 10 MG tablet   amLODipine (NORVASC) 10 MG tablet   Other Relevant Orders   CBC with Differential/Platelet (Completed)   Lipid panel (Completed)   Depression    Chronic.  PHQ-9 not completed today however PHQ 2 is zero.  Patient reports mood is stable with sertraline-plan to continue this at 50 mg daily and follow-up in 3 months.      Relevant Medications   sertraline (ZOLOFT) 50 MG tablet   Other Visit Diagnoses     Need for influenza vaccination       Relevant Orders   Flu vaccine HIGH DOSE PF (Fluzone High dose) (Completed)        Follow up plan: Return in about 3 months (around 08/16/2021) for follow up.

## 2021-05-18 LAB — HEMOGLOBIN A1C
Hgb A1c MFr Bld: 5.7 % of total Hgb — ABNORMAL HIGH (ref <5.7)
Mean Plasma Glucose: 117 mg/dL
eAG (mmol/L): 6.5 mmol/L

## 2021-05-18 LAB — CBC WITH DIFFERENTIAL/PLATELET
Absolute Monocytes: 565 cells/uL (ref 200–950)
Basophils Absolute: 57 cells/uL (ref 0–200)
Basophils Relative: 0.5 %
Eosinophils Absolute: 249 cells/uL (ref 15–500)
Eosinophils Relative: 2.2 %
HCT: 42.2 % (ref 35.0–45.0)
Hemoglobin: 13.6 g/dL (ref 11.7–15.5)
Lymphs Abs: 2384 cells/uL (ref 850–3900)
MCH: 26.3 pg — ABNORMAL LOW (ref 27.0–33.0)
MCHC: 32.2 g/dL (ref 32.0–36.0)
MCV: 81.6 fL (ref 80.0–100.0)
MPV: 9.8 fL (ref 7.5–12.5)
Monocytes Relative: 5 %
Neutro Abs: 8046 cells/uL — ABNORMAL HIGH (ref 1500–7800)
Neutrophils Relative %: 71.2 %
Platelets: 462 10*3/uL — ABNORMAL HIGH (ref 140–400)
RBC: 5.17 10*6/uL — ABNORMAL HIGH (ref 3.80–5.10)
RDW: 16.5 % — ABNORMAL HIGH (ref 11.0–15.0)
Total Lymphocyte: 21.1 %
WBC: 11.3 10*3/uL — ABNORMAL HIGH (ref 3.8–10.8)

## 2021-05-18 LAB — COMPLETE METABOLIC PANEL WITH GFR
AG Ratio: 1.6 (calc) (ref 1.0–2.5)
ALT: 7 U/L (ref 6–29)
AST: 10 U/L (ref 10–35)
Albumin: 4.6 g/dL (ref 3.6–5.1)
Alkaline phosphatase (APISO): 114 U/L (ref 37–153)
BUN/Creatinine Ratio: 16 (calc) (ref 6–22)
BUN: 20 mg/dL (ref 7–25)
CO2: 29 mmol/L (ref 20–32)
Calcium: 10.6 mg/dL — ABNORMAL HIGH (ref 8.6–10.4)
Chloride: 101 mmol/L (ref 98–110)
Creat: 1.28 mg/dL — ABNORMAL HIGH (ref 0.60–1.00)
Globulin: 2.9 g/dL (calc) (ref 1.9–3.7)
Glucose, Bld: 102 mg/dL — ABNORMAL HIGH (ref 65–99)
Potassium: 5 mmol/L (ref 3.5–5.3)
Sodium: 140 mmol/L (ref 135–146)
Total Bilirubin: 0.5 mg/dL (ref 0.2–1.2)
Total Protein: 7.5 g/dL (ref 6.1–8.1)
eGFR: 43 mL/min/{1.73_m2} — ABNORMAL LOW (ref >=60)

## 2021-05-18 LAB — LIPID PANEL
Cholesterol: 195 mg/dL (ref <200)
HDL: 58 mg/dL (ref >=50)
LDL Cholesterol (Calc): 108 mg/dL (calc) — ABNORMAL HIGH
Non-HDL Cholesterol (Calc): 137 mg/dL (calc) — ABNORMAL HIGH (ref <130)
Total CHOL/HDL Ratio: 3.4 (calc) (ref <5.0)
Triglycerides: 172 mg/dL — ABNORMAL HIGH (ref <150)

## 2021-05-18 LAB — VITAMIN D 25 HYDROXY (VIT D DEFICIENCY, FRACTURES): Vit D, 25-Hydroxy: 46 ng/mL (ref 30–100)

## 2021-05-22 MED ORDER — SERTRALINE HCL 50 MG PO TABS: 50.0000 mg | ORAL_TABLET | Freq: Every day | ORAL | 1 refills | Status: AC

## 2021-05-22 MED ORDER — OMEGA-3-ACID ETHYL ESTERS 1 G PO CAPS: 1.0000 | ORAL_CAPSULE | Freq: Two times a day (BID) | ORAL | 1 refills | Status: DC

## 2021-05-22 MED ORDER — AMLODIPINE BESYLATE 10 MG PO TABS: 10.0000 mg | ORAL_TABLET | Freq: Every day | ORAL | 1 refills | Status: DC

## 2021-05-22 MED ORDER — ROSUVASTATIN CALCIUM 20 MG PO TABS: 20.0000 mg | ORAL_TABLET | Freq: Every day | ORAL | 1 refills | Status: AC

## 2021-05-22 MED ORDER — FUROSEMIDE 20 MG PO TABS: 20.0000 mg | ORAL_TABLET | Freq: Every day | ORAL | 1 refills | Status: DC | PRN

## 2021-05-22 MED ORDER — BISOPROLOL FUMARATE 10 MG PO TABS: 10.0000 mg | ORAL_TABLET | Freq: Every day | ORAL | 1 refills | Status: AC

## 2021-05-22 MED ORDER — HYDRALAZINE HCL 25 MG PO TABS: 25.0000 mg | ORAL_TABLET | Freq: Two times a day (BID) | ORAL | 1 refills | Status: DC

## 2021-05-23 ENCOUNTER — Encounter: Payer: Self-pay | Admitting: Nurse Practitioner

## 2021-05-23 DIAGNOSIS — R011 Cardiac murmur, unspecified: Secondary | ICD-10-CM | POA: Insufficient documentation

## 2021-05-23 DIAGNOSIS — D3501 Benign neoplasm of right adrenal gland: Secondary | ICD-10-CM | POA: Insufficient documentation

## 2021-05-23 DIAGNOSIS — D3502 Benign neoplasm of left adrenal gland: Secondary | ICD-10-CM | POA: Insufficient documentation

## 2021-05-23 NOTE — Assessment & Plan Note (Signed)
Chronic.  Recheck vitamin D level today.  Plan to continue over-the-counter supplementation for now.

## 2021-05-23 NOTE — Assessment & Plan Note (Signed)
Obtain echocardiogram.  She is also overdue for bilateral carotid ultrasounds.  We will obtain these and follow-up pending results.

## 2021-05-23 NOTE — Addendum Note (Signed)
Addended by: Noemi Chapel A on: 05/23/2021 03:51 AM   Modules accepted: Orders

## 2021-05-23 NOTE — Assessment & Plan Note (Signed)
Per bilateral carotid duplex in 2016-overdue for repeat monitoring.  Will order today.  Patient is on high intensity statin and taking baby aspirin.  Follow-up pending results.  Follow-up pending results.

## 2021-05-23 NOTE — Assessment & Plan Note (Signed)
Chronic.  Blood pressure is elevated slightly above goal today in clinic.  I have asked the patient to monitor her blood pressure at home.  Goal is less than 140/90 given age and comorbidities.  Continue current medications including amlodipine 10 mg, bisoprolol 10 mg, hydralazine 25 mg twice daily, and Lasix 20 mg daily as needed for swelling.  Check kidney function with electrolytes today.  Follow-up in 3 months.

## 2021-05-23 NOTE — Assessment & Plan Note (Signed)
Chronic.  Patient is stable on high intensity statin and daily baby aspirin.  We will check lipids today-patient is fasting.  We will also check liver enzymes.  Follow-up pending results.

## 2021-05-23 NOTE — Assessment & Plan Note (Signed)
Chronic.  PHQ-9 not completed today however PHQ 2 is zero.  Patient reports mood is stable with sertraline-plan to continue this at 50 mg daily and follow-up in 3 months.

## 2021-05-23 NOTE — Assessment & Plan Note (Signed)
Chronic.  Check hemoglobin A1c today.  Goal less than 7%.

## 2021-05-23 NOTE — Assessment & Plan Note (Signed)
Incidental findings on CT in 2021- saw urology shortly thereafter and recommended repeat CT in 1 year.  It appears she may be due for this.  Continue collaboration with urology.

## 2021-05-23 NOTE — Assessment & Plan Note (Addendum)
Chronic.  We will recheck kidney function today.  Medication list reviewed and medications appear to be renally dosed.  Blood pressure is slightly above our goal, patient has been in prediabetic range for a while.  Can consider consultation with nephrology if kidney function declines.  Follow-up 3 months.

## 2021-05-23 NOTE — Assessment & Plan Note (Signed)
Noted on DEXA scan in 2016.  She is due for repeat, however declines this today.  Discussed fall prevention to prevent future fractures and optimizing calcium and vitamin D intake in diet.  Follow-up 3 months.

## 2021-05-30 ENCOUNTER — Encounter (HOSPITAL_COMMUNITY): Payer: Self-pay | Admitting: Nurse Practitioner

## 2021-06-07 ENCOUNTER — Other Ambulatory Visit: Payer: Self-pay | Admitting: Family Medicine

## 2021-06-07 DIAGNOSIS — I10 Essential (primary) hypertension: Secondary | ICD-10-CM

## 2021-06-07 DIAGNOSIS — E7800 Pure hypercholesterolemia, unspecified: Secondary | ICD-10-CM

## 2021-06-09 ENCOUNTER — Other Ambulatory Visit: Payer: Self-pay | Admitting: Family Medicine

## 2021-06-09 DIAGNOSIS — E7800 Pure hypercholesterolemia, unspecified: Secondary | ICD-10-CM

## 2021-06-13 ENCOUNTER — Telehealth (HOSPITAL_COMMUNITY): Payer: Self-pay | Admitting: Nurse Practitioner

## 2021-06-13 NOTE — Telephone Encounter (Signed)
Thank you for letting me know.  I will address at next visit.

## 2021-06-13 NOTE — Telephone Encounter (Signed)
Just an FYI. We have made several attempts to contact this patient including sending a letter to schedule or reschedule their echocardiogram. We will be removing the patient from the echo [place].  05/30/21 MAILED LETTER [facility]  05/30/21 Called and no vm set up @ 9:21am/[facility]  05/28/21 called and pt doe snot have VM set up @ 2:22/[facility]       Thank you

## 2021-06-28 ENCOUNTER — Other Ambulatory Visit: Payer: Self-pay | Admitting: Family Medicine

## 2021-06-28 DIAGNOSIS — I10 Essential (primary) hypertension: Secondary | ICD-10-CM

## 2021-08-01 ENCOUNTER — Other Ambulatory Visit: Payer: Self-pay | Admitting: Family Medicine

## 2021-11-10 ENCOUNTER — Other Ambulatory Visit: Payer: Self-pay | Admitting: Nurse Practitioner

## 2021-11-10 DIAGNOSIS — I10 Essential (primary) hypertension: Secondary | ICD-10-CM

## 2022-11-03 ENCOUNTER — Emergency Department (HOSPITAL_COMMUNITY): Payer: Medicare PPO

## 2022-11-03 ENCOUNTER — Emergency Department (HOSPITAL_COMMUNITY)
Admission: EM | Admit: 2022-11-03 | Discharge: 2022-11-03 | Disposition: A | Discharge status: Home / Self Care | Payer: Medicare PPO | Attending: Emergency Medicine | Admitting: Emergency Medicine

## 2022-11-03 ENCOUNTER — Other Ambulatory Visit: Payer: Self-pay

## 2022-11-03 ENCOUNTER — Encounter (HOSPITAL_COMMUNITY): Payer: Self-pay

## 2022-11-03 DIAGNOSIS — R410 Disorientation, unspecified: Secondary | ICD-10-CM | POA: Insufficient documentation

## 2022-11-03 DIAGNOSIS — Z1152 Encounter for screening for COVID-19: Secondary | ICD-10-CM | POA: Diagnosis not present

## 2022-11-03 DIAGNOSIS — W19XXXA Unspecified fall, initial encounter: Secondary | ICD-10-CM | POA: Diagnosis not present

## 2022-11-03 DIAGNOSIS — R531 Weakness: Secondary | ICD-10-CM | POA: Diagnosis not present

## 2022-11-03 DIAGNOSIS — S40012A Contusion of left shoulder, initial encounter: Secondary | ICD-10-CM | POA: Insufficient documentation

## 2022-11-03 DIAGNOSIS — Z7982 Long term (current) use of aspirin: Secondary | ICD-10-CM | POA: Insufficient documentation

## 2022-11-03 DIAGNOSIS — S0012XA Contusion of left eyelid and periocular area, initial encounter: Secondary | ICD-10-CM | POA: Insufficient documentation

## 2022-11-03 DIAGNOSIS — R739 Hyperglycemia, unspecified: Secondary | ICD-10-CM | POA: Diagnosis not present

## 2022-11-03 DIAGNOSIS — Z79899 Other long term (current) drug therapy: Secondary | ICD-10-CM | POA: Diagnosis not present

## 2022-11-03 DIAGNOSIS — S0083XA Contusion of other part of head, initial encounter: Secondary | ICD-10-CM | POA: Insufficient documentation

## 2022-11-03 DIAGNOSIS — G4489 Other headache syndrome: Secondary | ICD-10-CM | POA: Diagnosis not present

## 2022-11-03 DIAGNOSIS — I10 Essential (primary) hypertension: Secondary | ICD-10-CM | POA: Insufficient documentation

## 2022-11-03 DIAGNOSIS — R110 Nausea: Secondary | ICD-10-CM | POA: Diagnosis not present

## 2022-11-03 DIAGNOSIS — T1490XA Injury, unspecified, initial encounter: Secondary | ICD-10-CM | POA: Diagnosis not present

## 2022-11-03 DIAGNOSIS — M19012 Primary osteoarthritis, left shoulder: Secondary | ICD-10-CM | POA: Diagnosis not present

## 2022-11-03 DIAGNOSIS — R079 Chest pain, unspecified: Secondary | ICD-10-CM | POA: Diagnosis not present

## 2022-11-03 DIAGNOSIS — R1111 Vomiting without nausea: Secondary | ICD-10-CM | POA: Diagnosis not present

## 2022-11-03 DIAGNOSIS — R4182 Altered mental status, unspecified: Secondary | ICD-10-CM | POA: Diagnosis present

## 2022-11-03 LAB — URINALYSIS, ROUTINE W REFLEX MICROSCOPIC
Bilirubin Urine: NEGATIVE
Glucose, UA: 150 mg/dL — AB
Ketones, ur: 5 mg/dL — AB
Leukocytes,Ua: NEGATIVE
Nitrite: NEGATIVE
Protein, ur: >=300 mg/dL — AB
Specific Gravity, Urine: 1.025 (ref 1.005–1.030)
pH: 7 (ref 5.0–8.0)

## 2022-11-03 LAB — CBC
HCT: 43.8 % (ref 36.0–46.0)
Hemoglobin: 15.1 g/dL — ABNORMAL HIGH (ref 12.0–15.0)
MCH: 29.6 pg (ref 26.0–34.0)
MCHC: 34.5 g/dL (ref 30.0–36.0)
MCV: 85.9 fL (ref 80.0–100.0)
Platelets: 384 10*3/uL (ref 150–400)
RBC: 5.1 MIL/uL (ref 3.87–5.11)
RDW: 14.6 % (ref 11.5–15.5)
WBC: 17.2 10*3/uL — ABNORMAL HIGH (ref 4.0–10.5)
nRBC: 0 % (ref 0.0–0.2)

## 2022-11-03 LAB — COMPREHENSIVE METABOLIC PANEL
ALT: 17 U/L (ref 0–44)
AST: 33 U/L (ref 15–41)
Albumin: 4 g/dL (ref 3.5–5.0)
Alkaline Phosphatase: 78 U/L (ref 38–126)
Anion gap: 15 (ref 5–15)
BUN: 19 mg/dL (ref 8–23)
CO2: 20 mmol/L — ABNORMAL LOW (ref 22–32)
Calcium: 9.3 mg/dL (ref 8.9–10.3)
Chloride: 101 mmol/L (ref 98–111)
Creatinine, Ser: 1.36 mg/dL — ABNORMAL HIGH (ref 0.44–1.00)
GFR, Estimated: 40 mL/min — ABNORMAL LOW (ref >60)
Glucose, Bld: 237 mg/dL — ABNORMAL HIGH (ref 70–99)
Potassium: 4.2 mmol/L (ref 3.5–5.1)
Sodium: 136 mmol/L (ref 135–145)
Total Bilirubin: 1.2 mg/dL (ref 0.3–1.2)
Total Protein: 7.6 g/dL (ref 6.5–8.1)

## 2022-11-03 LAB — RESP PANEL BY RT-PCR (RSV, FLU A&B, COVID)  RVPGX2
Influenza A by PCR: NEGATIVE
Influenza B by PCR: NEGATIVE
Resp Syncytial Virus by PCR: NEGATIVE
SARS Coronavirus 2 by RT PCR: NEGATIVE

## 2022-11-03 LAB — CBG MONITORING, ED: Glucose-Capillary: 260 mg/dL — ABNORMAL HIGH (ref 70–99)

## 2022-11-03 MED ORDER — HYDRALAZINE HCL 25 MG PO TABS
25.0000 mg | ORAL_TABLET | Freq: Once | ORAL | Status: AC
  Administered 2022-11-03: 25 mg via ORAL
  Filled 2022-11-03: qty 1

## 2022-11-03 MED ORDER — LACTATED RINGERS IV BOLUS
1000.0000 mL | Freq: Once | INTRAVENOUS | Status: AC
  Administered 2022-11-03: 1000 mL via INTRAVENOUS

## 2022-11-03 NOTE — ED Notes (Signed)
RN attempted to walk pt in room. Pt started shivering all over. Nephew provided pt sweater and RN wrapped warm blanket around pt.

## 2022-11-03 NOTE — ED Triage Notes (Signed)
Pt bib ems from home d/t unwitnessed fall and AMS. Pt cousin initially found pt and informed nephew that something was off with pt. Cousin left home and nephew found pt. Nephew stated pt is usually independent at home and today she is Aox1 (self). Pt is not on thinners. Pt has a visible bruising on left eye and shoulder.   Pt had a small emesis in route. '4mg'$  Zofran administered.   BP 210/120 HR 100 CBG 269 RA 99%

## 2022-11-03 NOTE — ED Notes (Signed)
Patient transported to CT 

## 2022-11-03 NOTE — Discharge Instructions (Addendum)
Please return to the emergency department if you have worsening lightheadedness, confusion, falls, chest pain, or any other emergency medical concerns.  It was a pleasure taking care of you.

## 2022-11-03 NOTE — ED Provider Notes (Signed)
[facility] Provider Note   CSN: [id number] Arrival date & time: 11/03/22  1306     History  Chief Complaint  Patient presents with   Altered Mental Status   Fall    Sherry Kent is a 77 y.o. female.  Pt s/p suspected fall at home.  Family had called EMS when they noted pt with bruising about face and left shoulder. Pt not able to describe fall or what caused bruising, ?confusion - level 5 caveat.  No hx anticoagulant use. EMS indicates bp was high and cbg 269.  Pt denies any specific pain or c/o. Reportedly had c/o headache earlier. No report of fevers.  EMS does indicate pt was able to walk to her door to let nephew in her house prior to their arrival and was able to take a few steps to stretcher.   The history is provided by the patient, the EMS personnel and medical records. The history is limited by the condition of the patient.  Altered Mental Status Presenting symptoms: confusion   Associated symptoms: no abdominal pain, no fever and no headaches   Fall Pertinent negatives include no chest pain, no abdominal pain, no headaches and no shortness of breath.       Home Medications Prior to Admission medications   Medication Sig Start Date End Date Taking? Authorizing Provider  amLODipine (NORVASC) 10 MG tablet TAKE 1 TABLET(10 MG) BY MOUTH DAILY 06/28/21   Eulogio Bear, NP  aspirin EC 81 MG tablet Take 81 mg by mouth daily.    [provider]  bisoprolol (ZEBETA) 10 MG tablet Take 1 tablet (10 mg total) by mouth daily. 05/22/21   Eulogio Bear, NP  Calcium Carbonate-Vit D-Min (CALCIUM 1200 PO) Take 1 tablet by mouth daily. In divided doses    [provider]  furosemide (LASIX) 20 MG tablet TAKE 1 TABLET BY MOUTH DAILY AS NEEDED FOR LEG SWELLING 06/07/21   Noemi Chapel A, NP  hydrALAZINE (APRESOLINE) 25 MG tablet TAKE 1 TABLET(25 MG) BY MOUTH IN THE MORNING AND AT BEDTIME 11/12/21   Susy Frizzle, MD  multivitamin-iron-minerals-folic acid (CENTRUM) chewable tablet Chew 1 tablet by mouth daily. 03/07/20   [place], Modena Nunnery, MD  omega-3 acid ethyl esters (LOVAZA) 1 g capsule TAKE 1 CAPSULE BY MOUTH TWICE DAILY 06/11/21   Susy Frizzle, MD  omeprazole (PRILOSEC) 20 MG capsule TAKE 1 CAPSULE(20 MG) BY MOUTH DAILY 08/01/21   Susy Frizzle, MD  rosuvastatin (CRESTOR) 20 MG tablet Take 1 tablet (20 mg total) by mouth daily. 05/22/21   Eulogio Bear, NP  sertraline (ZOLOFT) 50 MG tablet Take 1 tablet (50 mg total) by mouth daily. 05/22/21   Eulogio Bear, NP      Allergies    Patient has no known allergies.    Review of Systems   Review of Systems  Constitutional:  Negative for fever.  Respiratory:  Negative for shortness of breath.   Cardiovascular:  Negative for chest pain.  Gastrointestinal:  Negative for abdominal pain.  Genitourinary:  Negative for dysuria.  Musculoskeletal:  Negative for back pain and neck pain.  Neurological:  Negative for headaches.  Psychiatric/Behavioral:  Positive for confusion.     Physical Exam Updated Vital Signs BP (!) 183/91   Pulse 80   Temp 98.4 F (36.9 C) (Oral)   Resp (!) 28   SpO2 91%  Physical Exam Vitals and nursing note reviewed.  Constitutional:      Appearance: Normal appearance. She is well-developed.  HENT:     Head:     Comments: Bruising about left eye and eye lid, and left forehead.     Nose: Nose normal.     Mouth/Throat:     Mouth: Mucous membranes are moist.  Eyes:     General: No scleral icterus.    Extraocular Movements: Extraocular movements intact.     Conjunctiva/sclera: Conjunctivae normal.     Pupils: Pupils are equal, round, and reactive to light.  Neck:     Vascular: No carotid bruit.     Trachea: No tracheal deviation.  Cardiovascular:     Rate and Rhythm: Normal rate and regular rhythm.     Pulses: Normal pulses.     Heart sounds: Normal heart sounds. No murmur heard.    No friction  rub. No gallop.  Pulmonary:     Effort: Pulmonary effort is normal. No respiratory distress.     Breath sounds: Normal breath sounds.  Chest:     Chest wall: No tenderness.  Abdominal:     General: Bowel sounds are normal. There is no distension.     Palpations: Abdomen is soft.     Tenderness: There is no abdominal tenderness. There is no guarding.     Comments: No abd contusion or bruising   Genitourinary:    Comments: No cva tenderness.  Musculoskeletal:        General: No swelling.     Cervical back: Normal range of motion and neck supple. No rigidity or tenderness. No muscular tenderness.     Comments: ?mid cervical tenderness, otherwise, CTLS spine, non tender, aligned, no step off. Tenderness left shoulder with associated bruising, otherwise good rom bil extremities without other pain or focal bony tenderness.   Skin:    General: Skin is warm and dry.     Findings: No rash.  Neurological:     Mental Status: She is alert.     Comments: Alert, speech not grossly dysarthric, but does appear confused. Motor/sens grossly intact bil.   Psychiatric:        Mood and Affect: Mood normal.     ED Results / Procedures / Treatments   Labs (all labs ordered are listed, but only abnormal results are displayed) Results for orders placed or performed during the hospital encounter of 11/03/22  CBC  Result Value Ref Range   WBC 17.2 (H) 4.0 - 10.5 K/uL   RBC 5.10 3.87 - 5.11 MIL/uL   Hemoglobin 15.1 (H) 12.0 - 15.0 g/dL   HCT 43.8 36.0 - 46.0 %   MCV 85.9 80.0 - 100.0 fL   MCH 29.6 26.0 - 34.0 pg   MCHC 34.5 30.0 - 36.0 g/dL   RDW 14.6 11.5 - 15.5 %   Platelets 384 150 - 400 K/uL   nRBC 0.0 0.0 - 0.2 %  Comprehensive metabolic panel  Result Value Ref Range   Sodium 136 135 - 145 mmol/L   Potassium 4.2 3.5 - 5.1 mmol/L   Chloride 101 98 - 111 mmol/L   CO2 20 (L) 22 - 32 mmol/L   Glucose, Bld 237 (H) 70 - 99 mg/dL   BUN 19 8 - 23 mg/dL   Creatinine, Ser 1.36 (H) 0.44 - 1.00  mg/dL   Calcium 9.3 8.9 - 10.3 mg/dL   Total Protein 7.6 6.5 - 8.1 g/dL   Albumin 4.0 3.5 - 5.0 g/dL   AST 33 15 -  41 U/L   ALT 17 0 - 44 U/L   Alkaline Phosphatase 78 38 - 126 U/L   Total Bilirubin 1.2 0.3 - 1.2 mg/dL   GFR, Estimated 40 (L) >60 mL/min   Anion gap 15 5 - 15  CBG monitoring, ED  Result Value Ref Range   Glucose-Capillary 260 (H) 70 - 99 mg/dL     EKG EKG Interpretation  Date/Time:  Sunday November 03 2022 13:29:39 EST Ventricular Rate:  77 PR Interval:  145 QRS Duration: 61 QT Interval:  488 QTC Calculation: 553 R Axis:   56 Text Interpretation: Sinus rhythm Nonspecific T wave abnormality Baseline wander Confirmed by Lajean Saver [phone]) on 11/03/2022 2:36:50 PM  Radiology CT Head Wo Contrast  Result Date: 11/03/2022 CLINICAL DATA:  Trauma EXAM: CT HEAD WITHOUT CONTRAST CT MAXILLOFACIAL WITHOUT CONTRAST CT CERVICAL SPINE WITHOUT CONTRAST TECHNIQUE: Multidetector CT imaging of the head, cervical spine, and maxillofacial structures were performed using the standard protocol without intravenous contrast. Multiplanar CT image reconstructions of the cervical spine and maxillofacial structures were also generated. RADIATION DOSE REDUCTION: This exam was performed according to the departmental dose-optimization program which includes automated exposure control, adjustment of the mA and/or kV according to patient size and/or use of iterative reconstruction technique. COMPARISON:  01/20/2020 FINDINGS: CT HEAD FINDINGS Brain: No evidence of acute infarction, hemorrhage, hydrocephalus, extra-axial collection or mass lesion/mass effect. Mild periventricular white matter hypodensity. Vascular: No hyperdense vessel or unexpected calcification. CT FACIAL BONES FINDINGS Skull: Normal. Negative for fracture or focal lesion. Facial bones: No displaced fractures or dislocations. Sinuses/Orbits: Bilateral maxillary sinus wall thickening, small air-fluid level within the left maxillary sinus,  and partial opacification of the ethmoid air cells. Other: Soft tissue contusion and hematoma of the left forehead and overlying the left orbit. CT CERVICAL SPINE FINDINGS Alignment: Normal. Skull base and vertebrae: No acute fracture. 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 No primary bone lesion or focal pathologic process. Soft tissues and spinal canal: No prevertebral fluid or swelling. No visible canal hematoma. Disc levels: Moderate to severe multilevel disc space height loss and osteophytosis, worst from C4 through C7. Upper chest: Negative. Other: None. IMPRESSION: 1. No acute intracranial pathology. Small-vessel white matter disease. 2. No displaced fractures or dislocations of the facial bones. 3. Soft tissue contusion and hematoma of the left forehead and overlying the left orbit. 4. Bilateral maxillary sinus wall thickening, small air-fluid level within the left maxillary sinus, and partial opacification of the ethmoid air cells. No fracture of the left maxillary sinuses identified to explain air-fluid level. 5. No fracture or static subluxation of the cervical spine. 6. Moderate to severe multilevel cervical disc degenerative disease. Electronically Signed   By: Delanna Ahmadi M.D.   On: 11/03/2022 14:27   CT Cervical Spine Wo Contrast  Result Date: 11/03/2022 CLINICAL DATA:  Trauma EXAM: CT HEAD WITHOUT CONTRAST CT MAXILLOFACIAL WITHOUT CONTRAST CT CERVICAL SPINE WITHOUT CONTRAST TECHNIQUE: Multidetector CT imaging of the head, cervical spine, and maxillofacial structures were performed using the standard protocol without intravenous contrast. Multiplanar CT image reconstructions of the cervical spine and maxillofacial structures were also generated. RADIATION DOSE REDUCTION: This exam was performed according to the departmental dose-optimization program which includes automated exposure control, adjustment of the mA and/or kV according to patient size and/or use of iterative  reconstruction technique. COMPARISON:  01/20/2020 FINDINGS: CT HEAD FINDINGS Brain: No evidence of acute infarction, hemorrhage, hydrocephalus, extra-axial collection or mass lesion/mass effect. Mild periventricular white matter hypodensity.  Vascular: No hyperdense vessel or unexpected calcification. CT FACIAL BONES FINDINGS Skull: Normal. Negative for fracture or focal lesion. Facial bones: No displaced fractures or dislocations. Sinuses/Orbits: Bilateral maxillary sinus wall thickening, small air-fluid level within the left maxillary sinus, and partial opacification of the ethmoid air cells. Other: Soft tissue contusion and hematoma of the left forehead and overlying the left orbit. CT CERVICAL SPINE FINDINGS Alignment: Normal. Skull base and vertebrae: No acute fracture. 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 No primary bone lesion or focal pathologic process. Soft tissues and spinal canal: No prevertebral fluid or swelling. No visible canal hematoma. Disc levels: Moderate to severe multilevel disc space height loss and osteophytosis, worst from C4 through C7. Upper chest: Negative. Other: None. IMPRESSION: 1. No acute intracranial pathology. Small-vessel white matter disease. 2. No displaced fractures or dislocations of the facial bones. 3. Soft tissue contusion and hematoma of the left forehead and overlying the left orbit. 4. Bilateral maxillary sinus wall thickening, small air-fluid level within the left maxillary sinus, and partial opacification of the ethmoid air cells. No fracture of the left maxillary sinuses identified to explain air-fluid level. 5. No fracture or static subluxation of the cervical spine. 6. Moderate to severe multilevel cervical disc degenerative disease. Electronically Signed   By: Delanna Ahmadi M.D.   On: 11/03/2022 14:27   CT Maxillofacial WO CM  Result Date: 11/03/2022 CLINICAL DATA:  Trauma EXAM: CT HEAD WITHOUT CONTRAST CT MAXILLOFACIAL WITHOUT CONTRAST  CT CERVICAL SPINE WITHOUT CONTRAST TECHNIQUE: Multidetector CT imaging of the head, cervical spine, and maxillofacial structures were performed using the standard protocol without intravenous contrast. Multiplanar CT image reconstructions of the cervical spine and maxillofacial structures were also generated. RADIATION DOSE REDUCTION: This exam was performed according to the departmental dose-optimization program which includes automated exposure control, adjustment of the mA and/or kV according to patient size and/or use of iterative reconstruction technique. COMPARISON:  01/20/2020 FINDINGS: CT HEAD FINDINGS Brain: No evidence of acute infarction, hemorrhage, hydrocephalus, extra-axial collection or mass lesion/mass effect. Mild periventricular white matter hypodensity. Vascular: No hyperdense vessel or unexpected calcification. CT FACIAL BONES FINDINGS Skull: Normal. Negative for fracture or focal lesion. Facial bones: No displaced fractures or dislocations. Sinuses/Orbits: Bilateral maxillary sinus wall thickening, small air-fluid level within the left maxillary sinus, and partial opacification of the ethmoid air cells. Other: Soft tissue contusion and hematoma of the left forehead and overlying the left orbit. CT CERVICAL SPINE FINDINGS Alignment: Normal. Skull base and vertebrae: No acute fracture. 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 No primary bone lesion or focal pathologic process. Soft tissues and spinal canal: No prevertebral fluid or swelling. No visible canal hematoma. Disc levels: Moderate to severe multilevel disc space height loss and osteophytosis, worst from C4 through C7. Upper chest: Negative. Other: None. IMPRESSION: 1. No acute intracranial pathology. Small-vessel white matter disease. 2. No displaced fractures or dislocations of the facial bones. 3. Soft tissue contusion and hematoma of the left forehead and overlying the left orbit. 4. Bilateral maxillary sinus  wall thickening, small air-fluid level within the left maxillary sinus, and partial opacification of the ethmoid air cells. No fracture of the left maxillary sinuses identified to explain air-fluid level. 5. No fracture or static subluxation of the cervical spine. 6. Moderate to severe multilevel cervical disc degenerative disease. Electronically Signed   By: Delanna Ahmadi M.D.   On: 11/03/2022 14:27   DG Chest 2 View  Result Date: 11/03/2022 CLINICAL DATA:  Fall.  Pain. EXAM: CHEST - 2 VIEW COMPARISON:  Chest x-ray Jan 20, 2020 FINDINGS: The heart size and mediastinal contours are within normal limits. Both lungs are clear. The visualized skeletal structures are unremarkable. IMPRESSION: No active cardiopulmonary disease. Electronically Signed   By: Dorise Bullion III M.D.   On: 11/03/2022 13:56   DG Shoulder Left  Result Date: 11/03/2022 CLINICAL DATA:  Pain EXAM: LEFT SHOULDER - 2+ VIEW COMPARISON:  None Available. FINDINGS: Mild glenohumeral degenerative changes. No fracture, dislocation, or other abnormality. IMPRESSION: Mild glenohumeral degenerative changes. Electronically Signed   By: Dorise Bullion III M.D.   On: 11/03/2022 13:55    Procedures Procedures    Medications Ordered in ED Medications  hydrALAZINE (APRESOLINE) tablet 25 mg (25 mg Oral Given 11/03/22 1540)  lactated ringers bolus 1,000 mL (1,000 mLs Intravenous New Bag/Given 11/03/22 1550)    ED Course/ Medical Decision Making/ A&P                             Medical Decision Making Problems Addressed: Accidental fall, initial encounter: acute illness or injury with systemic symptoms that poses a threat to life or bodily functions Confusion: acute illness or injury with systemic symptoms that poses a threat to life or bodily functions Contusion of face, initial encounter: acute illness or injury with systemic symptoms that poses a threat to life or bodily functions Contusion of left shoulder, initial encounter: acute  illness or injury Essential hypertension: chronic illness or injury with exacerbation, progression, or side effects of treatment that poses a threat to life or bodily functions General weakness: acute illness or injury with systemic symptoms that poses a threat to life or bodily functions  Amount and/or Complexity of Data Reviewed Independent Historian: EMS    Details: hx External Data Reviewed: notes. Labs: ordered. Decision-making details documented in ED Course. Radiology: ordered and independent interpretation performed. Decision-making details documented in ED Course. ECG/medicine tests: ordered and independent interpretation performed. Decision-making details documented in ED Course.  Risk Prescription drug management. Decision regarding hospitalization.   Iv ns. Continuous pulse ox and cardiac monitoring. Labs ordered/sent. Imaging ordered.   Differential diagnosis includes head injury, sdh, dehydration, aki, anemia, etc . Dispo decision including potential need for admission considered - will get labs and imaging and reassess.   Reviewed nursing notes and prior charts for additional history. External reports reviewed. Additional history from: EMS.   Cardiac monitor: sinus rhythm, rate 80.  Labs reviewed/interpreted by me - wbc elev. Ua pending.   Xrays reviewed/interpreted by me - no fx.   CT reviewed/interpreted by me - no hem. Degen changes.   Recheck, alert, oriented, indicates feels improved. Family now here, indicates seems currently like self/baseline, does note history of uti.   1530, ua/labs pending.  Signed out to Dr Langston Masker to check pending labs.   If UA positive would admit.  Otherwise will need to recheck, road test, and dispo appropriately.             Final Clinical Impression(s) / ED Diagnoses Final diagnoses:  None    Rx / DC Orders ED Discharge Orders     None         Lajean Saver, MD 11/03/22 [phone]

## 2022-11-03 NOTE — ED Provider Notes (Signed)
  This patient was received in signout from earlier provider.  Briefly is a 77 year old female who lives independently at home, who was found by family days today after reported fall and head injury.  Patient cannot recall the fall.  She denies any active pain at this time.  She had a hematoma over the right eye.  Physical Exam  BP (!) 157/109   Pulse 93   Temp 98.4 F (36.9 C) (Oral)   Resp 13   SpO2 (!) 86%   Physical Exam  Procedures  Procedures  ED Course / MDM    Medical Decision Making Amount and/or Complexity of Data Reviewed Labs: ordered. Radiology: ordered. ECG/medicine tests: ordered.  Risk Prescription drug management.   I reviewed the patient's labs and imaging.  CT imaging with no emergent findings.  No evidence of skull fracture or intracranial bleed.  UA without evidence of infection.  The patient does have a leukocytosis without clear origin, no fever or tachycardia suspect SIRS or sepsis.  No sign of pneumonia or infection in the lungs, or intra-abdominal cause of infection or inflammation.  No meningismus or concern for meningitis at this time.  The patient was stable and able to ambulate.  She was feeling much better after her fluids in the ED.  Her nephew was present at the bedside and feels that she is back to her baseline mental state.  I explained it is possible that she has a mild concussion from her head injury which can cause some time some retrograde amnesia.  Her nephew is comfortable taking her home and will be watching her at home to ensure that she is doing well.  If they have any further concerns about worsening confusion, fevers or chills, they will return to the ER.       Wyvonnia Dusky, MD 11/03/22 [phone]

## 2022-11-08 ENCOUNTER — Telehealth: Payer: Self-pay | Admitting: *Deleted

## 2022-11-08 NOTE — Telephone Encounter (Signed)
        Patient  visited [facility] 11/03/2022  for treatment   Telephone encounter attempt :  1sts  Message was left requesting a return call.  Instructed patient to call back at [phone] .  [facility] [phone] [address] , [place] [address] Email : [email or web address]

## 2022-11-19 ENCOUNTER — Telehealth: Payer: Self-pay | Admitting: *Deleted

## 2022-11-19 NOTE — Telephone Encounter (Signed)
        Patient  visited [facility] on 11/03/2022  for treatment    Telephone encounter attempt :  2nd  A HIPAA compliant voice message was left requesting a return call.  Instructed patient to call back at [phone].  [facility] [phone] [address] , [place] [address] Email : [email or web address]

## 2023-05-18 DIAGNOSIS — Z0389 Encounter for observation for other suspected diseases and conditions ruled out: Secondary | ICD-10-CM | POA: Diagnosis not present

## 2023-05-18 DIAGNOSIS — E278 Other specified disorders of adrenal gland: Secondary | ICD-10-CM | POA: Diagnosis not present

## 2023-05-18 DIAGNOSIS — I674 Hypertensive encephalopathy: Secondary | ICD-10-CM | POA: Diagnosis not present

## 2023-05-18 DIAGNOSIS — R4182 Altered mental status, unspecified: Secondary | ICD-10-CM | POA: Diagnosis not present

## 2023-05-18 DIAGNOSIS — N179 Acute kidney failure, unspecified: Secondary | ICD-10-CM | POA: Diagnosis not present

## 2023-05-18 DIAGNOSIS — I1A0 Resistant hypertension: Secondary | ICD-10-CM | POA: Diagnosis not present

## 2023-05-18 DIAGNOSIS — R2689 Other abnormalities of gait and mobility: Secondary | ICD-10-CM | POA: Diagnosis not present

## 2023-05-18 DIAGNOSIS — E785 Hyperlipidemia, unspecified: Secondary | ICD-10-CM | POA: Diagnosis not present

## 2023-05-18 DIAGNOSIS — Z91148 Patient's other noncompliance with medication regimen for other reason: Secondary | ICD-10-CM | POA: Diagnosis not present

## 2023-05-18 DIAGNOSIS — R471 Dysarthria and anarthria: Secondary | ICD-10-CM | POA: Diagnosis not present

## 2023-05-18 DIAGNOSIS — Z87891 Personal history of nicotine dependence: Secondary | ICD-10-CM | POA: Diagnosis not present

## 2023-05-18 DIAGNOSIS — I10 Essential (primary) hypertension: Secondary | ICD-10-CM | POA: Diagnosis not present

## 2023-05-18 DIAGNOSIS — R29818 Other symptoms and signs involving the nervous system: Secondary | ICD-10-CM | POA: Diagnosis not present

## 2023-05-18 DIAGNOSIS — R61 Generalized hyperhidrosis: Secondary | ICD-10-CM | POA: Diagnosis not present

## 2023-05-18 DIAGNOSIS — R1901 Right upper quadrant abdominal swelling, mass and lump: Secondary | ICD-10-CM | POA: Diagnosis not present

## 2023-05-19 DIAGNOSIS — E278 Other specified disorders of adrenal gland: Secondary | ICD-10-CM | POA: Diagnosis not present

## 2023-05-19 DIAGNOSIS — I674 Hypertensive encephalopathy: Secondary | ICD-10-CM | POA: Diagnosis not present

## 2023-05-19 DIAGNOSIS — I1A0 Resistant hypertension: Secondary | ICD-10-CM | POA: Diagnosis not present

## 2023-05-19 DIAGNOSIS — R471 Dysarthria and anarthria: Secondary | ICD-10-CM | POA: Diagnosis not present

## 2023-05-19 DIAGNOSIS — N179 Acute kidney failure, unspecified: Secondary | ICD-10-CM | POA: Diagnosis not present

## 2023-05-19 DIAGNOSIS — R2689 Other abnormalities of gait and mobility: Secondary | ICD-10-CM | POA: Diagnosis not present

## 2023-05-19 DIAGNOSIS — R0600 Dyspnea, unspecified: Secondary | ICD-10-CM | POA: Diagnosis not present

## 2023-05-19 DIAGNOSIS — Z87891 Personal history of nicotine dependence: Secondary | ICD-10-CM | POA: Diagnosis not present

## 2023-05-19 DIAGNOSIS — E785 Hyperlipidemia, unspecified: Secondary | ICD-10-CM | POA: Diagnosis not present

## 2023-05-19 DIAGNOSIS — Z91148 Patient's other noncompliance with medication regimen for other reason: Secondary | ICD-10-CM | POA: Diagnosis not present

## 2023-05-20 DIAGNOSIS — N179 Acute kidney failure, unspecified: Secondary | ICD-10-CM | POA: Diagnosis not present

## 2023-05-21 DIAGNOSIS — N179 Acute kidney failure, unspecified: Secondary | ICD-10-CM | POA: Diagnosis not present

## 2023-05-22 DIAGNOSIS — N179 Acute kidney failure, unspecified: Secondary | ICD-10-CM | POA: Diagnosis not present

## 2023-07-01 DIAGNOSIS — I10 Essential (primary) hypertension: Secondary | ICD-10-CM | POA: Diagnosis not present

## 2023-07-01 DIAGNOSIS — E785 Hyperlipidemia, unspecified: Secondary | ICD-10-CM | POA: Diagnosis not present

## 2023-07-01 DIAGNOSIS — S0012XA Contusion of left eyelid and periocular area, initial encounter: Secondary | ICD-10-CM | POA: Diagnosis not present

## 2023-07-01 DIAGNOSIS — W19XXXA Unspecified fall, initial encounter: Secondary | ICD-10-CM | POA: Diagnosis not present

## 2023-07-21 DIAGNOSIS — E785 Hyperlipidemia, unspecified: Secondary | ICD-10-CM | POA: Diagnosis not present

## 2023-07-21 DIAGNOSIS — Z23 Encounter for immunization: Secondary | ICD-10-CM | POA: Diagnosis not present

## 2023-07-21 DIAGNOSIS — Z0000 Encounter for general adult medical examination without abnormal findings: Secondary | ICD-10-CM | POA: Diagnosis not present

## 2023-07-21 DIAGNOSIS — I10 Essential (primary) hypertension: Secondary | ICD-10-CM | POA: Diagnosis not present

## 2023-07-21 DIAGNOSIS — Z1331 Encounter for screening for depression: Secondary | ICD-10-CM | POA: Diagnosis not present
